# Patient Record
Sex: Female | Born: 1945
Health system: Southern US, Community
[De-identification: ages and names within clinical notes are randomized; demographics above are authoritative.]

## PROBLEM LIST (undated history)

## (undated) DIAGNOSIS — F32A Depression, unspecified: Secondary | ICD-10-CM

## (undated) DIAGNOSIS — J40 Bronchitis, not specified as acute or chronic: Secondary | ICD-10-CM

## (undated) DIAGNOSIS — J449 Chronic obstructive pulmonary disease, unspecified: Secondary | ICD-10-CM

## (undated) DIAGNOSIS — F419 Anxiety disorder, unspecified: Secondary | ICD-10-CM

## (undated) DIAGNOSIS — I10 Essential (primary) hypertension: Secondary | ICD-10-CM

## (undated) DIAGNOSIS — T675XXA Heat exhaustion, unspecified, initial encounter: Secondary | ICD-10-CM

## (undated) DIAGNOSIS — E785 Hyperlipidemia, unspecified: Secondary | ICD-10-CM

## (undated) DIAGNOSIS — D649 Anemia, unspecified: Secondary | ICD-10-CM

## (undated) DIAGNOSIS — F329 Major depressive disorder, single episode, unspecified: Secondary | ICD-10-CM

## (undated) HISTORY — DX: Major depressive disorder, single episode, unspecified: F32.9

## (undated) HISTORY — DX: Depression, unspecified: F32.A

## (undated) HISTORY — DX: Anxiety disorder, unspecified: F41.9

## (undated) HISTORY — PX: TONSILLECTOMY: SUR1361

## (undated) HISTORY — PX: CHOLECYSTECTOMY: SHX55

## (undated) HISTORY — PX: MULTIPLE TOOTH EXTRACTIONS: SHX2053

## (undated) HISTORY — DX: Hyperlipidemia, unspecified: E78.5

---

## 1998-09-03 ENCOUNTER — Encounter: Admission: RE | Admit: 1998-09-03 | Discharge: 1998-09-03 | Payer: Self-pay | Admitting: Internal Medicine

## 1998-10-04 ENCOUNTER — Other Ambulatory Visit: Admission: RE | Admit: 1998-10-04 | Discharge: 1998-10-04 | Payer: Self-pay | Admitting: Obstetrics

## 1998-10-04 ENCOUNTER — Encounter: Admission: RE | Admit: 1998-10-04 | Discharge: 1998-10-04 | Payer: Self-pay | Admitting: Obstetrics & Gynecology

## 1998-10-10 ENCOUNTER — Encounter: Payer: Self-pay | Admitting: Obstetrics & Gynecology

## 1998-10-10 ENCOUNTER — Ambulatory Visit (HOSPITAL_COMMUNITY): Admission: RE | Admit: 1998-10-10 | Discharge: 1998-10-10 | Payer: Self-pay | Admitting: Obstetrics & Gynecology

## 1998-10-14 ENCOUNTER — Ambulatory Visit (HOSPITAL_COMMUNITY): Admission: RE | Admit: 1998-10-14 | Discharge: 1998-10-14 | Payer: Self-pay

## 1998-10-25 ENCOUNTER — Other Ambulatory Visit: Admission: RE | Admit: 1998-10-25 | Discharge: 1998-10-25 | Payer: Self-pay | Admitting: Obstetrics & Gynecology

## 1998-10-25 ENCOUNTER — Encounter: Admission: RE | Admit: 1998-10-25 | Discharge: 1998-10-25 | Payer: Self-pay | Admitting: Obstetrics & Gynecology

## 1999-01-22 ENCOUNTER — Encounter: Admission: RE | Admit: 1999-01-22 | Discharge: 1999-01-22 | Payer: Self-pay | Admitting: Internal Medicine

## 1999-03-14 ENCOUNTER — Encounter: Admission: RE | Admit: 1999-03-14 | Discharge: 1999-03-14 | Payer: Self-pay | Admitting: Internal Medicine

## 1999-04-09 ENCOUNTER — Encounter: Admission: RE | Admit: 1999-04-09 | Discharge: 1999-04-09 | Payer: Self-pay | Admitting: Internal Medicine

## 1999-07-26 ENCOUNTER — Emergency Department (HOSPITAL_COMMUNITY): Admission: EM | Admit: 1999-07-26 | Discharge: 1999-07-26 | Payer: Self-pay | Admitting: Emergency Medicine

## 1999-09-28 ENCOUNTER — Emergency Department (HOSPITAL_COMMUNITY): Admission: EM | Admit: 1999-09-28 | Discharge: 1999-09-28 | Payer: Self-pay | Admitting: Emergency Medicine

## 1999-10-10 ENCOUNTER — Ambulatory Visit (HOSPITAL_COMMUNITY): Admission: RE | Admit: 1999-10-10 | Discharge: 1999-10-10 | Payer: Self-pay | Admitting: Internal Medicine

## 1999-10-10 ENCOUNTER — Encounter: Admission: RE | Admit: 1999-10-10 | Discharge: 1999-10-10 | Payer: Self-pay | Admitting: Internal Medicine

## 1999-10-10 ENCOUNTER — Encounter: Payer: Self-pay | Admitting: Internal Medicine

## 1999-10-29 ENCOUNTER — Ambulatory Visit (HOSPITAL_COMMUNITY): Admission: RE | Admit: 1999-10-29 | Discharge: 1999-10-29 | Payer: Self-pay | Admitting: *Deleted

## 2000-03-11 ENCOUNTER — Other Ambulatory Visit: Admission: RE | Admit: 2000-03-11 | Discharge: 2000-03-11 | Payer: Self-pay | Admitting: Obstetrics & Gynecology

## 2000-03-11 ENCOUNTER — Encounter: Admission: RE | Admit: 2000-03-11 | Discharge: 2000-03-11 | Payer: Self-pay | Admitting: Internal Medicine

## 2000-03-15 ENCOUNTER — Ambulatory Visit (HOSPITAL_COMMUNITY): Admission: RE | Admit: 2000-03-15 | Discharge: 2000-03-15 | Payer: Self-pay | Admitting: Internal Medicine

## 2000-03-25 ENCOUNTER — Encounter: Payer: Self-pay | Admitting: Internal Medicine

## 2000-03-25 ENCOUNTER — Ambulatory Visit (HOSPITAL_COMMUNITY): Admission: RE | Admit: 2000-03-25 | Discharge: 2000-03-25 | Payer: Self-pay | Admitting: Internal Medicine

## 2000-04-14 ENCOUNTER — Encounter: Admission: RE | Admit: 2000-04-14 | Discharge: 2000-04-14 | Payer: Self-pay | Admitting: Hematology and Oncology

## 2000-04-21 ENCOUNTER — Ambulatory Visit (HOSPITAL_COMMUNITY): Admission: RE | Admit: 2000-04-21 | Discharge: 2000-04-21 | Payer: Self-pay | Admitting: *Deleted

## 2000-04-30 ENCOUNTER — Encounter: Admission: RE | Admit: 2000-04-30 | Discharge: 2000-04-30 | Payer: Self-pay | Admitting: Internal Medicine

## 2000-06-04 ENCOUNTER — Encounter: Admission: RE | Admit: 2000-06-04 | Discharge: 2000-06-04 | Payer: Self-pay | Admitting: Internal Medicine

## 2000-08-17 ENCOUNTER — Encounter: Admission: RE | Admit: 2000-08-17 | Discharge: 2000-08-17 | Payer: Self-pay | Admitting: Internal Medicine

## 2000-09-22 ENCOUNTER — Encounter: Admission: RE | Admit: 2000-09-22 | Discharge: 2000-09-22 | Payer: Self-pay | Admitting: Internal Medicine

## 2000-11-01 ENCOUNTER — Encounter: Admission: RE | Admit: 2000-11-01 | Discharge: 2000-11-01 | Payer: Self-pay | Admitting: Infectious Diseases

## 2000-11-17 ENCOUNTER — Ambulatory Visit (HOSPITAL_COMMUNITY): Admission: RE | Admit: 2000-11-17 | Discharge: 2000-11-17 | Payer: Self-pay | Admitting: *Deleted

## 2001-02-16 ENCOUNTER — Encounter: Admission: RE | Admit: 2001-02-16 | Discharge: 2001-02-16 | Payer: Self-pay | Admitting: *Deleted

## 2001-03-04 ENCOUNTER — Encounter: Admission: RE | Admit: 2001-03-04 | Discharge: 2001-03-04 | Payer: Self-pay | Admitting: Internal Medicine

## 2001-05-11 ENCOUNTER — Emergency Department (HOSPITAL_COMMUNITY): Admission: EM | Admit: 2001-05-11 | Discharge: 2001-05-11 | Payer: Self-pay | Admitting: Emergency Medicine

## 2001-06-10 ENCOUNTER — Encounter (INDEPENDENT_AMBULATORY_CARE_PROVIDER_SITE_OTHER): Payer: Self-pay | Admitting: *Deleted

## 2001-06-10 ENCOUNTER — Other Ambulatory Visit: Admission: RE | Admit: 2001-06-10 | Discharge: 2001-06-10 | Payer: Self-pay | Admitting: Internal Medicine

## 2001-06-10 ENCOUNTER — Encounter: Admission: RE | Admit: 2001-06-10 | Discharge: 2001-06-10 | Payer: Self-pay | Admitting: Internal Medicine

## 2001-07-22 ENCOUNTER — Emergency Department (HOSPITAL_COMMUNITY): Admission: EM | Admit: 2001-07-22 | Discharge: 2001-07-22 | Payer: Self-pay | Admitting: Emergency Medicine

## 2001-07-22 ENCOUNTER — Encounter: Payer: Self-pay | Admitting: Emergency Medicine

## 2001-08-03 ENCOUNTER — Encounter: Payer: Self-pay | Admitting: Emergency Medicine

## 2001-08-03 ENCOUNTER — Emergency Department (HOSPITAL_COMMUNITY): Admission: EM | Admit: 2001-08-03 | Discharge: 2001-08-03 | Payer: Self-pay | Admitting: Emergency Medicine

## 2001-08-15 ENCOUNTER — Ambulatory Visit (HOSPITAL_COMMUNITY): Admission: RE | Admit: 2001-08-15 | Discharge: 2001-08-15 | Payer: Self-pay | Admitting: Internal Medicine

## 2001-08-15 ENCOUNTER — Encounter: Payer: Self-pay | Admitting: Internal Medicine

## 2001-08-15 ENCOUNTER — Encounter: Admission: RE | Admit: 2001-08-15 | Discharge: 2001-08-15 | Payer: Self-pay | Admitting: Internal Medicine

## 2001-08-17 ENCOUNTER — Ambulatory Visit (HOSPITAL_COMMUNITY): Admission: RE | Admit: 2001-08-17 | Discharge: 2001-08-17 | Payer: Self-pay | Admitting: *Deleted

## 2001-09-14 ENCOUNTER — Encounter: Admission: RE | Admit: 2001-09-14 | Discharge: 2001-09-14 | Payer: Self-pay | Admitting: Internal Medicine

## 2001-10-10 ENCOUNTER — Encounter: Admission: RE | Admit: 2001-10-10 | Discharge: 2001-10-10 | Payer: Self-pay | Admitting: Internal Medicine

## 2001-11-21 ENCOUNTER — Ambulatory Visit (HOSPITAL_COMMUNITY): Admission: RE | Admit: 2001-11-21 | Discharge: 2001-11-21 | Payer: Self-pay | Admitting: *Deleted

## 2001-12-07 ENCOUNTER — Encounter: Admission: RE | Admit: 2001-12-07 | Discharge: 2001-12-07 | Payer: Self-pay | Admitting: Internal Medicine

## 2002-01-23 ENCOUNTER — Encounter: Admission: RE | Admit: 2002-01-23 | Discharge: 2002-01-23 | Payer: Self-pay | Admitting: Internal Medicine

## 2002-01-27 ENCOUNTER — Encounter (INDEPENDENT_AMBULATORY_CARE_PROVIDER_SITE_OTHER): Payer: Self-pay | Admitting: *Deleted

## 2002-02-06 ENCOUNTER — Encounter: Admission: RE | Admit: 2002-02-06 | Discharge: 2002-02-06 | Payer: Self-pay | Admitting: Internal Medicine

## 2002-05-04 ENCOUNTER — Encounter: Admission: RE | Admit: 2002-05-04 | Discharge: 2002-05-04 | Payer: Self-pay | Admitting: Internal Medicine

## 2002-05-18 ENCOUNTER — Encounter: Payer: Self-pay | Admitting: Emergency Medicine

## 2002-05-18 ENCOUNTER — Emergency Department (HOSPITAL_COMMUNITY): Admission: EM | Admit: 2002-05-18 | Discharge: 2002-05-18 | Payer: Self-pay | Admitting: Emergency Medicine

## 2002-06-05 ENCOUNTER — Encounter: Admission: RE | Admit: 2002-06-05 | Discharge: 2002-06-05 | Payer: Self-pay | Admitting: Internal Medicine

## 2002-10-12 ENCOUNTER — Encounter: Admission: RE | Admit: 2002-10-12 | Discharge: 2002-10-12 | Payer: Self-pay | Admitting: Internal Medicine

## 2003-04-11 ENCOUNTER — Encounter: Admission: RE | Admit: 2003-04-11 | Discharge: 2003-04-11 | Payer: Self-pay | Admitting: Internal Medicine

## 2003-08-15 ENCOUNTER — Encounter: Admission: RE | Admit: 2003-08-15 | Discharge: 2003-08-15 | Payer: Self-pay | Admitting: Internal Medicine

## 2003-09-19 ENCOUNTER — Encounter: Admission: RE | Admit: 2003-09-19 | Discharge: 2003-09-19 | Payer: Self-pay | Admitting: Internal Medicine

## 2003-10-03 ENCOUNTER — Encounter: Admission: RE | Admit: 2003-10-03 | Discharge: 2003-10-03 | Payer: Self-pay | Admitting: Internal Medicine

## 2003-11-08 DIAGNOSIS — J019 Acute sinusitis, unspecified: Secondary | ICD-10-CM

## 2003-11-08 DIAGNOSIS — K219 Gastro-esophageal reflux disease without esophagitis: Secondary | ICD-10-CM

## 2003-11-08 DIAGNOSIS — F329 Major depressive disorder, single episode, unspecified: Secondary | ICD-10-CM | POA: Insufficient documentation

## 2003-11-08 DIAGNOSIS — Z8742 Personal history of other diseases of the female genital tract: Secondary | ICD-10-CM

## 2003-11-21 ENCOUNTER — Ambulatory Visit: Payer: Self-pay | Admitting: Internal Medicine

## 2003-11-21 DIAGNOSIS — IMO0002 Reserved for concepts with insufficient information to code with codable children: Secondary | ICD-10-CM

## 2003-12-05 ENCOUNTER — Ambulatory Visit: Payer: Self-pay | Admitting: Internal Medicine

## 2003-12-12 ENCOUNTER — Ambulatory Visit (HOSPITAL_COMMUNITY): Admission: RE | Admit: 2003-12-12 | Discharge: 2003-12-12 | Payer: Self-pay | Admitting: Internal Medicine

## 2004-04-29 ENCOUNTER — Ambulatory Visit: Payer: Self-pay | Admitting: Internal Medicine

## 2004-04-29 DIAGNOSIS — E785 Hyperlipidemia, unspecified: Secondary | ICD-10-CM

## 2004-11-19 ENCOUNTER — Ambulatory Visit: Payer: Self-pay | Admitting: Internal Medicine

## 2005-01-05 ENCOUNTER — Emergency Department (HOSPITAL_COMMUNITY): Admission: EM | Admit: 2005-01-05 | Discharge: 2005-01-05 | Payer: Self-pay | Admitting: Family Medicine

## 2005-01-06 ENCOUNTER — Ambulatory Visit (HOSPITAL_COMMUNITY): Admission: RE | Admit: 2005-01-06 | Discharge: 2005-01-06 | Payer: Self-pay | Admitting: Family Medicine

## 2005-01-06 ENCOUNTER — Ambulatory Visit (HOSPITAL_COMMUNITY): Admission: RE | Admit: 2005-01-06 | Discharge: 2005-01-06 | Payer: Self-pay | Admitting: Internal Medicine

## 2005-01-14 ENCOUNTER — Ambulatory Visit: Payer: Self-pay | Admitting: Internal Medicine

## 2005-03-26 ENCOUNTER — Ambulatory Visit: Payer: Self-pay | Admitting: Internal Medicine

## 2005-04-10 ENCOUNTER — Ambulatory Visit: Payer: Self-pay | Admitting: Internal Medicine

## 2005-05-04 ENCOUNTER — Ambulatory Visit: Payer: Self-pay | Admitting: Internal Medicine

## 2005-05-06 ENCOUNTER — Ambulatory Visit (HOSPITAL_COMMUNITY): Admission: RE | Admit: 2005-05-06 | Discharge: 2005-05-06 | Payer: Self-pay | Admitting: Internal Medicine

## 2005-05-06 ENCOUNTER — Encounter (INDEPENDENT_AMBULATORY_CARE_PROVIDER_SITE_OTHER): Payer: Self-pay | Admitting: *Deleted

## 2005-11-10 ENCOUNTER — Ambulatory Visit: Payer: Self-pay | Admitting: Hospitalist

## 2005-11-23 ENCOUNTER — Ambulatory Visit: Payer: Self-pay | Admitting: Internal Medicine

## 2005-11-23 ENCOUNTER — Encounter (INDEPENDENT_AMBULATORY_CARE_PROVIDER_SITE_OTHER): Payer: Self-pay | Admitting: *Deleted

## 2005-11-23 LAB — CONVERTED CEMR LAB
Cholesterol: 364 mg/dL
LDL Cholesterol: 261 mg/dL

## 2005-12-08 ENCOUNTER — Ambulatory Visit: Payer: Self-pay | Admitting: Internal Medicine

## 2006-01-05 ENCOUNTER — Encounter (INDEPENDENT_AMBULATORY_CARE_PROVIDER_SITE_OTHER): Payer: Self-pay | Admitting: *Deleted

## 2006-01-08 ENCOUNTER — Ambulatory Visit: Payer: Self-pay | Admitting: Internal Medicine

## 2006-02-18 ENCOUNTER — Ambulatory Visit: Payer: Self-pay | Admitting: Internal Medicine

## 2006-02-18 ENCOUNTER — Ambulatory Visit (HOSPITAL_COMMUNITY): Admission: RE | Admit: 2006-02-18 | Discharge: 2006-02-18 | Payer: Self-pay | Admitting: Internal Medicine

## 2006-02-26 ENCOUNTER — Encounter: Payer: Self-pay | Admitting: *Deleted

## 2006-02-26 DIAGNOSIS — F411 Generalized anxiety disorder: Secondary | ICD-10-CM | POA: Insufficient documentation

## 2006-02-26 DIAGNOSIS — D509 Iron deficiency anemia, unspecified: Secondary | ICD-10-CM

## 2006-03-16 HISTORY — PX: POLYPECTOMY: SHX149

## 2006-03-24 ENCOUNTER — Ambulatory Visit: Payer: Self-pay | Admitting: Hospitalist

## 2006-03-24 ENCOUNTER — Encounter (INDEPENDENT_AMBULATORY_CARE_PROVIDER_SITE_OTHER): Payer: Self-pay | Admitting: *Deleted

## 2006-03-24 ENCOUNTER — Ambulatory Visit: Payer: Self-pay | Admitting: Sports Medicine

## 2006-03-24 DIAGNOSIS — N852 Hypertrophy of uterus: Secondary | ICD-10-CM

## 2006-03-31 ENCOUNTER — Ambulatory Visit (HOSPITAL_COMMUNITY): Admission: RE | Admit: 2006-03-31 | Discharge: 2006-03-31 | Payer: Self-pay | Admitting: *Deleted

## 2006-04-08 ENCOUNTER — Telehealth (INDEPENDENT_AMBULATORY_CARE_PROVIDER_SITE_OTHER): Payer: Self-pay | Admitting: *Deleted

## 2006-04-27 ENCOUNTER — Encounter: Payer: Self-pay | Admitting: Licensed Clinical Social Worker

## 2006-05-03 ENCOUNTER — Ambulatory Visit: Payer: Self-pay | Admitting: Internal Medicine

## 2006-05-03 DIAGNOSIS — E46 Unspecified protein-calorie malnutrition: Secondary | ICD-10-CM

## 2006-06-10 ENCOUNTER — Encounter (INDEPENDENT_AMBULATORY_CARE_PROVIDER_SITE_OTHER): Payer: Self-pay | Admitting: *Deleted

## 2006-06-10 ENCOUNTER — Telehealth: Payer: Self-pay | Admitting: *Deleted

## 2006-06-10 ENCOUNTER — Ambulatory Visit: Payer: Self-pay | Admitting: Family Medicine

## 2006-06-14 ENCOUNTER — Telehealth: Payer: Self-pay | Admitting: *Deleted

## 2006-06-14 ENCOUNTER — Encounter: Payer: Self-pay | Admitting: Licensed Clinical Social Worker

## 2006-06-15 ENCOUNTER — Telehealth: Payer: Self-pay | Admitting: Licensed Clinical Social Worker

## 2006-08-04 ENCOUNTER — Ambulatory Visit (HOSPITAL_COMMUNITY): Admission: RE | Admit: 2006-08-04 | Discharge: 2006-08-04 | Payer: Self-pay | Admitting: Obstetrics and Gynecology

## 2006-08-12 ENCOUNTER — Ambulatory Visit: Payer: Self-pay | Admitting: Obstetrics & Gynecology

## 2006-08-26 ENCOUNTER — Encounter: Payer: Self-pay | Admitting: Obstetrics & Gynecology

## 2006-08-26 ENCOUNTER — Ambulatory Visit: Payer: Self-pay | Admitting: Obstetrics & Gynecology

## 2006-08-26 ENCOUNTER — Ambulatory Visit (HOSPITAL_COMMUNITY): Admission: RE | Admit: 2006-08-26 | Discharge: 2006-08-26 | Payer: Self-pay | Admitting: Obstetrics & Gynecology

## 2006-12-22 ENCOUNTER — Telehealth: Payer: Self-pay | Admitting: *Deleted

## 2007-02-21 ENCOUNTER — Telehealth: Payer: Self-pay | Admitting: Licensed Clinical Social Worker

## 2007-02-23 ENCOUNTER — Encounter: Payer: Self-pay | Admitting: Licensed Clinical Social Worker

## 2007-02-23 ENCOUNTER — Telehealth (INDEPENDENT_AMBULATORY_CARE_PROVIDER_SITE_OTHER): Payer: Self-pay | Admitting: *Deleted

## 2007-02-23 ENCOUNTER — Ambulatory Visit: Payer: Self-pay | Admitting: Internal Medicine

## 2007-02-28 ENCOUNTER — Telehealth: Payer: Self-pay | Admitting: Licensed Clinical Social Worker

## 2007-03-30 ENCOUNTER — Telehealth (INDEPENDENT_AMBULATORY_CARE_PROVIDER_SITE_OTHER): Payer: Self-pay | Admitting: Pharmacy Technician

## 2007-05-09 ENCOUNTER — Telehealth (INDEPENDENT_AMBULATORY_CARE_PROVIDER_SITE_OTHER): Payer: Self-pay | Admitting: *Deleted

## 2007-08-01 ENCOUNTER — Telehealth (INDEPENDENT_AMBULATORY_CARE_PROVIDER_SITE_OTHER): Payer: Self-pay | Admitting: *Deleted

## 2007-08-10 ENCOUNTER — Telehealth (INDEPENDENT_AMBULATORY_CARE_PROVIDER_SITE_OTHER): Payer: Self-pay | Admitting: *Deleted

## 2007-08-15 ENCOUNTER — Ambulatory Visit: Payer: Self-pay | Admitting: *Deleted

## 2007-11-07 ENCOUNTER — Telehealth: Payer: Self-pay | Admitting: *Deleted

## 2008-01-06 ENCOUNTER — Telehealth: Payer: Self-pay | Admitting: *Deleted

## 2008-01-22 IMAGING — CR DG FOOT COMPLETE 3+V*R*
3 series · 3 of 3 positions shown · non-contrast
Comparison: none

CLINICAL DATA: Foot pain after fall.
 RIGHT FOOT ? 3 VIEW:

[t foot ap right]
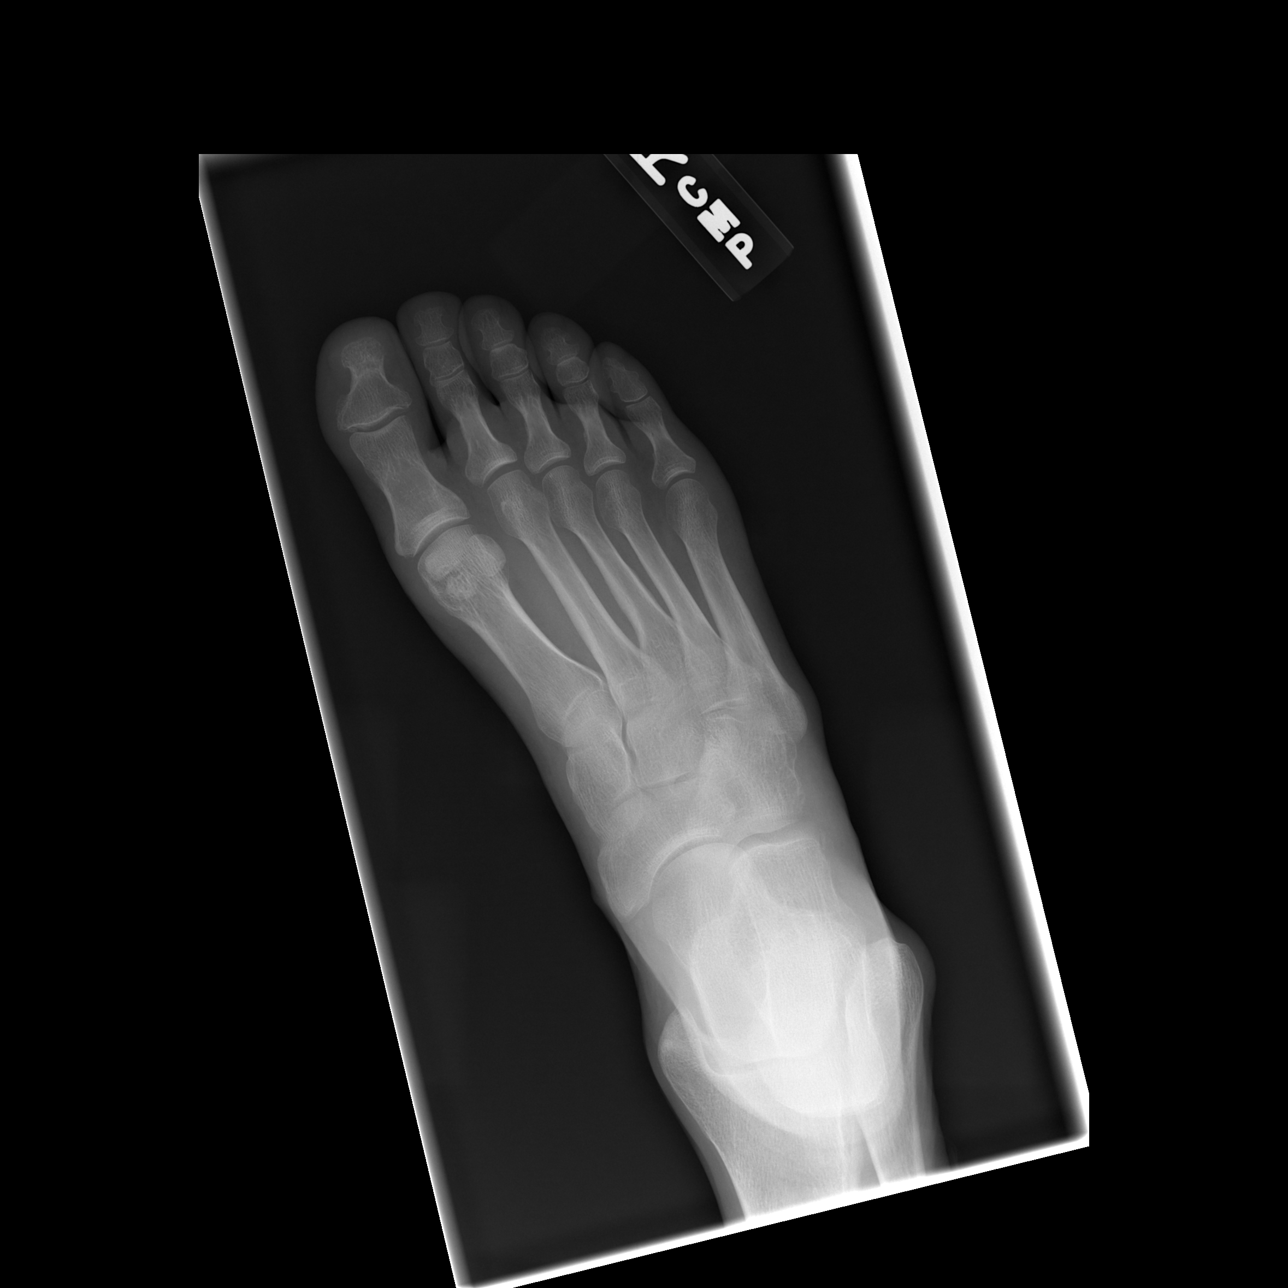

[t foot oblique right]
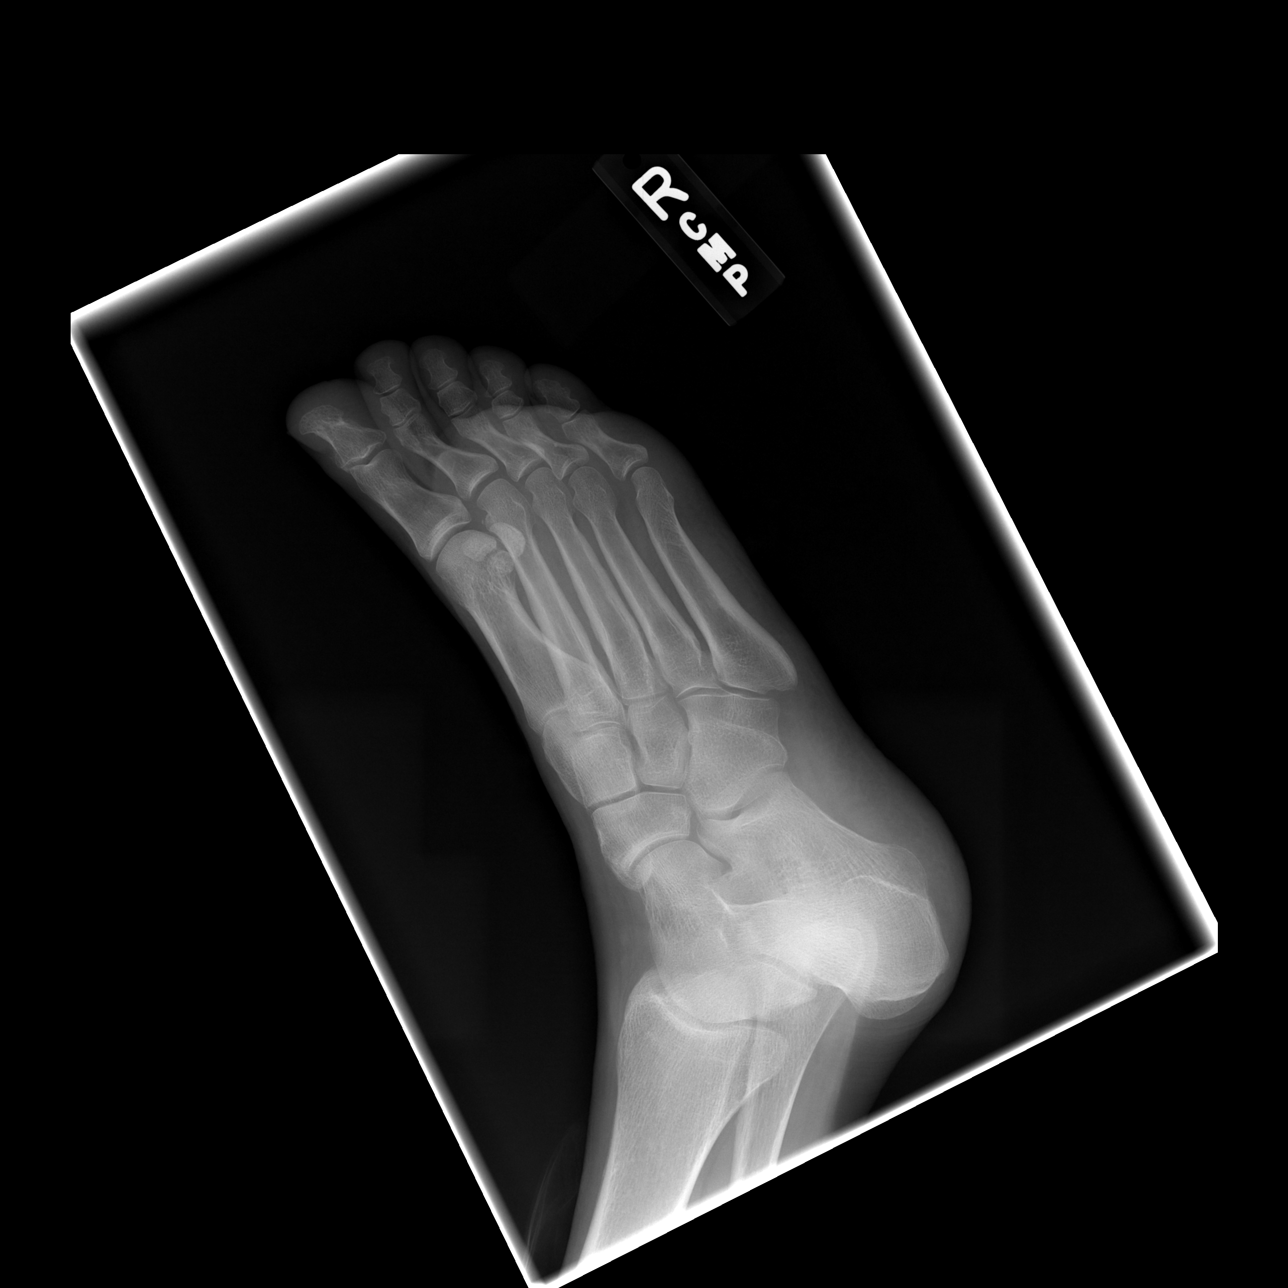

[t foot lat right]
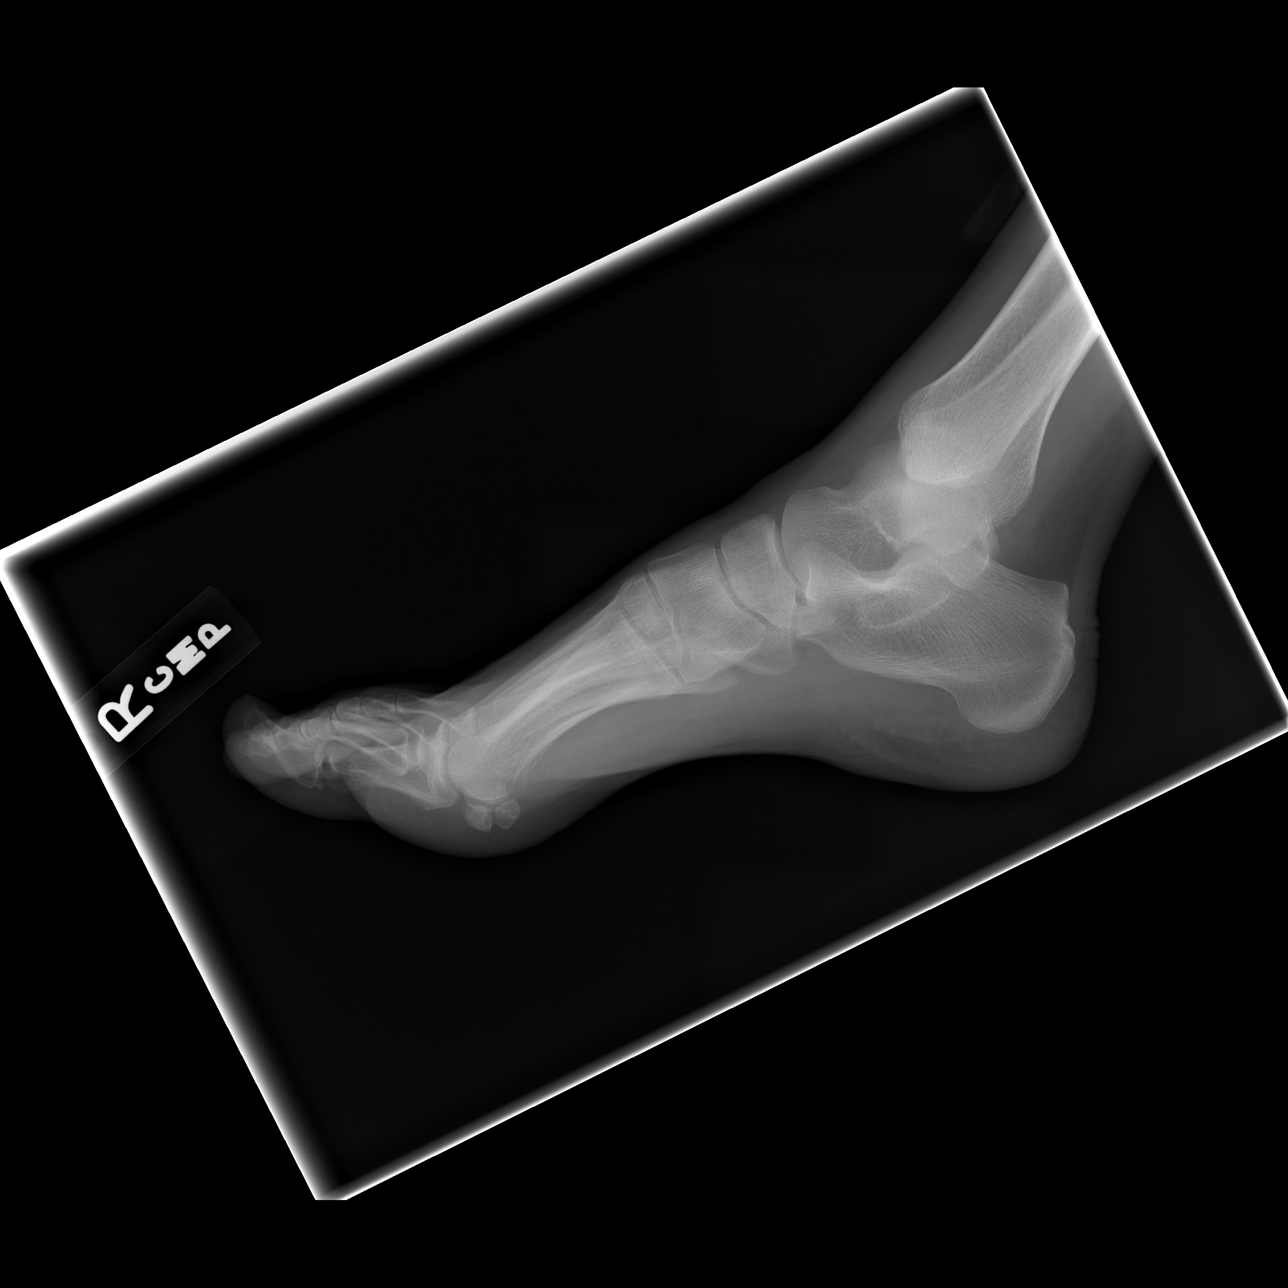

[3 of 3 positions shown; findings below may reference images not displayed]

FINDINGS: No definite fracture.  There is a small bony density along the lateral margin of the calcaneus adjacent to the calcanea cuboid joint.  This could be a small avulsion fracture or a chronic nontraumatic calcification.
IMPRESSION: See above report.

## 2008-02-16 ENCOUNTER — Encounter (INDEPENDENT_AMBULATORY_CARE_PROVIDER_SITE_OTHER): Payer: Self-pay | Admitting: Internal Medicine

## 2008-02-16 ENCOUNTER — Ambulatory Visit: Payer: Self-pay | Admitting: Internal Medicine

## 2008-02-16 ENCOUNTER — Ambulatory Visit (HOSPITAL_COMMUNITY): Admission: RE | Admit: 2008-02-16 | Discharge: 2008-02-16 | Payer: Self-pay | Admitting: Internal Medicine

## 2008-02-16 DIAGNOSIS — R000 Tachycardia, unspecified: Secondary | ICD-10-CM

## 2008-02-16 LAB — CBC AND DIFFERENTIAL: WBC: 6.9 10^3/mL

## 2008-02-16 LAB — BASIC METABOLIC PANEL
BUN: 7 mg/dL (ref 4–21)
Glucose: 66 mg/dL
Sodium: 138 mmol/L (ref 137–147)

## 2008-02-16 LAB — TSH: TSH: 2.9 u[IU]/mL (ref <=5.90)

## 2008-02-21 ENCOUNTER — Encounter: Payer: Self-pay | Admitting: Licensed Clinical Social Worker

## 2008-04-11 ENCOUNTER — Ambulatory Visit: Payer: Self-pay | Admitting: Internal Medicine

## 2008-04-11 ENCOUNTER — Telehealth (INDEPENDENT_AMBULATORY_CARE_PROVIDER_SITE_OTHER): Payer: Self-pay | Admitting: Internal Medicine

## 2008-04-11 ENCOUNTER — Encounter (INDEPENDENT_AMBULATORY_CARE_PROVIDER_SITE_OTHER): Payer: Self-pay | Admitting: Internal Medicine

## 2008-04-11 ENCOUNTER — Telehealth: Payer: Self-pay | Admitting: *Deleted

## 2008-04-11 DIAGNOSIS — H00019 Hordeolum externum unspecified eye, unspecified eyelid: Secondary | ICD-10-CM | POA: Insufficient documentation

## 2008-04-11 DIAGNOSIS — L0202 Furuncle of face: Secondary | ICD-10-CM

## 2008-04-11 DIAGNOSIS — L0203 Carbuncle of face: Secondary | ICD-10-CM

## 2008-04-12 ENCOUNTER — Telehealth (INDEPENDENT_AMBULATORY_CARE_PROVIDER_SITE_OTHER): Payer: Self-pay | Admitting: Internal Medicine

## 2008-05-22 ENCOUNTER — Ambulatory Visit: Payer: Self-pay | Admitting: Internal Medicine

## 2008-05-30 ENCOUNTER — Ambulatory Visit (HOSPITAL_COMMUNITY): Admission: RE | Admit: 2008-05-30 | Discharge: 2008-05-30 | Payer: Self-pay | Admitting: Internal Medicine

## 2008-05-30 LAB — HM MAMMOGRAPHY: HM Mammogram: NEGATIVE

## 2008-06-19 ENCOUNTER — Telehealth (INDEPENDENT_AMBULATORY_CARE_PROVIDER_SITE_OTHER): Payer: Self-pay | Admitting: Internal Medicine

## 2008-06-27 ENCOUNTER — Ambulatory Visit: Payer: Self-pay | Admitting: Internal Medicine

## 2008-07-27 ENCOUNTER — Ambulatory Visit: Payer: Self-pay | Admitting: *Deleted

## 2008-07-27 ENCOUNTER — Other Ambulatory Visit: Admission: RE | Admit: 2008-07-27 | Discharge: 2008-07-27 | Payer: Self-pay | Admitting: Internal Medicine

## 2008-07-27 ENCOUNTER — Encounter (INDEPENDENT_AMBULATORY_CARE_PROVIDER_SITE_OTHER): Payer: Self-pay | Admitting: Internal Medicine

## 2008-08-27 ENCOUNTER — Telehealth (INDEPENDENT_AMBULATORY_CARE_PROVIDER_SITE_OTHER): Payer: Self-pay | Admitting: Internal Medicine

## 2009-01-21 ENCOUNTER — Telehealth (INDEPENDENT_AMBULATORY_CARE_PROVIDER_SITE_OTHER): Payer: Self-pay | Admitting: Internal Medicine

## 2009-01-21 ENCOUNTER — Telehealth: Payer: Self-pay | Admitting: Licensed Clinical Social Worker

## 2009-07-08 ENCOUNTER — Encounter: Payer: Self-pay | Admitting: Licensed Clinical Social Worker

## 2009-07-08 ENCOUNTER — Ambulatory Visit: Payer: Self-pay | Admitting: Internal Medicine

## 2009-08-08 ENCOUNTER — Emergency Department (HOSPITAL_COMMUNITY): Admission: EM | Admit: 2009-08-08 | Discharge: 2009-08-08 | Payer: Self-pay | Admitting: Family Medicine

## 2009-08-22 ENCOUNTER — Telehealth (INDEPENDENT_AMBULATORY_CARE_PROVIDER_SITE_OTHER): Payer: Self-pay | Admitting: Internal Medicine

## 2009-08-23 ENCOUNTER — Telehealth: Payer: Self-pay | Admitting: Licensed Clinical Social Worker

## 2009-08-23 ENCOUNTER — Ambulatory Visit: Payer: Self-pay | Admitting: Internal Medicine

## 2009-09-02 ENCOUNTER — Telehealth (INDEPENDENT_AMBULATORY_CARE_PROVIDER_SITE_OTHER): Payer: Self-pay | Admitting: Internal Medicine

## 2009-09-02 ENCOUNTER — Encounter: Payer: Self-pay | Admitting: Internal Medicine

## 2009-09-09 ENCOUNTER — Telehealth: Payer: Self-pay | Admitting: Internal Medicine

## 2009-09-09 ENCOUNTER — Telehealth: Payer: Self-pay | Admitting: Licensed Clinical Social Worker

## 2009-09-10 ENCOUNTER — Telehealth: Payer: Self-pay | Admitting: Internal Medicine

## 2009-09-12 ENCOUNTER — Ambulatory Visit: Payer: Self-pay | Admitting: Internal Medicine

## 2009-09-12 ENCOUNTER — Telehealth: Payer: Self-pay | Admitting: Internal Medicine

## 2009-09-12 DIAGNOSIS — L02429 Furuncle of limb, unspecified: Secondary | ICD-10-CM

## 2009-09-12 DIAGNOSIS — L02439 Carbuncle of limb, unspecified: Secondary | ICD-10-CM

## 2009-10-01 ENCOUNTER — Encounter: Payer: Self-pay | Admitting: Licensed Clinical Social Worker

## 2009-10-08 ENCOUNTER — Ambulatory Visit: Payer: Self-pay | Admitting: Internal Medicine

## 2009-10-09 LAB — CONVERTED CEMR LAB
Cholesterol: 255 mg/dL — ABNORMAL HIGH (ref 0–200)
HDL: 32 mg/dL — ABNORMAL LOW (ref >39)
LDL Cholesterol: 169 mg/dL — ABNORMAL HIGH (ref 0–99)
Total CHOL/HDL Ratio: 8
Triglycerides: 271 mg/dL — ABNORMAL HIGH (ref <150)
VLDL: 54 mg/dL — ABNORMAL HIGH (ref 0–40)

## 2009-10-21 ENCOUNTER — Encounter: Payer: Self-pay | Admitting: Internal Medicine

## 2009-11-05 ENCOUNTER — Encounter: Payer: Self-pay | Admitting: Internal Medicine

## 2009-11-05 ENCOUNTER — Telehealth: Payer: Self-pay | Admitting: Licensed Clinical Social Worker

## 2009-11-06 ENCOUNTER — Telehealth: Payer: Self-pay | Admitting: Licensed Clinical Social Worker

## 2009-11-11 ENCOUNTER — Telehealth: Payer: Self-pay | Admitting: Internal Medicine

## 2009-11-11 ENCOUNTER — Telehealth: Payer: Self-pay | Admitting: Licensed Clinical Social Worker

## 2009-11-12 ENCOUNTER — Encounter: Payer: Self-pay | Admitting: Licensed Clinical Social Worker

## 2009-12-26 ENCOUNTER — Ambulatory Visit (HOSPITAL_COMMUNITY): Admission: RE | Admit: 2009-12-26 | Discharge: 2009-12-26 | Payer: Self-pay | Admitting: Internal Medicine

## 2009-12-26 ENCOUNTER — Telehealth: Payer: Self-pay | Admitting: Internal Medicine

## 2009-12-26 ENCOUNTER — Ambulatory Visit: Payer: Self-pay | Admitting: Internal Medicine

## 2009-12-26 DIAGNOSIS — L989 Disorder of the skin and subcutaneous tissue, unspecified: Secondary | ICD-10-CM | POA: Insufficient documentation

## 2009-12-26 DIAGNOSIS — R079 Chest pain, unspecified: Secondary | ICD-10-CM

## 2010-01-14 ENCOUNTER — Telehealth: Payer: Self-pay | Admitting: Licensed Clinical Social Worker

## 2010-02-14 ENCOUNTER — Telehealth: Payer: Self-pay | Admitting: Licensed Clinical Social Worker

## 2010-04-13 LAB — CONVERTED CEMR LAB
ALT: 12 units/L (ref 0–35)
AST: 18 units/L (ref 0–37)
Albumin: 4.5 g/dL (ref 3.5–5.2)
CO2: 27 meq/L (ref 19–32)
Calcium: 9.7 mg/dL (ref 8.4–10.5)
Chloride: 100 meq/L (ref 96–112)
Creatinine, Ser: 0.88 mg/dL (ref 0.40–1.20)
HCT: 41.8 % (ref 36.0–46.0)
MCV: 90.5 fL (ref 78.0–100.0)
Platelets: 275 10*3/uL (ref 150–400)
RBC: 4.62 M/uL (ref 3.87–5.11)
TSH: 2.905 microintl units/mL (ref 0.350–4.50)
Total Protein: 7.3 g/dL (ref 6.0–8.3)
WBC: 6.9 10*3/uL (ref 4.0–10.5)

## 2010-04-17 NOTE — Miscellaneous (Signed)
Summary: DDS  DDS   Imported By: Louretta Parma 10/24/2009 10:47:53  _____________________________________________________________________  External Attachment:    Type:   Image     Comment:   External Document
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
## 2010-04-17 NOTE — Progress Notes (Signed)
Summary: Social Work  Nurse, children's placed by: Social Work Summary of Call: Health and safety inspector at [employer]. Center to f/u on disability referral.   Said he did not get referral as far as he could tell.  I refaxed and told him that Shalini is homeless right now and in need of assistance in filing asap

## 2010-04-17 NOTE — Progress Notes (Signed)
Summary: Soc. Work  Engineer, building services of Call: 15 min.  Patient is homeless and needing support and housing resources.    She is temporariliy staying at a friend's home who is in the hospital and she is watching his dog.  Patient's SSI is pending.
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
## 2010-04-17 NOTE — Progress Notes (Signed)
Summary: med change/gp  Phone Note Refill Request Message from:  Pharmacy on December 26, 2009 3:26 PM  Refills Requested: Medication #1:  PROTONIX 40 MG TBEC Take 1 tablet by mouth once a day.  Protonix is not covered by pt.'s Medicaid; can u change to another medication.   Method Requested: Electronic Initial call taken by: Chinita Pester RN,  December 26, 2009 3:27 PM  Follow-up for Phone Call        Refill approved-nurse to complete Follow-up by: Clerance Lav MD,  December 26, 2009 3:40 PM    New/Updated Medications: OMEPRAZOLE 40 MG CPDR (OMEPRAZOLE) Take 1 tablet by mouth once a day Prescriptions: OMEPRAZOLE 40 MG CPDR (OMEPRAZOLE) Take 1 tablet by mouth once a day  #30 x 3   Entered and Authorized by:   Clerance Lav MD   Signed by:   Clerance Lav MD on 12/26/2009   Method used:   Electronically to        [facility] [address] [phone]* (retail)       [address]       [place], [place]  [address]       Ph: [phone] or [phone]       Fax: [phone]   RxID:   [id number]
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
## 2010-04-17 NOTE — Consult Note (Signed)
Summary: DISABILITY INTAKE REFERRAL  DISABILITY INTAKE REFERRAL   Imported By: Margie Billet 09/13/2009 11:10:26  _____________________________________________________________________  External Attachment:    Type:   Image     Comment:   External Document

## 2010-04-17 NOTE — Progress Notes (Signed)
Summary: Refill/gh  Phone Note Refill Request Message from:  Fax from Pharmacy on September 10, 2009 10:23 AM  Refills Requested: Medication #1:  PRAVACHOL 40 MG TABS Take one tablet two times a day for cholesterol.   Last Refilled: 07/27/2009  Method Requested: Electronic Initial call taken by: Angelina Ok RN,  September 10, 2009 10:23 AM  Follow-up for Phone Call        completed refill, thank you Esmeralda Malay  Follow-up by: Mliss Sax MD,  September 10, 2009 10:38 AM    Prescriptions: PRAVACHOL 40 MG TABS (PRAVASTATIN SODIUM) Take one tablet two times a day for cholesterol.  #32 x 11   Entered and Authorized by:   Mliss Sax MD   Signed by:   Mliss Sax MD on 09/10/2009   Method used:   Electronically to        [facility] [address] (retail)       [phone]       [place], [place]  [address]       Ph: [phone]       Fax: [phone]   RxID:   [id number]

## 2010-04-17 NOTE — Letter (Signed)
Summary: MENTAL HEALTH   MENTAL HEALTH   Imported By: Margie Billet 09/19/2009 13:54:57  _____________________________________________________________________  External Attachment:    Type:   Image     Comment:   External Document
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
## 2010-04-17 NOTE — Progress Notes (Signed)
Summary: REfill/gh  Phone Note Refill Request Message from:  Fax from Pharmacy on November 11, 2009 3:23 PM  Refills Requested: Medication #1:  ALPRAZOLAM 1 MG TABS Take 1 tablet by mouth three times a day as needed for anxiety   Last Refilled: 11/03/2009  Method Requested: Electronic Initial call taken by: Angelina Ok RN,  November 11, 2009 3:23 PM  Follow-up for Phone Call        completed refill, thank you Iskra  Follow-up by: Mliss Sax MD,  November 12, 2009 3:37 PM    Prescriptions: ALPRAZOLAM 1 MG TABS (ALPRAZOLAM) Take 1 tablet by mouth three times a day as needed for anxiety  #90 x 3   Entered and Authorized by:   Mliss Sax MD   Signed by:   Mliss Sax MD on 11/12/2009   Method used:   Telephoned to ...       [facility] [address] (retail)       [phone]       [place], [place]  [address]       Ph: [phone]       Fax: [phone]   RxID:   [id number]

## 2010-04-17 NOTE — Progress Notes (Signed)
    Appended Document:  5 min. Called Therese Sarah at the [facility] to let her know that Shawna Hawkins was approved for SSI and to thank them for their help. Gunnar Fusi said that Gaile's early retirement would be combined in the payment.  SSI is $764 per month.

## 2010-04-17 NOTE — Progress Notes (Signed)
Summary: Soc. 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  She reports she has seen therapist at [facility]. Services 5 x and been diagnosed with depression, anxiety, panic attacks, PTSD, grief and loss.  She is linked with [facility] but does not feel safe there at the homeless day center.  She has her disability phone report on Wednesday.    I told her to keep going as she is doing all the right things for her disability.  We will write letter of support on her behalf for disability.    Envelope left with nurse for [facility].  $30 in [employer] cards and copay money.   SW F/U.

## 2010-04-17 NOTE — Miscellaneous (Signed)
Summary: Orders Update  Clinical Lists Changes  Orders: Added new Referral order of Social Work Referral (Social ) - Signed 

## 2010-04-17 NOTE — Miscellaneous (Signed)
Summary: Disability  30 minutes.  Disability.  Completion of function report.  Mailed to Disability Determination/[place].

## 2010-04-17 NOTE — Assessment & Plan Note (Signed)
Summary: cyst/anxiety/gg   Vital Signs:  Patient profile:   65 year old female Height:      63.5 inches (161.29 cm) Weight:      99.7 pounds (45.32 kg) BMI:     17.45 Temp:     97.7 degrees F (36.50 degrees C) oral Pulse rate:   94 / minute BP sitting:   122 / 69  (right arm)  Vitals Entered By: Stanton Kidney Ditzler RN (August 23, 2009 4:20 PM) Is Patient Diabetic? No Pain Assessment Patient in pain? no      Nutritional Status BMI of < 19 = underweight Nutritional Status Detail appetite down  Have you ever been in a relationship where you felt threatened, hurt or afraid?denies   Does patient need assistance? Functional Status Self care Ambulation Normal Comments Wants antidepressants. Going to [facility] services. Son died 7 years ago. Ck under left arm.   Primary Care Provider:  Elby Showers MD   History of Present Illness: Shawna Hawkins comes today for followings:  1. Depression: see below.   2. Axilla wound: She has a bursted open wound on left axilla started about 10 days ago. Now she has some swelling. No fever/chills. She has no pain. She is concerned that she may have staph infection.   3. Anxiety: She is anxious about not getting her meds from mental health. Her counseller asked her to start on antidepressants. Her mood is not good. She enjoys reading sometimes. She is not socializing as before. No SI. Her son died 7 yrs ago and she doesnot get along with her older son. She is furstrated about this and she is still not able to get over this. Her best friend sister is also detached with her emotionally. Her dog was lost recently. She separated from her husband few years.   Depression History:      The patient denies a depressed mood most of the day and a diminished interest in her usual daily activities.         Preventive Screening-Counseling & Management  Alcohol-Tobacco     Alcohol drinks/day: 0     Smoking Status: current     Smoking Cessation Counseling: yes  Packs/Day: <0.25     Year Started: 20 years ago  Caffeine-Diet-Exercise     Does Patient Exercise: no     Type of exercise: walking     Times/week: 1 day  Current Medications (verified): 1)  Alprazolam 1 Mg Tabs (Alprazolam) .... Take 1 Tablet By Mouth Three Times A Day As Needed For Anxiety 2)  Fish Oil Concentrate 300 Mg Caps (Omega-3 Fatty Acids) .... Take One Capsule Two Times A Day To Reduce Cholesterol. 3)  Pravachol 40 Mg Tabs (Pravastatin Sodium) .... Take One Tablet Two Times A Day For Cholesterol. 4)  Prozac 10 Mg Caps (Fluoxetine Hcl) .... Take 1 Pill By Mouth Daily.  Allergies: 1)  ! Codeine Sulfate (Codeine Sulfate) 2)  ! Penicillin 3)  ! Percodan (Oxycodone-Aspirin) 4)  ! Percocet (Oxycodone-Acetaminophen) 5)  ! Hydroxyzine Hcl (Hydroxyzine Hcl)  Review of Systems      See HPI  Physical Exam  Mouth:  pharynx pink and moist.   Lungs:  normal breath sounds, no crackles, and no wheezes.   Heart:  normal rate, regular rhythm, no murmur, and no gallop.   Extremities:  trace left pedal edema and trace right pedal edema.   Neurologic:  alert & oriented X3.   Skin:  Left axilla: 2-3 evidence of folliculits seen.  Impression & Recommendations:  Problem # 1:  DEPRESSION (ICD-311) See HPI. Will start her on prozac, which is in health dept. Will f/u in a month to see the response to treatment and can increase the dose if needed.   Her updated medication list for this problem includes:    Alprazolam 1 Mg Tabs (Alprazolam) .Marland Kitchen... Take 1 tablet by mouth three times a day as needed for anxiety    Prozac 10 Mg Caps (Fluoxetine hcl) .Marland Kitchen... Take 1 pill by mouth daily.  Problem # 2:  CARBUNCLE AND FURUNCLE OF FACE (ICD-680.0) This time of left axilla. I think this is just folliculitis. I will not treat this with antibiotics and if it worsens, we can consider this.   Complete Medication List: 1)  Alprazolam 1 Mg Tabs (Alprazolam) .... Take 1 tablet by mouth three times a day  as needed for anxiety 2)  Fish Oil Concentrate 300 Mg Caps (Omega-3 fatty acids) .... Take one capsule two times a day to reduce cholesterol. 3)  Pravachol 40 Mg Tabs (Pravastatin sodium) .... Take one tablet two times a day for cholesterol. 4)  Prozac 10 Mg Caps (Fluoxetine hcl) .... Take 1 pill by mouth daily.  Patient Instructions: 1)  Please schedule a follow-up appointment in 1 month. 2)  Limit your Sodium (Salt) to less than 2 grams a day(slightly less than 1/2 a teaspoon) to prevent fluid retention, swelling, or worsening of symptoms. 3)  It is important that you exercise regularly at least 20 minutes 5 times a week. If you develop chest pain, have severe difficulty breathing, or feel very tired , stop exercising immediately and seek medical attention. 4)  It is not healthy  for men to drink more than 2-3 drinks per day or for women to drink more than 1-2 drinks per day. Prescriptions: PROZAC 10 MG CAPS (FLUOXETINE HCL) take 1 pill by mouth daily.  #30 x 1   Entered and Authorized by:   Jason Coop MD   Signed by:   Jason Coop MD on 08/23/2009   Method used:   Print then Give to Patient   RxID:   [id number]    Prevention & Chronic Care Immunizations   Influenza vaccine: received  (01/08/2006)   Influenza vaccine deferral: Deferred  (07/08/2009)    Tetanus booster: Not documented   Td booster deferral: Deferred  (07/08/2009)    Pneumococcal vaccine: Not documented    H. zoster vaccine: Not documented   H. zoster vaccine deferral: Deferred  (07/08/2009)  Colorectal Screening   Hemoccult: Not documented    Colonoscopy: Not documented  Other Screening   Pap smear: NEGATIVE FOR INTRAEPITHELIAL LESIONS OR MALIGNANCY.  (07/27/2008)    Mammogram: ASSESSMENT: Negative - BI-RADS 1^MM DIGITAL SCREENING  (05/30/2008)   Mammogram action/deferral: Ordered  (07/08/2009)    DXA bone density scan: Not documented   Smoking status: current  (08/23/2009)    Smoking cessation counseling: yes  (08/23/2009)  Lipids   Total Cholesterol: 408  (02/16/2008)   LDL: 314  (02/16/2008)   LDL Direct: Not documented   HDL: 37  (02/16/2008)   Triglycerides: 284  (02/16/2008)    SGOT (AST): 18  (02/16/2008)   SGPT (ALT): 12  (02/16/2008)   Alkaline phosphatase: 86  (02/16/2008)   Total bilirubin: 0.5  (02/16/2008)  Self-Management Support :   Personal Goals (by the next clinic visit) :      Personal LDL goal: 100  (08/23/2009)    Patient will work on the  following items until the next clinic visit to reach self-care goals:     Medications and monitoring: take my medicines every day, bring all of my medications to every visit  (08/23/2009)     Eating: eat more vegetables, use fresh or frozen vegetables, eat fruit for snacks and desserts  (08/23/2009)     Activity: take a 30 minute walk every day  (08/23/2009)    Lipid self-management support: Written self-care plan, Education handout, Resources for patients handout  (08/23/2009)   Lipid self-care plan printed.   Lipid education handout printed      Resource handout printed.

## 2010-04-17 NOTE — Progress Notes (Signed)
Summary: phone/gg  Phone Note Call from Patient   Caller: Patient Complaint: Nausea/Vomiting/Diarrhea Summary of Call: Pt called with concerns about boils? under arm.  She was treated with  antibiotic  on 6/20 with some improvement. Will see this week to evaluate and see if more meds needed. please note last lipids drawn 2009 with elevations.   Initial call taken by: Merrie Roof RN,  September 09, 2009 11:58 AM  Follow-up for Phone Call        Noted. Follow-up by: Zoila Shutter MD,  September 09, 2009 3:23 PM  Additional Follow-up for Phone Call Additional follow up Details #1::        Noted Additional Follow-up by: Mariea Stable MD,  September 12, 2009 9:22 AM

## 2010-04-17 NOTE — Progress Notes (Signed)
Summary: phone/gg  Phone Note Call from Patient   Caller: Patient Summary of Call: Pt called and wants to be seen for anxiety.  She is homeless, no place to go during these hot days.  She is seen by  family services on mondays. Pt has no thoughts of suicide. yesterday she had several hours of panic attack , She took alprazolam 1/2 tab x2 yesterday. she is afraid to take more because she will just sleep and this is not safe on street. Please advise. Initial call taken by: Merrie Roof RN,  August 22, 2009 12:38 PM  Follow-up for Phone Call        please have her call the [facility].  [address]. phone is [phone].  They may let people in during the day or have some ideas for her.  Additional Follow-up for Phone Call Additional follow up Details #1::        Pt informed and has used this service in past.   appointment made for Monday as she has ? abscess  under arm.  I suggested [facility] if she wants to be seen today.  Also Dorothe Pea to call pt Additional Follow-up by: Merrie Roof RN,  August 23, 2009 9:43 AM

## 2010-04-17 NOTE — Progress Notes (Signed)
  Phone Note Outgoing Call   Summary of Call: F/u phone w/ Drucie Ip.   She is still homeless and staying with friends who will take her in.  Her Section 8 housing is still in process and has not heard any word on that.   Her check is just under $800 from [employer].   October is anniversary of her son's death and she is trying to do things that her son would have liked like getting out in nature.  She also has a laptop and can get on the web anywhere that has wifi.   Has furniture from a friend to furnish her apt once she finds a place with her Sec. 8 housing.   Soc. Work will call and find out about her Sec. 8 housing.   Still involved with counseling at [facility]. Services/Stephen Terranova.        Appended Document:  I called over to [employer] and they were not much help in terms of Sec[employer] 8 other than to say there is a wait list and they will call clients once they reach the top of the wait list.  Right now there is no Sec. 8 housing available.

## 2010-04-17 NOTE — Progress Notes (Signed)
Summary: correct pravachol/ hla  Phone Note From Pharmacy   Summary of Call: [facility] called concerning the pravachol script, did not find it on medication list, read through notes, found script of 6/28, it was called in as written and left on voicemail at pharmacy, 1 twice daily, #32. paged dr Aldine Contes and ask about script, she states it should have been 1 once daily #32, she is ask to add to med list and correct, she states she will do, pharmacist is made aware. Initial call taken by: Marin Roberts RN,  September 18, 2009 9:56 AM    New/Updated Medications: PRAVACHOL 40 MG TABS (PRAVASTATIN SODIUM) Take 1 tab by mouth at bedtime Prescriptions: PRAVACHOL 40 MG TABS (PRAVASTATIN SODIUM) Take 1 tab by mouth at bedtime  #30 x 3   Entered and Authorized by:   Mliss Sax MD   Signed by:   Mliss Sax MD on 09/20/2009   Method used:   Electronically to        [facility] [address] (retail)       [phone]       [place], [place]  [address]       Ph: [phone]       Fax: [phone]   RxID:   [id number]

## 2010-04-17 NOTE — Assessment & Plan Note (Signed)
Summary: ACUTE-HERNIA/HURTING ON AND OFF([facility])/CFB   Vital Signs:  Patient profile:   65 year old female Height:      63.5 inches (161.29 cm) Weight:      101.1 pounds (43.86 kg) BMI:     17.69 Temp:     96.9 degrees F (36.06 degrees C) oral Pulse rate:   92 / minute BP sitting:   102 / 69  (left arm) Cuff size:   regular  Vitals Entered By: Theotis Barrio NT II (December 26, 2009 1:41 PM) CC: PATIENT STATES SHE FELL ( DON'T QUITE REMEMBERING FALLING) TWISTING HER BODY, Depression Is Patient Diabetic? No Pain Assessment Patient in pain? yes     Location: ALL OVER Intensity:      5 Type: DULL ALL THE TIMES Onset of pain  FOR ABOUT 2 WEEKS AGO Nutritional Status BMI of < 19 = underweight  Have you ever been in a relationship where you felt threatened, hurt or afraid?No   Does patient need assistance? Functional Status Self care Ambulation Normal Comments PATIENT STATES SHE IS LIVING FROM HOUSE TO  CAR TO HOUSE ( FRIENDS)   Primary Care Provider:  Mliss Sax MD  CC:  PATIENT STATES SHE FELL ( DON'T QUITE REMEMBERING FALLING) TWISTING HER BODY and Depression.  History of Present Illness: 65 yo female with PMH outlined below presents to [facility] for pain in epigastric area, chest and back. She is homeless and goes from house to house.  No recent sicknesses or hospitalizaitons. No  SOB, palpitations, no fever or chills. No specific abdominal or urinary concerns. No recent changes in appetite, weight, sleep patterns, mood. Needs refill on her meds.   Depression History:      The patient denies a depressed mood most of the day and a diminished interest in her usual daily activities.        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   Preventive Screening-Counseling & Management  Alcohol-Tobacco     Alcohol drinks/day: 0     Smoking Status: current     Smoking Cessation Counseling: yes     Packs/Day: 1/2 ppd     Year Started: 20 years  ago  Caffeine-Diet-Exercise     Does Patient Exercise: yes     Type of exercise: walking     Times/week: 1 day  Allergies: 1)  ! Codeine Sulfate (Codeine Sulfate) 2)  ! Penicillin 3)  ! Percodan (Oxycodone-Aspirin) 4)  ! Percocet (Oxycodone-Acetaminophen) 5)  ! Hydroxyzine Hcl (Hydroxyzine Hcl)  Social History: Does Patient Exercise:  yes   Impression & Recommendations:  Problem # 1:  HYPERLIPIDEMIA, HX OF (ICD-272.4) stable, no myalgias reported. continue to take medicines as prescirbed.  Her updated medication list for this problem includes:    Pravachol 40 Mg Tabs (Pravastatin sodium) .Marland Kitchen... Take 1 tab by mouth at bedtime  Labs Reviewed: SGOT: 18 (02/16/2008)   SGPT: 12 (02/16/2008)   HDL:32 (10/08/2009), 37 (02/16/2008)  LDL:169 (10/08/2009), 314 (02/16/2008)  Chol:255 (10/08/2009), 408 (02/16/2008)  Trig:271 (10/08/2009), 284 (02/16/2008)  Problem # 2:  CARBUNCLE AND FURUNCLE OF UPPER ARM AND FOREARM (ICD-680.3) hx of fururncle but at present all resolved.   Problem # 3:  ANXIETY (ICD-300.00) stable, usually worse in 01-18-2023 when her son died. no changes made Her updated medication list for this problem includes:    Alprazolam 1 Mg Tabs (Alprazolam) .Marland Kitchen... Take 1 tablet by mouth three times a day as needed for anxiety    Prozac 20 Mg  Caps (Fluoxetine hcl) .Marland Kitchen... Take 1 tablet by mouth once a day  Problem # 4:  TOBACCO ABUSE (ICD-305.1) advised on quiting, she has not been able to. she is not ready yet.   Problem # 5:  GASTROESOPHAGEAL REFLUX DISEASE, MILD (ICD-530.81) severe GERD. her protonix was denied. New prescription for [place] written.  Her updated medication list for this problem includes:    Protonix 40 Mg Tbec (Pantoprazole sodium) .Marland Kitchen... Take 1 tablet by mouth once a day  Problem # 6:  CHEST PAIN UNSPECIFIED (ICD-786.50) CXR ordered to ensure on rib injury. She has poor nutrition and at risk of osteoprosis and fracture.  Orders: Radiology other  (Radiology Other)  Problem # 7:  SKIN LESION (ICD-709.9) Difficult to assess her skin lesions as none are active. She might need a biopsy. I advised to take the picture with cell phone when they are active. She will be made a referral for further review. Check for HIV serology on next exam.  Orders: Dermatology Referral (Derma)  Complete Medication List: 1)  Alprazolam 1 Mg Tabs (Alprazolam) .... Take 1 tablet by mouth three times a day as needed for anxiety 2)  Fish Oil Concentrate 300 Mg Caps (Omega-3 fatty acids) .... Take one capsule two times a day to reduce cholesterol. 3)  Prozac 20 Mg Caps (Fluoxetine hcl) .... Take 1 tablet by mouth once a day 4)  Pravachol 40 Mg Tabs (Pravastatin sodium) .... Take 1 tab by mouth at bedtime 5)  Benadryl 25 Mg Tabs (Diphenhydramine hcl) .... Take 1 tablet by mouth two times a day as needed 6)  Protonix 40 Mg Tbec (Pantoprazole sodium) .... Take 1 tablet by mouth once a day  Other Orders: Influenza Vaccine NON MCR (16109)  Patient Instructions: 1)  Please schedule a follow-up appointment in 1 month. Prescriptions: PROTONIX 40 MG TBEC (PANTOPRAZOLE SODIUM) Take 1 tablet by mouth once a day  #30 x 3   Entered and Authorized by:   Clerance Lav MD   Signed by:   Clerance Lav MD on 12/26/2009   Method used:   Print then Give to Patient   RxID:   [id number]    Prevention & Chronic Care Immunizations   Influenza vaccine: Fluvax Non-MCR  (12/26/2009)   Influenza vaccine deferral: Not indicated  (10/08/2009)    Tetanus booster: Not documented   Td booster deferral: Not indicated  (10/08/2009)    Pneumococcal vaccine: Not documented    H. zoster vaccine: Not documented   H. zoster vaccine deferral: Not indicated  (10/08/2009)  Colorectal Screening   Hemoccult: Not documented   Hemoccult action/deferral: Not indicated  (10/08/2009)    Colonoscopy: Not documented   Colonoscopy action/deferral: Not indicated  (10/08/2009)  Other  Screening   Pap smear: NEGATIVE FOR INTRAEPITHELIAL LESIONS OR MALIGNANCY.  (07/27/2008)   Pap smear action/deferral: Deferred-3 yr interval  (10/08/2009)    Mammogram: ASSESSMENT: Negative - BI-RADS 1^MM DIGITAL SCREENING  (05/30/2008)   Mammogram action/deferral: Deferred-2 yr interval  (10/08/2009)    DXA bone density scan: Not documented   Smoking status: current  (12/26/2009)   Smoking cessation counseling: yes  (12/26/2009)  Lipids   Total Cholesterol: 255  (10/08/2009)   LDL: 169  (10/08/2009)   LDL Direct: Not documented   HDL: 32  (10/08/2009)   Triglycerides: 271  (10/08/2009)    SGOT (AST): 18  (02/16/2008)   BMP action: Ordered   SGPT (ALT): 12  (02/16/2008)   Alkaline phosphatase: 86  (02/16/2008)  Total bilirubin: 0.5  (02/16/2008)  Self-Management Support :   Personal Goals (by the next clinic visit) :      Personal LDL goal: 100  (08/23/2009)    Patient will work on the following items until the next clinic visit to reach self-care goals:     Medications and monitoring: take my medicines every day  (12/26/2009)     Eating: drink diet soda or water instead of juice or soda, eat more vegetables, use fresh or frozen vegetables, limit or avoid alcohol  (12/26/2009)     Activity: take a 30 minute walk every day  (12/26/2009)    Lipid self-management support: Resources for patients handout  (12/26/2009)        Resource handout printed.   Influenza Vaccine    Vaccine Type: Fluvax Non-MCR    Site: left deltoid    Mfr: GlaxoSmithKline    Dose: 0.5 ml    Route: IM    Given by: Chinita Pester RN    Exp. Date: 09/13/2010    Lot #: [id number]    VIS given: 10/08/09 version given December 26, 2009.  Flu Vaccine Consent Questions    Do you have a history of severe allergic reactions to this vaccine? no    Any prior history of allergic reactions to egg and/or gelatin? no    Do you have a sensitivity to the preservative Thimersol? no    Do you have a past history  of Guillan-Barre Syndrome? no    Do you currently have an acute febrile illness? no    Have you ever had a severe reaction to latex? no    Vaccine information given and explained to patient? yes    Are you currently pregnant? no
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
## 2010-04-17 NOTE — Assessment & Plan Note (Signed)
Summary: Soc. Work  30 minutes.  Soc. Work.  Met with patient for update and housing resources.  Patient now has Disabilty and Medicaid.   Patient is still seeing Derrill Memo at [facility] and he will now be able to bill under Medicaid per the patient.  She still likes meeting with [facility] for therapy weekly.  There may be a slight delay in weekly services due to converting her case to Medicaid per patient.   Disability is just under $700.  She recd two months of backpay. She will be homeless by Thursday and gets paid Disability on the 1st of Sept.  She has also met with a Estate manager/land agent today to report her new income and continue her foodstamps.   I have strongly encouraged her to go to [facility] for further planning re: her housing and I have given her that information.  She is not interested in [address] or [address] at this time.  I also gave her information re: [address]/low income housing.   I will email her information on [email or web address] where she can look for affordable housing options here in [place].    Cont SW follow-up.
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
## 2010-04-17 NOTE — Progress Notes (Signed)
Summary: phone/gg  Phone Note Other Incoming   Caller: Patient Summary of Call: Patient called stating she has 5 more boils under her arm and doesn't know what to do.  She has a hx of staph infection.  I told her I would let Triage know.  Patient also extremely anxious about her disability claim and I told her I would get her some help with that today.  I will also write a letter in support of her disability.   Follow-up for Phone Call        Pt called in today stating she has noted several ? cyst under left arm.  They are small and sore.  will you treat? the original cyst still hurts. She has little $$ needs Rx at [facility] if they have it or [facility].  Hx of staph infection.  Pt # [phone] Follow-up by: Merrie Roof RN,  September 02, 2009 5:06 PM    New/Updated Medications: DOXYCYCLINE HYCLATE 100 MG TABS (DOXYCYCLINE HYCLATE) take 1 pill by mouth two times a day for 7 days Prescriptions: DOXYCYCLINE HYCLATE 100 MG TABS (DOXYCYCLINE HYCLATE) take 1 pill by mouth two times a day for 7 days  #14 x 0   Entered and Authorized by:   Jason Coop MD   Signed by:   Jason Coop MD on 09/03/2009   Method used:   Telephoned to ...       [facility] [address] (retail)       [phone]       [place], [place]  [address]       Ph: [phone]       Fax: [phone]   RxID:   [id number]   Appended Document: phone/gg Rx called in Pt informed She will call is not better.

## 2010-04-22 ENCOUNTER — Other Ambulatory Visit: Payer: Self-pay | Admitting: Internal Medicine

## 2010-05-11 ENCOUNTER — Encounter: Payer: Self-pay | Admitting: Internal Medicine

## 2010-05-21 ENCOUNTER — Other Ambulatory Visit: Payer: Self-pay | Admitting: *Deleted

## 2010-05-26 MED ORDER — ALPRAZOLAM 1 MG PO TABS: 1.0000 mg | ORAL_TABLET | Freq: Three times a day (TID) | ORAL | Status: DC | PRN

## 2010-05-27 ENCOUNTER — Other Ambulatory Visit: Payer: Self-pay | Admitting: Internal Medicine

## 2010-05-28 NOTE — Telephone Encounter (Signed)
Alprazolam rx called to [facility] pharmacy.

## 2010-06-22 ENCOUNTER — Other Ambulatory Visit: Payer: Self-pay | Admitting: Internal Medicine

## 2010-07-29 NOTE — H&P (Signed)
NAME:  MICHELE, JUDY NO.:  [id number]   MEDICAL RECORD NO.:  [id number]          PATIENT TYPE:  WOC   LOCATION:  WOC                          FACILITY:  [facility]   PHYSICIAN:  Allie Bossier, MD        DATE OF BIRTH:  1946-02-21   DATE OF ADMISSION:  08/12/2006  DATE OF DISCHARGE:                              HISTORY & PHYSICAL   Ms. Redcay is a 65 year old, separated, white, gravida 5, para 3,  abortus 2 who was seen her in the Gyn Clinic as a referral from [facility] for a subjectively enlarged uterus.  An  ultrasound was done which showed an 8 mm polyp.  This ultrasound finding  was noted in January 2008.  A followup ultrasound here this month (Aug 04, 2006) showed no change.  She denies any vaginal bleeding, but wishes  to have it removed for definitive diagnosis.   PAST MEDICAL HISTORY:  Significant for significant depression and panic  attacks, high cholesterol and a history of pneumonia.  She also is  underweight.   PAST SURGICAL HISTORY:  She has had elective AB, cholecystectomy and  tonsillectomy.   REVIEW OF SYSTEMS:  She is abstinent.  She currently lives with a friend  but is essentially homeless.  Latex allergies:  No.   ALLERGIES:  CODEINE, PERCODAN, ASPIRIN, PERCOCET, MORPHINE.   PHYSICAL EXAMINATION:  Weight is 96 pounds.  Height 5 foot 4.  General:  Cachectic white female, anxious, crying at times.  HEENT:  Normal.  Heart:  Regular rate and rhythm.  Lungs:  Clear to auscultation  bilaterally.  External genitalia:  Normal.  Uterus:  Normal size and  shape, anteverted, mobile, non-enlarged adnexa.   ASSESSMENT AND PLAN:  Asymptomatic uterine polyp causing the patient  significant anxiety.  We will plan for a dilatation and curettage and  hysteroscopy.  Risks of surgery explained and are accepted.      Allie Bossier, MD  Electronically Signed     MCD/MEDQ  D:  08/12/2006  T:  08/12/2006  Job:  [id number]

## 2010-07-29 NOTE — Op Note (Signed)
Shawna Hawkins, GORIS NO.:  [id number]   MEDICAL RECORD NO.:  [id number]          PATIENT TYPE:  AMB   LOCATION:  SDC                           [facility]  [facility]   PHYSICIAN:  Allie Bossier, MD        DATE OF BIRTH:  [birth year]   DATE OF PROCEDURE:  08/26/2006  DATE OF DISCHARGE:                               OPERATIVE REPORT   PREOPERATIVE DIAGNOSIS:  Asymptomatic uterine polyp.   POSTOPERATIVE DIAGNOSES:  1. Asymptomatic uterine polyp.  2. Stenotic cervix.  3. Vaginal atrophy.  4. Second degree cystocele.   SURGEON:  Nicholaus Bloom, M.D.   ASSISTANT:  Carolanne Grumbling, M.D.   ANESTHESIA:  General.   SPECIMENS:  Polyp to the lab.   EBL:  Minimal.   COMPLICATIONS:  None.   OPERATIVE NOTE:  Patient was taken to the operating room where she was  placed under general anesthesia.  She was prepped and draped in the  usual sterile position.  Bimanual exam revealed a normal size and shape,  anteverted, mobile, non-enlarged uterus.  Speculum was placed.  Vaginal  mucosa was noted to be very atrophic and she had a stenotic cervix.  Tenaculum was placed on the posterior cervix because the anterior cervix  was too stenotic to hold it.  Extra care was taken while dilating her  cervix.  She was dilated to 9.5 Pratt dilator.  Hysteroscopy was  performed.  Severe atrophy was seen throughout the uterus.  There was a  small polyp noted at the fundus.  Hysteroscope was removed and curettage was done, and a small polyp was  seen.  The specimen was sent to lab.  Tenaculum was removed, cervix was  hemostatic.  Speculum was removed.  The patient was taken to the  recovery room in stable condition.  There were no complications.     ______________________________  Marc Morgans Mayford Knife, M.D.      Allie Bossier, MD  Electronically Signed    TLW/MEDQ  D:  08/26/2006  T:  08/26/2006  Job:  [id number]

## 2010-08-01 NOTE — Group Therapy Note (Signed)
NAME:  Shawna Hawkins, Shawna Hawkins NO.:  [id number]   MEDICAL RECORD NO.:  [id number]          PATIENT TYPE:  WOC   LOCATION:  [facility]                   FACILITY:  [facility]   PHYSICIAN:  Tinnie Gens, MD        DATE OF BIRTH:  10-10-1945   DATE OF SERVICE:  06/10/2006                                  CLINIC NOTE   CHIEF COMPLAINT:  Referral for endometrial polyp.   HISTORY OF PRESENT ILLNESS:  A 65 year old white female here after  referral from the [facility] outpatient clinic for an endometrial polyp  that was found on ultrasound that was ordered because on examination the  woman was found to have a subjectively enlarged uterus the size of a  large orange.  The patient denies any postmenopausal bleeding.  She is  postmenopausal since 1984.  She had never taken any hormones.  She has  no complaints of any pelvic pain, etc.  She denies any nausea, vomiting,  diarrhea, constipation.  She denies any abdominal bloating.  She has  actually been gaining weight recently as opposed to losing weight.  She  is a very thin woman as well so this is appropriate for her.   OBJECTIVE:  VITAL SIGNS:  Stable.  GENERAL:  No acute distress.  ABDOMEN:  Soft, nontender, normal active bowel sounds.  PELVIC:  Externally, the vagina appears normal.  There is some mild  atrophy of the vaginal mucosa and loss of rugae.  She has a very small  cervix that has a very small opening.  We attempted to do an endometrial  biopsy but were unable to pass the uterine sound through the cervical os  secondary to cervical stenosis.  It was difficult to palpate her uterus  size because of the anterior abdominal wall was flexed.  There was no  cervical motion tenderness nor adnexal tenderness palpated, however.   ASSESSMENT AND PLAN:  A 65 year old white female with endometrial polyp  on ultrasound:  At this point, since this was found only from a physical  exam and her ultrasound showed the dimensions of the  uterus were  actually relatively normal, we do want to try to avoid any unnecessary  procedures, however, so at this time we will plan on doing an ultrasound  in 2-3 weeks for followup of this.  If at that time the endometrium  appears worse, we could consider doing a D&C.  However, hopefully this  will resolve on its own and will not need further invasive workup.     ______________________________  Angeline Slim, M.D.    ______________________________  Tinnie Gens, MD   AL/MEDQ  D:  06/10/2006  T:  06/10/2006  Job:  [id number]   cc:   [facility]

## 2010-10-13 ENCOUNTER — Telehealth: Payer: Self-pay | Admitting: Licensed Clinical Social Worker

## 2010-10-13 ENCOUNTER — Other Ambulatory Visit: Payer: Self-pay | Admitting: Internal Medicine

## 2010-10-13 MED ORDER — ALPRAZOLAM 1 MG PO TABS: 1.0000 mg | ORAL_TABLET | Freq: Three times a day (TID) | ORAL | Status: DC | PRN

## 2010-10-13 NOTE — Telephone Encounter (Signed)
Xanax and Prozac rxs called to [facility].

## 2010-10-13 NOTE — Telephone Encounter (Signed)
I cannot find why she is on both 1mg  and 0.5mg  xanax.  I will refill both xanax and prozac with no further fefills.  She should be called and told her Aug app't with Sherlon Handing is mandatory.  Be sure to cancel any further refills of the xanax 0.5.

## 2010-10-13 NOTE — Telephone Encounter (Signed)
Pt was only given the Xanax 1 mg one time.  Pharmacy said that they were not given the 3 refill part.  Pt said that she now goes to the [facility] and not the [facility].  York Spaniel that she has a few of the 0.5 mg Xanax left.  They do not work as well. Would like to get refills on the Xanax 1 mg tablets.  Pt has an appointment scheduled with her PCP on 10/22/2010.

## 2010-10-13 NOTE — Telephone Encounter (Signed)
20 minutes.  Extensive phone conversation with patient.  She has still not found permanent housing.  She has finally received her Soc. Security Disability of $700 and now has Medicaid.    I've encouraged her to follow-up with Housing Coalition since she has income to afford housing.  She told me she has been on the Sec 8 wait list for one year and still no word.  I've stressed that housing coalition may be able to find affordable housing that is not section 8 so she needs to call there immediately.   I've also given her info re: [address] [employer] however she is not thrilled with the area they are located even though these are new apartments located in the [place] area.   Patient has also lost her therapist Derrill Memo at [facility] and is reconnecting to new therapist at [facility].   Transit continues to be a problem and I've encouraged her to utilize Medicaid transport at the [phone] number and she had an excuse for not using that due to the advance notice needed.   The bus system apparently is also a problem for her because she has to wait and walk and she feels unsafe and reported that her legs really hurt after walking a mile or so.   Patient said she is also in process of looking for an inexpensive vehicle.  We talked about the expense of having a car including the gas and insurance however she feels that she needs a vehicle.    Continued social work support.

## 2010-10-23 ENCOUNTER — Ambulatory Visit (INDEPENDENT_AMBULATORY_CARE_PROVIDER_SITE_OTHER): Payer: Self-pay | Admitting: Internal Medicine

## 2010-10-23 ENCOUNTER — Encounter: Payer: Self-pay | Admitting: Internal Medicine

## 2010-10-23 VITALS — BP 124/81 | HR 85 | Temp 97.1°F | Ht 63.5 in | Wt 107.3 lb

## 2010-10-23 DIAGNOSIS — K219 Gastro-esophageal reflux disease without esophagitis: Secondary | ICD-10-CM

## 2010-10-23 DIAGNOSIS — E785 Hyperlipidemia, unspecified: Secondary | ICD-10-CM

## 2010-10-23 DIAGNOSIS — F329 Major depressive disorder, single episode, unspecified: Secondary | ICD-10-CM

## 2010-10-23 MED ORDER — OMEPRAZOLE 40 MG PO CPDR: 40.0000 mg | DELAYED_RELEASE_CAPSULE | Freq: Every day | ORAL | Status: DC

## 2010-10-23 MED ORDER — FLUOXETINE HCL 20 MG PO CAPS: 20.0000 mg | ORAL_CAPSULE | Freq: Every day | ORAL | Status: DC

## 2010-10-23 MED ORDER — PRAVASTATIN SODIUM 40 MG PO TABS: 40.0000 mg | ORAL_TABLET | Freq: Every day | ORAL | Status: DC

## 2010-10-23 MED ORDER — ALPRAZOLAM 1 MG PO TABS: 1.0000 mg | ORAL_TABLET | Freq: Three times a day (TID) | ORAL | Status: DC | PRN

## 2010-10-23 NOTE — Assessment & Plan Note (Signed)
Well-controlled on current medication regimen we will continue the same.

## 2010-10-23 NOTE — Assessment & Plan Note (Signed)
We'll check cholesterol panel today and readjust medication regimen is indicated. No changes will be made today.

## 2010-10-23 NOTE — Assessment & Plan Note (Signed)
Well-controlled on current medication regimen. We'll continue the same medicine as same current dose.

## 2010-10-23 NOTE — Progress Notes (Signed)
  Subjective:    Patient ID: Shawna Hawkins, female    DOB: [birth year], 65 y.o.   MRN: [id number]  HPI  Patient is 65 year old female with past medical history outlined below who presents to clinic for regular followup on her blood pressure, cholesterol, and depression. She denies recent sicknesses or hospitalizations, no episodes of chest pain or shortness of breath, no abdominal or urinary concerns. She reports compliance with medications as well as recommended diet and exercise.  Review of Systems Constitutional: Denies fever, chills, diaphoresis, appetite change and fatigue.  HEENT: Denies photophobia, eye pain, redness, hearing loss, ear pain, congestion, sore throat, rhinorrhea, sneezing, mouth sores, trouble swallowing, neck pain, neck stiffness and tinnitus.   Respiratory: Denies SOB, DOE, cough, chest tightness,  and wheezing.   Cardiovascular: Denies chest pain, palpitations and leg swelling.  Gastrointestinal: Denies nausea, vomiting, abdominal pain, diarrhea, constipation, blood in stool and abdominal distention.  Genitourinary: Denies dysuria, urgency, frequency, hematuria, flank pain and difficulty urinating.  Musculoskeletal: Denies myalgias, back pain, joint swelling, arthralgias and gait problem.  Skin: Denies pallor, rash and wound.  Neurological: Denies dizziness, seizures, syncope, weakness, light-headedness, numbness and headaches.  Hematological: Denies adenopathy. Easy bruising, personal or family bleeding history  Psychiatric/Behavioral: Denies suicidal ideation, mood changes, confusion, nervousness, sleep disturbance and agitation      Objective:   Physical Exam Constitutional: Vital signs reviewed.  Patient is a well-developed and well-nourished in no acute distress and cooperative with exam. Alert and oriented x3.  Neck: Supple, Trachea midline normal ROM, No JVD, mass, thyromegaly, or carotid bruit present.  Cardiovascular: RRR, S1 normal, S2 normal, no MRG,  pulses symmetric and intact bilaterally Pulmonary/Chest: CTAB, no wheezes, rales, or rhonchi Abdominal: Soft. Non-tender, non-distended, bowel sounds are normal, no masses, organomegaly, or guarding present. Psychiatric: Normal mood and affect. speech and behavior is normal. Judgment and thought content normal. Cognition and memory are normal.          Assessment & Plan:

## 2010-10-24 LAB — LIPID PANEL
Cholesterol: 307 mg/dL — ABNORMAL HIGH (ref 0–200)
LDL Cholesterol: 208 mg/dL — ABNORMAL HIGH (ref 0–99)
Total CHOL/HDL Ratio: 9.3 Ratio
Triglycerides: 331 mg/dL — ABNORMAL HIGH (ref <150)
VLDL: 66 mg/dL — ABNORMAL HIGH (ref 0–40)

## 2011-01-01 LAB — CBC
Hemoglobin: 14.1
Platelets: 213
RDW: 12.2

## 2011-05-11 ENCOUNTER — Other Ambulatory Visit: Payer: Self-pay | Admitting: Internal Medicine

## 2011-05-11 NOTE — Telephone Encounter (Signed)
New Patient to me.  Former Community education officer patient. I will refill her medication for 1 month and she should make a follow up appointment to see me and meet.  She has no follow up currently scheduled.

## 2011-05-11 NOTE — Telephone Encounter (Signed)
Message sent to front desk for an appt. 

## 2011-05-13 ENCOUNTER — Other Ambulatory Visit: Payer: Self-pay | Admitting: *Deleted

## 2011-05-18 ENCOUNTER — Other Ambulatory Visit: Payer: Self-pay | Admitting: Internal Medicine

## 2011-05-28 ENCOUNTER — Encounter: Payer: Medicaid Other | Admitting: Internal Medicine

## 2011-05-28 ENCOUNTER — Ambulatory Visit (INDEPENDENT_AMBULATORY_CARE_PROVIDER_SITE_OTHER): Payer: Medicaid Other | Admitting: Internal Medicine

## 2011-05-28 ENCOUNTER — Encounter: Payer: Self-pay | Admitting: Internal Medicine

## 2011-05-28 VITALS — BP 132/76 | HR 68 | Temp 96.4°F | Wt 107.3 lb

## 2011-05-28 DIAGNOSIS — K219 Gastro-esophageal reflux disease without esophagitis: Secondary | ICD-10-CM

## 2011-05-28 DIAGNOSIS — F411 Generalized anxiety disorder: Secondary | ICD-10-CM

## 2011-05-28 DIAGNOSIS — L300 Nummular dermatitis: Secondary | ICD-10-CM

## 2011-05-28 DIAGNOSIS — Z1231 Encounter for screening mammogram for malignant neoplasm of breast: Secondary | ICD-10-CM

## 2011-05-28 DIAGNOSIS — F329 Major depressive disorder, single episode, unspecified: Secondary | ICD-10-CM

## 2011-05-28 DIAGNOSIS — Z139 Encounter for screening, unspecified: Secondary | ICD-10-CM

## 2011-05-28 DIAGNOSIS — E785 Hyperlipidemia, unspecified: Secondary | ICD-10-CM

## 2011-05-28 DIAGNOSIS — L259 Unspecified contact dermatitis, unspecified cause: Secondary | ICD-10-CM

## 2011-05-28 MED ORDER — ALPRAZOLAM 1 MG PO TABS: 1.0000 mg | ORAL_TABLET | Freq: Three times a day (TID) | ORAL | Status: DC | PRN

## 2011-05-28 MED ORDER — ROSUVASTATIN CALCIUM 20 MG PO TABS: 20.0000 mg | ORAL_TABLET | Freq: Every day | ORAL | Status: DC

## 2011-05-28 MED ORDER — FLUOCINONIDE-E 0.05 % EX CREA: TOPICAL_CREAM | Freq: Two times a day (BID) | CUTANEOUS | Status: AC

## 2011-05-28 NOTE — Assessment & Plan Note (Addendum)
Still not well controlled despite being on Pravachol 40 mg for several years. Her last lipid panel in August 2012 showed an LDL of 208. I think she needs a stronger medication and will make that change today. -Stop pravastatin -Start Crestor 20 mg by mouth daily -Lifestyle modification, avoiding food such as cheese, shrimp, egg, etc. -Will repeat her cholesterol in 6 months

## 2011-05-28 NOTE — Assessment & Plan Note (Signed)
The appearance of her skin lesion is consistent with nummular dermatitis instead of fungal or ring worm.  Other differential dx include psoriasis which is less likely.   -Will try Lidex cream BID to see if she improves -Stop anti-fungal cream -Will refer to dermatology if not resolved

## 2011-05-28 NOTE — Assessment & Plan Note (Signed)
Stable. Will continue Xanax 1 mg by mouth 3 times a day when necessary.  Prescription given today.

## 2011-05-28 NOTE — Progress Notes (Signed)
HPI:  Shawna Hawkins is a 66 yo W with PMH of hyperlipidemia, anxiety/depression, GERD presents today for followup/refill.  She reports Xanax helps for anxiety/depression in the past 10 years.  Prozac helps her significantly and it is the only medication that works for her.  Her son recently passed away and she is coping with his loss ok.  No other complaints.  She denies suicidal ideation, homicidal, or increased in depression.  She does not want to get any blood work done today. She is due for mammogram and would like to get an appointment for that.  She does not want to do a colonoscopy at this time even though she is 66 yrs old.    Skin infection x 2 years on her hands and shoulder/left arm with patches but denies any pruritis.  She thinks it is a fungal infection and has been using Ketoconazole cream. She has been seen by multiple physicians and nobody seems to know what is exactly her diagnosis.  She is still waiting for dermatology appointment because it is hard to find a dermatologist who accepts medicaid.   Review of Systems: Constitutional: Denies fever, chills, diaphoresis, appetite change and fatigue.  HEENT: Denies photophobia, eye pain, redness, hearing loss, ear pain, congestion, sore throat, rhinorrhea, sneezing, mouth sores, trouble swallowing, neck pain, neck stiffness and tinnitus.  Respiratory: Denies SOB, DOE, cough, chest tightness, and wheezing.  Cardiovascular: Denies chest pain, palpitations and leg swelling.  Gastrointestinal: Denies nausea, vomiting, abdominal pain, diarrhea, constipation, blood in stool and abdominal distention.  Genitourinary: Denies dysuria, urgency, frequency, hematuria, flank pain and difficulty urinating.  Musculoskeletal: Denies myalgias, back pain, joint swelling, arthralgias and gait problem.  Skin: Denies pallor,.  ++ rash on left arm  Neurological: Denies dizziness, seizures, syncope, weakness, light-headedness, numbness and headaches.    Hematological: Denies adenopathy. Easy bruising, personal or family bleeding history  Psychiatric/Behavioral: Denies suicidal ideation, mood changes, confusion, sleep disturbance and agitation.  +anxiety    Physical examination: General: alert, well-developed, and cooperative to examination.  Lungs: normal respiratory effort, no accessory muscle use, normal breath sounds, no crackles, and no wheezes. Heart: normal rate, regular rhythm, no murmur, no gallop, and no rub.  Abdomen: soft, non-tender, normal bowel sounds, no distention, no guarding, no rebound tenderness Psych: Oriented X3, memory intact for recent and remote, normally interactive, good eye contact, + anxious appearing, and not depressed appearing. Very talkative Skin: Left upper arm: one single 2x2cm nummular/erythematous lesion with white crusting.  No drainage, induration, tenderness.  No other lesions noted on her body.

## 2011-05-28 NOTE — Assessment & Plan Note (Signed)
Well controlled. Will continue Prozac 20 mg by mouth daily

## 2011-05-28 NOTE — Patient Instructions (Addendum)
Avoid eating shrimp, cheese, egg because these food will elevate your cholesterol  Stop taking Pravastatin Start taking Crestor 20mg  one tablet daily You can use Lidex cream apply to affected area twice daily Follow up with Dr. Anselm Jungling in 6 months

## 2011-06-09 ENCOUNTER — Other Ambulatory Visit: Payer: Self-pay | Admitting: *Deleted

## 2011-06-09 DIAGNOSIS — E785 Hyperlipidemia, unspecified: Secondary | ICD-10-CM

## 2011-06-09 MED ORDER — ROSUVASTATIN CALCIUM 20 MG PO TABS: 20.0000 mg | ORAL_TABLET | Freq: Every day | ORAL | Status: DC

## 2011-06-09 NOTE — Telephone Encounter (Signed)
Rx called in to pharmacy. 

## 2011-06-15 ENCOUNTER — Telehealth: Payer: Self-pay | Admitting: *Deleted

## 2011-06-15 NOTE — Telephone Encounter (Signed)
Contacted Medicaid to initiate PA for pt's Crestor 20 mg tabs.  Patient had been on pravastatin for several years LDL 208 in August 2012. MD changed to crestor at last visit, but insurance will not pay for this medication unless she fails one more of the preferred medications which includes atorvastatin, simvastatin, and lovastatin.  Will forward to ordering for review.  Please advise.Kingsley Spittle Cassady4/1/20133:44 PM

## 2011-06-24 ENCOUNTER — Ambulatory Visit (HOSPITAL_COMMUNITY)
Admission: RE | Admit: 2011-06-24 | Discharge: 2011-06-24 | Disposition: A | Discharge status: Home / Self Care | Payer: Medicaid Other | Source: Ambulatory Visit | Attending: Internal Medicine | Admitting: Internal Medicine

## 2011-06-24 DIAGNOSIS — Z1231 Encounter for screening mammogram for malignant neoplasm of breast: Secondary | ICD-10-CM | POA: Insufficient documentation

## 2011-11-05 ENCOUNTER — Other Ambulatory Visit: Payer: Self-pay | Admitting: Internal Medicine

## 2011-11-17 ENCOUNTER — Encounter: Payer: Self-pay | Admitting: Internal Medicine

## 2011-11-17 ENCOUNTER — Ambulatory Visit (INDEPENDENT_AMBULATORY_CARE_PROVIDER_SITE_OTHER): Payer: Medicaid Other | Admitting: Internal Medicine

## 2011-11-17 VITALS — BP 133/83 | HR 76 | Temp 97.4°F | Ht 63.5 in | Wt 104.3 lb

## 2011-11-17 DIAGNOSIS — Z139 Encounter for screening, unspecified: Secondary | ICD-10-CM

## 2011-11-17 DIAGNOSIS — K219 Gastro-esophageal reflux disease without esophagitis: Secondary | ICD-10-CM

## 2011-11-17 DIAGNOSIS — F411 Generalized anxiety disorder: Secondary | ICD-10-CM

## 2011-11-17 DIAGNOSIS — E785 Hyperlipidemia, unspecified: Secondary | ICD-10-CM

## 2011-11-17 DIAGNOSIS — F329 Major depressive disorder, single episode, unspecified: Secondary | ICD-10-CM

## 2011-11-17 LAB — CBC WITH DIFFERENTIAL/PLATELET
Eosinophils Absolute: 0.1 10*3/uL (ref 0.0–0.7)
Eosinophils Relative: 2 % (ref 0–5)
Hemoglobin: 13.9 g/dL (ref 12.0–15.0)
Lymphs Abs: 3.3 10*3/uL (ref 0.7–4.0)
MCH: 31.7 pg (ref 26.0–34.0)
MCV: 90.2 fL (ref 78.0–100.0)
Monocytes Relative: 8 % (ref 3–12)
RBC: 4.38 MIL/uL (ref 3.87–5.11)

## 2011-11-17 MED ORDER — OMEPRAZOLE 40 MG PO CPDR: 40.0000 mg | DELAYED_RELEASE_CAPSULE | Freq: Every day | ORAL | Status: DC

## 2011-11-17 MED ORDER — ATORVASTATIN CALCIUM 40 MG PO TABS: 40.0000 mg | ORAL_TABLET | Freq: Every day | ORAL | Status: DC

## 2011-11-17 MED ORDER — FLUOXETINE HCL 20 MG PO CAPS: 20.0000 mg | ORAL_CAPSULE | Freq: Every day | ORAL | Status: DC

## 2011-11-17 MED ORDER — ALPRAZOLAM 1 MG PO TABS: 1.0000 mg | ORAL_TABLET | Freq: Three times a day (TID) | ORAL | Status: DC | PRN

## 2011-11-17 NOTE — Progress Notes (Signed)
HPI: Ms. Mcelmurry is a 66 yo woman with PMH of GERD, HLP, anxiety presents today for routine follow up.  She is doing well except for being irritated because of her neighbor who keep been keeping her awake at night.  She is taking Xanax and Prozac which controls her symptoms, she has been on Xanax for more than 9 years after the death of her son.  Her GERD is not well-controlled with abdominal pain and heartburn because she has been off her PPI.  No other complaints today.  She has not been taking her statin because medicaid would not pay for Crestor.  She had Pravachol but has been taken it intermittently.    ROS: as per HPI  PE: General: alert, thin appearing, and cooperative to examination.  Lungs: normal respiratory effort, no accessory muscle use, normal breath sounds, no crackles, and no wheezes. Heart: normal rate, regular rhythm, no murmur, no gallop, and no rub.  Abdomen: soft, non-tender, normal bowel sounds, no distention, no guarding, no rebound tenderness, no hepatomegaly, and no splenomegaly.  Msk: no joint swelling, no joint warmth, and no redness over joints.  Pulses: 2+ DP/PT pulses bilaterally Extremities: No cyanosis, clubbing, edema Neurologic: alert & oriented X3, cranial nerves II-XII intact, strength normal in all extremities, sensation intact to light touch, and gait normal.  Skin: turgor normal and no rashes.  Psych: Oriented X3, memory intact for recent and remote, normally interactive, good eye contact, not anxious appearing, and not depressed appearing.

## 2011-11-17 NOTE — Assessment & Plan Note (Signed)
LDL 208 in 10/2010, but has not been taking statin consistently. -Will try Lipitor 40mg  qhs -Repeat Lipid panel today

## 2011-11-17 NOTE — Patient Instructions (Addendum)
Continue your current medications Will refer to GYN for pap smear and they will call you with an appointment Follow up with Dr. Anselm Jungling in 3 months

## 2011-11-17 NOTE — Assessment & Plan Note (Signed)
Stable, will continue Prozac 20mg  and Xanax 1mg  TID PRN. She has been on Xanax for 9 years since her son passed away.  She states that these are the only 2 meds that really help her.  She does not want to get off of Xanax. -Will continue current regimen

## 2011-11-17 NOTE — Assessment & Plan Note (Signed)
Not well controlled, has not been taking her PPI. -Will resume Omeprazole 40mg  qd

## 2011-11-18 ENCOUNTER — Other Ambulatory Visit: Payer: Self-pay | Admitting: Internal Medicine

## 2011-11-18 LAB — COMPLETE METABOLIC PANEL WITH GFR
ALT: 8 U/L (ref 0–35)
AST: 20 U/L (ref 0–37)
Calcium: 9.9 mg/dL (ref 8.4–10.5)
Chloride: 101 mEq/L (ref 96–112)
Creat: 0.74 mg/dL (ref 0.50–1.10)
Sodium: 137 mEq/L (ref 135–145)
Total Bilirubin: 0.5 mg/dL (ref 0.3–1.2)
Total Protein: 7.2 g/dL (ref 6.0–8.3)

## 2011-11-18 LAB — LIPID PANEL: Triglycerides: 500 mg/dL — ABNORMAL HIGH (ref <150)

## 2011-11-18 NOTE — Addendum Note (Signed)
Addended by: Bufford Spikes on: 11/18/2011 03:09 PM   Modules accepted: Orders

## 2011-11-24 ENCOUNTER — Encounter: Payer: Medicaid Other | Admitting: Internal Medicine

## 2011-11-24 ENCOUNTER — Other Ambulatory Visit: Payer: Self-pay | Admitting: Internal Medicine

## 2011-12-08 ENCOUNTER — Other Ambulatory Visit: Payer: Self-pay | Admitting: Internal Medicine

## 2011-12-08 NOTE — Telephone Encounter (Signed)
Pt is taking Lipitor per pharmacy.  They have removed pravastatin from her med list.

## 2011-12-08 NOTE — Telephone Encounter (Signed)
I ordered Lipitor 40mg  for her because her LDL is poorly controlled with Pravastatin. Can you check to see if medicaid paid for her lipitor?  If yes, she does not need Pravastatin. Thanks

## 2011-12-09 ENCOUNTER — Encounter: Payer: Self-pay | Admitting: Obstetrics & Gynecology

## 2011-12-09 ENCOUNTER — Ambulatory Visit (INDEPENDENT_AMBULATORY_CARE_PROVIDER_SITE_OTHER): Payer: Medicaid Other | Admitting: Obstetrics & Gynecology

## 2011-12-09 ENCOUNTER — Other Ambulatory Visit (HOSPITAL_COMMUNITY)
Admission: RE | Admit: 2011-12-09 | Discharge: 2011-12-09 | Disposition: A | Discharge status: Home / Self Care | Payer: Medicaid Other | Source: Ambulatory Visit | Attending: Obstetrics & Gynecology | Admitting: Obstetrics & Gynecology

## 2011-12-09 VITALS — BP 138/82 | HR 65 | Temp 97.0°F | Resp 20 | Ht 62.0 in | Wt 105.9 lb

## 2011-12-09 DIAGNOSIS — Z01419 Encounter for gynecological examination (general) (routine) without abnormal findings: Secondary | ICD-10-CM

## 2011-12-09 DIAGNOSIS — Z1151 Encounter for screening for human papillomavirus (HPV): Secondary | ICD-10-CM | POA: Insufficient documentation

## 2011-12-09 NOTE — Patient Instructions (Addendum)
Pelvic Pain Pelvic pain is pain below the belly button and located between your hips. Acute pain may last a few hours or days. Chronic pelvic pain may last weeks and months. The cause may be different for different types of pain. The pain may be dull or sharp, mild or severe and can interfere with your daily activities. Write down and tell your caregiver:   Exactly where the pain is located.   If it comes and goes or is there all the time.   When it happens (with sex, urination, bowel movement, etc.)   If the pain is related to your menstrual period or stress.  Your caregiver will take a full history and do a complete physical exam and Pap test. CAUSES   Painful menstrual periods (dysmenorrhea).   Normal ovulation (Mittelschmertz) that occurs in the middle of the menstrual cycle every month.   The pelvic organs get engorged with blood just before the menstrual period (pelvic congestive syndrome).   Scar tissue from an infection or past surgery (pelvic adhesions).   Cancer of the female pelvic organs. When there is pain with cancer, it has been there for a long time.   The lining of the uterus (endometrium) abnormally grows in places like the pelvis and on the pelvic organs (endometriosis).   A form of endometriosis with the lining of the uterus present inside of the muscle tissue of the uterus (adenomyosis).   Fibroid tumor (noncancerous) in the uterus.   Bladder problems such as infection, bladder spasms of the muscle tissue of the bladder.   Intestinal problems (irritable bowel syndrome, colitis, an ulcer or gastrointestinal infection).   Polyps of the cervix or uterus.   Pregnancy in the tube (ectopic pregnancy).   The opening of the cervix is too small for the menstrual blood to flow through it (cervical stenosis).   Physical or sexual abuse (past or present).   Musculo-skeletal problems from poor posture, problems with the vertebrae of the lower back or the uterine  pelvic muscles falling (prolapse).   Psychological problems such as depression or stress.   IUD (intrauterine device) in the uterus.  DIAGNOSIS  Tests to make a diagnosis depends on the type, location, severity and what causes the pain to occur. Tests that may be needed include:  Blood tests.   Urine tests   Ultrasound.   X-rays.   CT Scan.   MRI.   Laparoscopy.   Major surgery.  TREATMENT  Treatment will depend on the cause of the pain, which includes:  Prescription or over-the-counter pain medication.   Antibiotics.   Birth control pills.   Hormone treatment.   Nerve blocking injections.   Physical therapy.   Antidepressants.   Counseling with a psychiatrist or psychologist.   Minor or major surgery.  HOME CARE INSTRUCTIONS   Only take over-the-counter or prescription medicines for pain, discomfort or fever as directed by your caregiver.   Follow your caregiver's advice to treat your pain.   Rest.   Avoid sexual intercourse if it causes the pain.   Apply warm or cold compresses (which ever works best) to the pain area.   Do relaxation exercises such as yoga or meditation.   Try acupuncture.   Avoid stressful situations.   Try group therapy.   If the pain is because of a stomach/intestinal upset, drink clear liquids, eat a bland light food diet until the symptoms go away.  SEEK MEDICAL CARE IF:   You need stronger prescription pain medication.     You develop pain with sexual intercourse.   You have pain with urination.   You develop a temperature of 102 F (38.9 C) with the pain.   You are still in pain after 4 hours of taking prescription medication for the pain.   You need depression medication.   Your IUD is causing pain and you want it removed.  SEEK IMMEDIATE MEDICAL CARE IF:  You develop very severe pain or tenderness.   You faint, have chills, severe weakness or dehydration.   You develop heavy vaginal bleeding or passing  solid tissue.   You develop a temperature of 102 F (38.9 C) with the pain.   You have blood in the urine.   You are being physically or sexually abused.   You have uncontrolled vomiting and diarrhea.   You are depressed and afraid of harming yourself or someone else.  Document Released: 04/09/2004 Document Revised: 02/19/2011 Document Reviewed: 01/05/2008 [facility] Patient Information 2012 [place], [place].Pap Test A Pap test is a sampling of cells from a woman's cervix. The cervix is the opening between the vagina (birth canal) and the uterus (the bottom part of the womb). The cells are scraped from the cervix during a pelvic exam. These cells are then looked at under a microscope to see if the cells are normal or to see if a cancer is developing or there are changes that suggest a cancer will develop. Cervical dysplasia is a condition in which a woman has abnormal changes in the top layer of cells of her cervix. These changes are an early sign that cervical cancer may develop. Pap tests also look for the human papilloma virus (HPV) because it has 4 types that are responsible for 70% of cervical cancer. Infections can also be found during a Pap test such as bacteria, fungus, protozoa and viruses.  Cervical cancer is harder to treat and less likely to have a good outcome if left untreated. Catching the disease at an early stage leads to a better outcome. Since the Pap test was introduced 60 years ago, deaths from cervical cancer have decreased by 70%. Every woman should keep up to date with Pap tests. RISK FACTORS FOR CERVICAL CANCER INCLUDE:   Becoming sexually active before age 58.   Being the daughter of a woman who took diethylstilbestrol (DES) during pregnancy.   Having a sexual partner who has or has had cancer of the penis.   Having a sexual partner whose past partner had cervical cancer or cervical dysplasia (early cell changes which suggest a cancer may develop).   Having a weakened  immune system. An example would be HIV or other immunodeficiency disorder.   Having had a sexually transmitted infection such as chlamydia, gonorrhea or HPV.   Having had an abnormal Pap or cancer of the vagina or vulva.   Having had more than one sexual partner.   A history of cervical cancer in a woman's sister or mother.   Not using condoms with new sexual partners.   Smoking.  WHO SHOULD HAVE PAP TESTS  A Pap test is done to screen for cervical cancer.   The first Pap test should be done at age 54.   Between ages 26 and 37, Pap tests are repeated every 2 years.   Beginning at age 51, you are advised to have a Pap test every 3 years as long as your past 3 Pap tests have been normal.   Some women have medical problems that increase the chance of  getting cervical cancer. Talk to your caregiver about these problems. It is especially important to talk to your caregiver if a new problem develops soon after your last Pap test. In these cases, your caregiver may recommend more frequent screening and Pap tests.   The above recommendations are the same for women who have or have not gotten the vaccine for HPV (Human Papillomavirus).   If you had a hysterectomy for a problem that was not a cancer or a condition that could lead to cancer, then you no longer need Pap tests. However, even if you no longer need a Pap test, a regular exam is a good idea to make sure no other problems are starting.    If you are between ages 32 and 56, and you have had normal Pap tests going back 10 years, you no longer need Pap tests. However, even if you no longer need a Pap test, a regular exam is a good idea to make sure no other problems are starting.    If you have had past treatment for cervical cancer or a condition that could lead to cancer, you need Pap tests and screening for cancer for at least 20 years after your treatment.   If Pap tests have been discontinued, risk factors (such as a new  sexual partner) need to be re-assessed to determine if screening should be resumed.   Some women may need screenings more often if they are at high risk for cervical cancer.  PREPARATION FOR A PAP TEST A Pap test should be performed during the weeks before the start of menstruation. Women should not douche or have sexual intercourse for 24 hours before the test. No vaginal creams, diaphragms, or tampons should be used for 24 hours before the test. To minimize discomfort, a woman should empty her bladder just before the exam. TAKING THE PAP TEST The caregiver will perform a pelvic exam. A metal or plastic instrument (speculum) is placed in the vagina. This is done before your caregiver does a bimanual exam of your internal female organs. This instrument allows your caregiver to see the inside of the vagina and look at the cervix. A small, sterile brush is used to take a sample of cells from the internal opening of the cervix. A small wooden spatula is used to scrape the outside of the cervix. Neither of these two methods to collect cells will cause you pain. These two scrapings are placed on a glass slide or in a small bottle filled with a special liquid. The cells are looked at later under a microscope in a lab. A specialist will look at these cells and determine if the cells are normal. RESULTS OF YOUR PAP TEST  A healthy Pap test shows no abnormal cells or evidence of inflammation.   The presence of abnormally growing cells on the surface of the cervix may be reported as an abnormal Pap test. Different categories of findings are used to describe your Pap test. Your caregiver will go over the importance of these findings with you. The caregiver will then determine what follow-up is needed or when you should have your next pap test.   If you have had two or more abnormal Pap tests:   You may be asked to have a colposcopy. This is a test in which the cervix is viewed with a special lighted microscope.    A cervical tissue sample (biopsy) may also be needed. This involves taking a small tissue sample from the cervix.  The sample is looked at under a microscope to find the cause of the abnormal cells. Make sure you find out the results of the Pap test. If you have not received the results within two weeks, contact your caregiver's office for the results. Do not assume everything is normal if you have not heard from your caregiver or medical facility. It is important to follow up on all of your test results.  Document Released: 05/23/2002 Document Revised: 02/19/2011 Document Reviewed: 02/24/2011 [facility] Patient Information 2012 [place], [place].

## 2011-12-09 NOTE — Progress Notes (Signed)
Patient ID: Vrinda Nassiri, female   DOB: 13-Mar-1946, 66 y.o.   MRN: [id number] Subjective:    Riyaan Schwabe is a 66 y.o. female who presents for annual exam. The patient has no complaints today. The patient is not sexually active. GYN screening history: last pap: was normal. The patient is not taking hormone replacement therapy. Patient denies post-menopausal vaginal bleeding.. The patient wears seatbelts: yes. The patient participates in regular exercise: not asked. Has the patient ever been transfused or tattooed?: no. The patient reports that there is not domestic violence in her life.   Menstrual History: OB History    Grav Para Term Preterm Abortions TAB SAB Ect Mult Living                  Menarche age: n/a Patient's last menstrual period was 10/23/1982.    The following portions of the patient's history were reviewed and updated as appropriate: allergies, current medications, past family history, past medical history, past social history, past surgical history and problem list.  Review of Systems Pertinent items are noted in HPI.    Objective:    BP 138/82  Pulse 65  Temp 97 F (36.1 C) (Oral)  Resp 20  Ht 5\' 2"  (1.575 m)  Wt 105 lb 14.4 oz (48.036 kg)  BMI 19.37 kg/m2  LMP 10/23/1982  General Appearance:    Alert, cooperative, no distress, appears stated age  Head:    Normocephalic, without obvious abnormality, atraumatic  Eyes:    PERRL, conjunctiva/corneas clear, EOM's intact, fundi    benign, both eyes           Neck:   Supple, symmetrical, trachea midline, no adenopathy;    thyroid:  no enlargement/tenderness/nodules; no carotid   bruit or JVD  Back:     Symmetric, no curvature, ROM normal, no CVA tenderness  Lungs:     Clear to auscultation bilaterally, respirations unlabored  Chest Wall:    No tenderness or deformity   Heart:    Regular rate and rhythm, S1 and S2 normal, no murmur, rub   or gallop  Breast Exam:    No tenderness, masses, or nipple abnormality    Abdomen:     Soft, non-tender, bowel sounds active all four quadrants,    no masses, no organomegaly  Genitalia:    Normal female without lesion, discharge or tenderness GU: EGBUS: no lesions Vagina: no blood in vault Cervix: no lesion; no mucopurulent d/c Uterus: small, mobile Adnexa: no masses; non tender    Rectal:    Normal tone, normal prostate, no masses or tenderness;   guaiac negative stool  Extremities:   Extremities normal, atraumatic, no cyanosis or edema  Pulses:   2+ and symmetric all extremities  Skin:   Skin color, texture, turgor normal, no rashes or lesions  Lymph nodes:   Cervical, supraclavicular, and axillary nodes normal         Assessment:    Normal gyn exam  Pt with multiple medical problems and is frail- will get BMD study to eval risk for fractures   Plan:    Thin prep Pap smear.  Needs Bone densiometry  Kenneth Lax L. Harraway-Smith, M.D., [place]

## 2011-12-16 ENCOUNTER — Ambulatory Visit (HOSPITAL_COMMUNITY)
Admission: RE | Admit: 2011-12-16 | Discharge: 2011-12-16 | Disposition: A | Discharge status: Home / Self Care | Payer: Medicaid Other | Source: Ambulatory Visit | Attending: Obstetrics & Gynecology | Admitting: Obstetrics & Gynecology

## 2011-12-16 DIAGNOSIS — Z13820 Encounter for screening for osteoporosis: Secondary | ICD-10-CM | POA: Insufficient documentation

## 2011-12-16 DIAGNOSIS — M25559 Pain in unspecified hip: Secondary | ICD-10-CM | POA: Insufficient documentation

## 2011-12-16 DIAGNOSIS — Z01419 Encounter for gynecological examination (general) (routine) without abnormal findings: Secondary | ICD-10-CM

## 2011-12-24 ENCOUNTER — Encounter: Payer: Self-pay | Admitting: Obstetrics & Gynecology

## 2012-01-18 ENCOUNTER — Encounter: Payer: Self-pay | Admitting: Obstetrics & Gynecology

## 2012-01-18 ENCOUNTER — Ambulatory Visit (INDEPENDENT_AMBULATORY_CARE_PROVIDER_SITE_OTHER): Payer: Medicaid Other | Admitting: Obstetrics & Gynecology

## 2012-01-18 VITALS — BP 129/79 | HR 88 | Temp 96.7°F | Ht 62.0 in | Wt 109.7 lb

## 2012-01-18 DIAGNOSIS — Z716 Tobacco abuse counseling: Secondary | ICD-10-CM

## 2012-01-18 DIAGNOSIS — M8580 Other specified disorders of bone density and structure, unspecified site: Secondary | ICD-10-CM

## 2012-01-18 DIAGNOSIS — M899 Disorder of bone, unspecified: Secondary | ICD-10-CM

## 2012-01-18 DIAGNOSIS — Z7189 Other specified counseling: Secondary | ICD-10-CM

## 2012-01-18 DIAGNOSIS — F17200 Nicotine dependence, unspecified, uncomplicated: Secondary | ICD-10-CM

## 2012-01-18 MED ORDER — ALENDRONATE SODIUM 70 MG PO TABS: 70.0000 mg | ORAL_TABLET | ORAL | Status: DC

## 2012-01-18 NOTE — Progress Notes (Signed)
Subjective:     Patient ID: Shawna Hawkins, female   DOB: [birth year], 66 y.o.   MRN: [id number]  HPI Pt presents to review her results of BMD scan   Review of Systems     Objective:   Physical ExamBP 129/79  Pulse 88  Temp 96.7 F (35.9 C) (Oral)  Ht 5\' 2"  (1.575 m)  Wt 109 lb 11.2 oz (49.76 kg)  BMI 20.06 kg/m2  LMP 10/23/1982 See BMD scan results at hip -2.4       Assessment:     Osteopenia in pt HIGH risk for osteoporosis (  BMD -2.4 hip)  Plan:     rec tobacco cessation  Fosamax 70mg  po weekly Reviewed use of meds and need to sit or stand for at least prior to the procedure  F/u 2-3 months to evaluate response to meds Calcium 1200mg  daily  Vit D 800iu daily Pt requested written Rx Given handout about the risks of Fosamax and the side effects.    Margie Urbanowicz L. Harraway-Smith, M.D., [facility]

## 2012-01-18 NOTE — Patient Instructions (Addendum)
Osteoporosis  Throughout your life, your body breaks down old bone and replaces it with new bone. As you get older, your body does not replace bone as quickly as it breaks it down. By the age of 30 years, most people begin to gradually lose bone because of the imbalance between bone loss and replacement. Some people lose more bone than others. Bone loss beyond a specified normal degree is considered osteoporosis.    Osteoporosis affects the strength and durability of your bones. The inside of the ends of your bones and your flat bones, like the bones of your pelvis, look like honeycomb, filled with tiny open spaces. As bone loss occurs, your bones become less dense. This means that the open spaces inside your bones become bigger and the walls between these spaces become thinner. This makes your bones weaker. Bones of a person with osteoporosis can become so weak that they can break (fracture) during minor accidents, such as a simple fall.  CAUSES    The following factors have been associated with the development of osteoporosis:   Smoking.   Drinking more than 2 alcoholic drinks several days per week.   Long-term use of certain medicines:   Corticosteroids.   Chemotherapy medicines.   Thyroid medicines.   Antiepileptic medicines.   Gonadal hormone suppression medicine.   Immunosuppression medicine.   Being underweight.   Lack of physical activity.   Lack of exposure to the sun. This can lead to vitamin D deficiency.   Certain medical conditions:   Certain inflammatory bowel diseases, such as Crohn's disease and ulcerative colitis.   Diabetes.   Hyperthyroidism.   Hyperparathyroidism.  RISK FACTORS  Anyone can develop osteoporosis. However, the following factors can increase your risk of developing osteoporosis:   Gender Women are at higher risk than men.   Age Being older than 50 years increases your risk.   Ethnicity White and Asian people have an increased risk.    Weight Being extremely underweight can increase your risk of osteoporosis.   Family history of osteoporosis Having a family member who has developed osteoporosis can increase your risk.  SYMPTOMS    Usually, people with osteoporosis have no symptoms.    DIAGNOSIS    Signs during a physical exam that may prompt your caregiver to suspect osteoporosis include:   Decreased height. This is usually caused by the compression of the bones that form your spine (vertebrae) because they have weakened and become fractured.   A curving or rounding of the upper back (kyphosis).  To confirm signs of osteoporosis, your caregiver may request a procedure that uses 2 low-dose X-ray beams with different levels of energy to measure your bone mineral density (dual-energy X-ray absorptiometry [DXA]). Also, your caregiver may check your level of vitamin D.  TREATMENT    The goal of osteoporosis treatment is to strengthen bones in order to decrease the risk of bone fractures. There are different types of medicines available to help achieve this goal. Some of these medicines work by slowing the processes of bone loss. Some medicines work by increasing bone density. Treatment also involves making sure that your levels of calcium and vitamin D are adequate.  PREVENTION    There are things you can do to help prevent osteoporosis. Adequate intake of calcium and vitamin D can help you achieve optimal bone mineral density. Regular exercise can also help, especially resistance and high-impact activities. If you smoke, quitting smoking is an important part of osteoporosis prevention.  MAKE 

## 2012-02-23 ENCOUNTER — Other Ambulatory Visit: Payer: Self-pay | Admitting: *Deleted

## 2012-02-23 MED ORDER — FLUOXETINE HCL 20 MG PO CAPS: 20.0000 mg | ORAL_CAPSULE | Freq: Every day | ORAL | Status: DC

## 2012-02-24 NOTE — Telephone Encounter (Signed)
Rx called in to pharmacy. 

## 2012-03-17 ENCOUNTER — Encounter: Payer: Self-pay | Admitting: Internal Medicine

## 2012-03-17 ENCOUNTER — Ambulatory Visit (INDEPENDENT_AMBULATORY_CARE_PROVIDER_SITE_OTHER): Payer: Medicaid Other | Admitting: Internal Medicine

## 2012-03-17 VITALS — BP 122/66 | HR 79 | Temp 97.2°F | Ht 63.0 in | Wt 114.1 lb

## 2012-03-17 DIAGNOSIS — M25579 Pain in unspecified ankle and joints of unspecified foot: Secondary | ICD-10-CM

## 2012-03-17 DIAGNOSIS — E785 Hyperlipidemia, unspecified: Secondary | ICD-10-CM

## 2012-03-17 DIAGNOSIS — F329 Major depressive disorder, single episode, unspecified: Secondary | ICD-10-CM

## 2012-03-17 MED ORDER — FLUOXETINE HCL 20 MG PO CAPS: 20.0000 mg | ORAL_CAPSULE | Freq: Every day | ORAL | Status: DC
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
## 2012-05-31 ENCOUNTER — Other Ambulatory Visit: Payer: Self-pay | Admitting: *Deleted

## 2012-05-31 MED ORDER — FLUOXETINE HCL 20 MG PO CAPS: 20.0000 mg | ORAL_CAPSULE | Freq: Every day | ORAL | Status: DC

## 2012-06-01 NOTE — Telephone Encounter (Signed)
Rx called in to pharmacy. 

## 2012-06-09 ENCOUNTER — Other Ambulatory Visit: Payer: Self-pay | Admitting: *Deleted

## 2012-06-09 DIAGNOSIS — F329 Major depressive disorder, single episode, unspecified: Secondary | ICD-10-CM

## 2012-06-09 DIAGNOSIS — F411 Generalized anxiety disorder: Secondary | ICD-10-CM

## 2012-06-09 MED ORDER — ALPRAZOLAM 1 MG PO TABS: 1.0000 mg | ORAL_TABLET | Freq: Three times a day (TID) | ORAL | Status: DC | PRN

## 2012-06-09 NOTE — Telephone Encounter (Signed)
Please call in for patient

## 2012-06-10 NOTE — Telephone Encounter (Signed)
Alprazolam rx called to [facility]. 

## 2012-06-17 DIAGNOSIS — F332 Major depressive disorder, recurrent severe without psychotic features: Secondary | ICD-10-CM | POA: Diagnosis not present

## 2012-06-21 ENCOUNTER — Encounter: Payer: Self-pay | Admitting: Internal Medicine

## 2012-06-21 DIAGNOSIS — R269 Unspecified abnormalities of gait and mobility: Secondary | ICD-10-CM | POA: Insufficient documentation

## 2012-06-24 DIAGNOSIS — F332 Major depressive disorder, recurrent severe without psychotic features: Secondary | ICD-10-CM | POA: Diagnosis not present

## 2012-07-08 DIAGNOSIS — F332 Major depressive disorder, recurrent severe without psychotic features: Secondary | ICD-10-CM | POA: Diagnosis not present

## 2012-07-22 DIAGNOSIS — F332 Major depressive disorder, recurrent severe without psychotic features: Secondary | ICD-10-CM | POA: Diagnosis not present

## 2012-07-29 DIAGNOSIS — F332 Major depressive disorder, recurrent severe without psychotic features: Secondary | ICD-10-CM | POA: Diagnosis not present

## 2012-08-11 ENCOUNTER — Other Ambulatory Visit: Payer: Self-pay | Admitting: Internal Medicine

## 2012-08-11 DIAGNOSIS — Z1231 Encounter for screening mammogram for malignant neoplasm of breast: Secondary | ICD-10-CM

## 2012-08-25 ENCOUNTER — Ambulatory Visit (HOSPITAL_COMMUNITY)
Admission: RE | Admit: 2012-08-25 | Discharge: 2012-08-25 | Disposition: A | Discharge status: Home / Self Care | Payer: Medicare Other | Source: Ambulatory Visit | Attending: Internal Medicine | Admitting: Internal Medicine

## 2012-08-25 DIAGNOSIS — Z1231 Encounter for screening mammogram for malignant neoplasm of breast: Secondary | ICD-10-CM | POA: Diagnosis not present

## 2012-08-26 DIAGNOSIS — F332 Major depressive disorder, recurrent severe without psychotic features: Secondary | ICD-10-CM | POA: Diagnosis not present

## 2012-08-29 ENCOUNTER — Other Ambulatory Visit: Payer: Self-pay | Admitting: Internal Medicine

## 2012-08-29 DIAGNOSIS — R928 Other abnormal and inconclusive findings on diagnostic imaging of breast: Secondary | ICD-10-CM

## 2012-09-08 ENCOUNTER — Other Ambulatory Visit: Payer: Self-pay | Admitting: *Deleted

## 2012-09-08 MED ORDER — FLUOXETINE HCL 20 MG PO CAPS: 20.0000 mg | ORAL_CAPSULE | Freq: Every day | ORAL | Status: DC

## 2012-09-09 DIAGNOSIS — F332 Major depressive disorder, recurrent severe without psychotic features: Secondary | ICD-10-CM | POA: Diagnosis not present

## 2012-09-13 ENCOUNTER — Other Ambulatory Visit: Payer: Self-pay | Admitting: Internal Medicine

## 2012-09-13 ENCOUNTER — Ambulatory Visit
Admission: RE | Admit: 2012-09-13 | Discharge: 2012-09-13 | Disposition: A | Discharge status: Home / Self Care | Payer: Medicare Other | Source: Ambulatory Visit | Attending: Internal Medicine | Admitting: Internal Medicine

## 2012-09-13 DIAGNOSIS — R928 Other abnormal and inconclusive findings on diagnostic imaging of breast: Secondary | ICD-10-CM

## 2012-09-13 DIAGNOSIS — N6310 Unspecified lump in the right breast, unspecified quadrant: Secondary | ICD-10-CM

## 2012-09-13 DIAGNOSIS — N63 Unspecified lump in unspecified breast: Secondary | ICD-10-CM | POA: Diagnosis not present

## 2012-09-22 ENCOUNTER — Ambulatory Visit
Admission: RE | Admit: 2012-09-22 | Discharge: 2012-09-22 | Disposition: A | Discharge status: Home / Self Care | Payer: Medicare Other | Source: Ambulatory Visit | Attending: Internal Medicine | Admitting: Internal Medicine

## 2012-09-22 DIAGNOSIS — C50919 Malignant neoplasm of unspecified site of unspecified female breast: Secondary | ICD-10-CM | POA: Diagnosis not present

## 2012-09-22 DIAGNOSIS — N6310 Unspecified lump in the right breast, unspecified quadrant: Secondary | ICD-10-CM

## 2012-09-22 DIAGNOSIS — N63 Unspecified lump in unspecified breast: Secondary | ICD-10-CM | POA: Diagnosis not present

## 2012-09-23 ENCOUNTER — Other Ambulatory Visit: Payer: Self-pay | Admitting: Internal Medicine

## 2012-09-23 DIAGNOSIS — C50911 Malignant neoplasm of unspecified site of right female breast: Secondary | ICD-10-CM

## 2012-09-23 DIAGNOSIS — F332 Major depressive disorder, recurrent severe without psychotic features: Secondary | ICD-10-CM | POA: Diagnosis not present

## 2012-09-26 ENCOUNTER — Telehealth: Payer: Self-pay | Admitting: *Deleted

## 2012-09-26 ENCOUNTER — Other Ambulatory Visit: Payer: Self-pay | Admitting: *Deleted

## 2012-09-26 DIAGNOSIS — C50311 Malignant neoplasm of lower-inner quadrant of right female breast: Secondary | ICD-10-CM

## 2012-09-26 DIAGNOSIS — C50319 Malignant neoplasm of lower-inner quadrant of unspecified female breast: Secondary | ICD-10-CM | POA: Insufficient documentation

## 2012-09-26 NOTE — Telephone Encounter (Signed)
Confirmed [facility] for 09/28/12 at 0830.  Instructions and contact information given.

## 2012-09-28 ENCOUNTER — Encounter: Payer: Self-pay | Admitting: *Deleted

## 2012-09-28 ENCOUNTER — Ambulatory Visit
Admission: RE | Admit: 2012-09-28 | Discharge: 2012-09-28 | Disposition: A | Discharge status: Home / Self Care | Payer: Medicare Other | Source: Ambulatory Visit | Attending: Radiation Oncology | Admitting: Radiation Oncology

## 2012-09-28 ENCOUNTER — Encounter: Payer: Self-pay | Admitting: Oncology

## 2012-09-28 ENCOUNTER — Ambulatory Visit: Payer: Medicare Other | Attending: General Surgery | Admitting: Physical Therapy

## 2012-09-28 ENCOUNTER — Other Ambulatory Visit (HOSPITAL_BASED_OUTPATIENT_CLINIC_OR_DEPARTMENT_OTHER): Payer: Medicare Other | Admitting: Lab

## 2012-09-28 ENCOUNTER — Ambulatory Visit: Payer: Medicare Other

## 2012-09-28 ENCOUNTER — Ambulatory Visit (HOSPITAL_BASED_OUTPATIENT_CLINIC_OR_DEPARTMENT_OTHER): Payer: Medicare Other | Admitting: General Surgery

## 2012-09-28 ENCOUNTER — Encounter (INDEPENDENT_AMBULATORY_CARE_PROVIDER_SITE_OTHER): Payer: Self-pay | Admitting: General Surgery

## 2012-09-28 ENCOUNTER — Ambulatory Visit (HOSPITAL_BASED_OUTPATIENT_CLINIC_OR_DEPARTMENT_OTHER): Payer: Medicare Other | Admitting: Oncology

## 2012-09-28 VITALS — BP 95/63 | HR 73 | Temp 97.4°F | Resp 20 | Ht 63.0 in | Wt 104.9 lb

## 2012-09-28 DIAGNOSIS — C50311 Malignant neoplasm of lower-inner quadrant of right female breast: Secondary | ICD-10-CM

## 2012-09-28 DIAGNOSIS — IMO0001 Reserved for inherently not codable concepts without codable children: Secondary | ICD-10-CM | POA: Insufficient documentation

## 2012-09-28 DIAGNOSIS — C50319 Malignant neoplasm of lower-inner quadrant of unspecified female breast: Secondary | ICD-10-CM

## 2012-09-28 DIAGNOSIS — C50919 Malignant neoplasm of unspecified site of unspecified female breast: Secondary | ICD-10-CM | POA: Diagnosis not present

## 2012-09-28 DIAGNOSIS — R293 Abnormal posture: Secondary | ICD-10-CM | POA: Diagnosis not present

## 2012-09-28 DIAGNOSIS — Z01818 Encounter for other preprocedural examination: Secondary | ICD-10-CM | POA: Diagnosis not present

## 2012-09-28 LAB — COMPREHENSIVE METABOLIC PANEL (CC13)
AST: 17 U/L (ref 5–34)
Albumin: 3.6 g/dL (ref 3.5–5.0)
Alkaline Phosphatase: 83 U/L (ref 40–150)
BUN: 13.3 mg/dL (ref 7.0–26.0)
Creatinine: 1 mg/dL (ref 0.6–1.1)
Glucose: 81 mg/dl (ref 70–140)
Total Bilirubin: 0.42 mg/dL (ref 0.20–1.20)

## 2012-09-28 LAB — CBC WITH DIFFERENTIAL/PLATELET
Basophils Absolute: 0.1 10*3/uL (ref 0.0–0.1)
EOS%: 2.2 % (ref 0.0–7.0)
Eosinophils Absolute: 0.2 10*3/uL (ref 0.0–0.5)
HGB: 13.5 g/dL (ref 11.6–15.9)
LYMPH%: 43.4 % (ref 14.0–49.7)
MCH: 32.1 pg (ref 25.1–34.0)
MCV: 92.6 fL (ref 79.5–101.0)
MONO%: 8.7 % (ref 0.0–14.0)
NEUT#: 4.1 10*3/uL (ref 1.5–6.5)
NEUT%: 44.8 % (ref 38.4–76.8)
Platelets: 270 10*3/uL (ref 145–400)

## 2012-09-28 NOTE — Progress Notes (Signed)
ID: Shawna Hawkins OB: [birth year]  MR#: [id number]  WUJ#:[id number]  PCP: Dede Query, MD GYN:   SU:  OTHER MD:   HISTORY OF PRESENT ILLNESS: "Shawna Hawkins" had screening mammography at [facility] 08/25/2012 showing a possible mass in the right breast. Right diagnostic mammography at the breast Center 09/13/2012 confirmed a 7 mm spiculated mass in the right lower inner quadrant posteriorly. This mass was not palpable by exam. By ultrasound it measured 5 mm.  On 09/22/2012 the patient underwent biopsy of the mass in question, the pathology showing ([id number]) an invasive ductal carcinoma, grade 1, estrogen and progesterone receptor positive, HER-2 not amplified, with an MIB-1 of 10%.  The patient's subsequent history is as detailed below  INTERVAL HISTORY: Shawna Hawkins was evaluated in the multidisciplinary breast cancer clinic 09/28/2012 accompanied by a friend.  REVIEW OF SYSTEMS: Were no unusual symptoms leading to her mammography, which was routine. She tolerated the biopsy without any unusual complications. She does have multiple chronic symptoms including insomnia, chills at night, chronic fatigue, frequent muscle cramps. Ringing in your ears, sinus problems, poorly fitting dentures, hoarseness, mouth sores, poor circulation, shortness of breath, sleeping on 2 pillows, dry cough, poor appetite, nausea and vomiting, hiatal hernia, possible psoriasis, chronic back and joint pain, forgetfulness, arthritis, and depression. A detailed review of systems was otherwise noncontributory  PAST MEDICAL HISTORY: Past Medical History  Diagnosis Date  . Depression   . Anxiety   . Hyperlipidemia     PAST SURGICAL HISTORY: Past Surgical History  Procedure Laterality Date  . Polypectomy  2008  . Cholecystectomy    . Multiple tooth extractions    . Tonsillectomy      FAMILY HISTORY Family History  Problem Relation Age of Onset  . Depression Mother   . Cancer Mother     lung   the patient's father died  at the age of 73, the patient's mother at the age of 23. The patient has one brother, 6 sisters. The patient's mother had lung cancer diagnosed in her 76s. There is no history of breast or ringing cancer in the family to the patient's knowledge  GYNECOLOGIC HISTORY:  Menarche age 70, first live birth age 80, the patient is GX P3. She went through menopause in the late 1990s. She did not take hormone replacement.  SOCIAL HISTORY:  The patient has had various jobs in the past but is currently employed. Son Chanetta Marshall lives in [place] where he works as a Sports administrator. Son Thayer Ohm is currently in detail and she does not know exactly where. Son Nedra Hai is deceased    ADVANCED DIRECTIVES: Not in place   HEALTH MAINTENANCE: History  Substance Use Topics  . Smoking status: Current Every Day Smoker -- 1.00 packs/day for 30 years    Types: Cigarettes  . Smokeless tobacco: Not on file     Comment: "trying to quit"  . Alcohol Use: No     Colonoscopy:  PAP:  Bone density: Obtained at [facility] October 2013, showing a T score of -2.4 at the left femoral neck  Lipid panel:  Allergies  Allergen Reactions  . Codeine Sulfate Nausea Only  . Hydroxyzine Hcl   . Oxycodone-Acetaminophen Nausea Only  . Oxycodone-Aspirin Nausea Only  . Penicillins     REACTION: itching    Current Outpatient Prescriptions  Medication Sig Dispense Refill  . alendronate (FOSAMAX) 70 MG tablet Take 1 tablet (70 mg total) by mouth every 7 (seven) days. Take with a full glass of water on  an empty stomach.  4 tablet  12  . ALPRAZolam (XANAX) 1 MG tablet Take 1 tablet (1 mg total) by mouth 3 (three) times daily as needed for sleep or anxiety.  90 tablet  5  . FLUoxetine (PROZAC) 20 MG capsule Take 1 capsule (20 mg total) by mouth daily.  90 capsule  0  . omeprazole (PRILOSEC) 40 MG capsule Take 1 capsule (40 mg total) by mouth daily.  90 capsule  11  . atorvastatin (LIPITOR) 40 MG tablet Take 1 tablet (40 mg total) by mouth  daily.  90 tablet  11   No current facility-administered medications for this visit.    OBJECTIVE: Middle-aged white woman who appears frail Filed Vitals:   09/28/12 0915  BP: 95/63  Pulse: 73  Temp: 97.4 F (36.3 C)  Resp: 20     Body mass index is 18.59 kg/(m^2).    ECOG FS: 1  Sclerae unicteric Oropharynx clear No cervical or supraclavicular adenopathy Lungs no rales or rhonchi Heart regular rate and rhythm Abd benign MSK mild kyphosis but no focal spinal tenderness, no peripheral edema Neuro: non-focal, well-oriented, anxious affect Breasts: The right breast is status post recent biopsy. I do not palpate a well-defined mass. I do not see any suspicious skin or nipple change. The right axilla is benign. The left breast is unremarkable.   LAB RESULTS:  CMP     Component Value Date/Time   NA 140 09/28/2012 0856   NA 137 11/17/2011 1618   NA 138 02/16/2008   K 3.9 09/28/2012 0856   K 4.2 11/17/2011 1618   CL 101 11/17/2011 1618   CO2 27 09/28/2012 0856   CO2 29 11/17/2011 1618   GLUCOSE 81 09/28/2012 0856   GLUCOSE 106* 11/17/2011 1618   BUN 13.3 09/28/2012 0856   BUN 5* 11/17/2011 1618   BUN 7 02/16/2008   CREATININE 1.0 09/28/2012 0856   CREATININE 0.74 11/17/2011 1618   CREATININE 0.88 02/16/2008 0000   CREATININE 0.9 02/16/2008   CALCIUM 9.6 09/28/2012 0856   CALCIUM 9.9 11/17/2011 1618   PROT 7.6 09/28/2012 0856   PROT 7.2 11/17/2011 1618   ALBUMIN 3.6 09/28/2012 0856   ALBUMIN 4.4 11/17/2011 1618   AST 17 09/28/2012 0856   AST 20 11/17/2011 1618   ALT 7 09/28/2012 0856   ALT 8 11/17/2011 1618   ALKPHOS 83 09/28/2012 0856   ALKPHOS 80 11/17/2011 1618   BILITOT 0.42 09/28/2012 0856   BILITOT 0.5 11/17/2011 1618    I No results found for this basename: SPEP, UPEP,  kappa and lambda light chains    Lab Results  Component Value Date   WBC 9.2 09/28/2012   NEUTROABS 4.1 09/28/2012   HGB 13.5 09/28/2012   HCT 38.9 09/28/2012   MCV 92.6 09/28/2012   PLT 270 09/28/2012      Chemistry       Component Value Date/Time   NA 140 09/28/2012 0856   NA 137 11/17/2011 1618   NA 138 02/16/2008   K 3.9 09/28/2012 0856   K 4.2 11/17/2011 1618   CL 101 11/17/2011 1618   CO2 27 09/28/2012 0856   CO2 29 11/17/2011 1618   BUN 13.3 09/28/2012 0856   BUN 5* 11/17/2011 1618   BUN 7 02/16/2008   CREATININE 1.0 09/28/2012 0856   CREATININE 0.74 11/17/2011 1618   CREATININE 0.88 02/16/2008 0000   CREATININE 0.9 02/16/2008   GLU 66 02/16/2008      Component Value Date/Time  CALCIUM 9.6 09/28/2012 0856   CALCIUM 9.9 11/17/2011 1618   ALKPHOS 83 09/28/2012 0856   ALKPHOS 80 11/17/2011 1618   AST 17 09/28/2012 0856   AST 20 11/17/2011 1618   ALT 7 09/28/2012 0856   ALT 8 11/17/2011 1618   BILITOT 0.42 09/28/2012 0856   BILITOT 0.5 11/17/2011 1618       No results found for this basename: LABCA2    No components found with this basename: LABCA125    No results found for this basename: INR,  in the last 168 hours  Urinalysis No results found for this basename: colorurine, appearanceur, labspec, phurine, glucoseu, hgbur, bilirubinur, ketonesur, proteinur, urobilinogen, nitrite, leukocytesur    STUDIES: US Breast Right  09/13/2012   *RADIOLOGY REPORT*  Clinical Data:  The patient returns for evaluation of a possible mass in the right breast noted on recent screening study dated 08/25/2012.  DIGITAL DIAGNOSTIC RIGHT LIMITED MAMMOGRAM  AND RIGHT BREAST ULTRASOUND:  Comparison:  06/24/2011, 05/30/2008  Findings:  ACR Breast Density Category c:  The breasts are heterogeneously dense, which may obscure small masses.  Additional views confirm the presence of a 7 x 7 x 6 mm spiculated mass in the right lower inner quadrant posteriorly.  Mammographic images were processed with CAD.  On physical exam, no mass is palpated in the right lower inner quadrant.  Ultrasound is performed, showing an irregular mass at 3:30 o'clock 6 cm from the right nipple measuring 5 x 4 x 5 mm.  Sonography of the right axilla demonstrates no abnormal  lymph nodes.  IMPRESSION: Spiculated mass at 3:30 o'clock 6 cm from the right nipple. Appearance is suspicious for invasive mammary carcinoma.  RECOMMENDATION: Ultrasound- guided core needle biopsy is recommended.  This will be scheduled at a convenient time for the patient.  She could not stay today due to transportation issues.  I have discussed the findings and recommendations with the patient. Results were also provided in writing at the conclusion of the visit.  If applicable, a reminder letter will be sent to the patient regarding the next appointment.  BI-RADS CATEGORY 5:  Highly suggestive of malignancy - appropriate action should be taken.   Original Report Authenticated By: Cain Saupe, M.D.   [facility] R  09/13/2012   *RADIOLOGY REPORT*  Clinical Data:  The patient returns for evaluation of a possible mass in the right breast noted on recent screening study dated 08/25/2012.  DIGITAL DIAGNOSTIC RIGHT LIMITED MAMMOGRAM  AND RIGHT BREAST ULTRASOUND:  Comparison:  06/24/2011, 05/30/2008  Findings:  ACR Breast Density Category c:  The breasts are heterogeneously dense, which may obscure small masses.  Additional views confirm the presence of a 7 x 7 x 6 mm spiculated mass in the right lower inner quadrant posteriorly.  Mammographic images were processed with CAD.  On physical exam, no mass is palpated in the right lower inner quadrant.  Ultrasound is performed, showing an irregular mass at 3:30 o'clock 6 cm from the right nipple measuring 5 x 4 x 5 mm.  Sonography of the right axilla demonstrates no abnormal lymph nodes.  IMPRESSION: Spiculated mass at 3:30 o'clock 6 cm from the right nipple. Appearance is suspicious for invasive mammary carcinoma.  RECOMMENDATION: Ultrasound- guided core needle biopsy is recommended.  This will be scheduled at a convenient time for the patient.  She could not stay today due to transportation issues.  I have discussed the findings and recommendations with the  patient. Results were also  provided in writing at the conclusion of the visit.  If applicable, a reminder letter will be sent to the patient regarding the next appointment.  BI-RADS CATEGORY 5:  Highly suggestive of malignancy - appropriate action should be taken.   Original Report Authenticated By: Cain Saupe, M.D.   Mm [facility] [employer] R  09/22/2012   *RADIOLOGY REPORT*  Clinical Data: The patient is post ultrasound guided core biopsy of a 5 mm irregular mass over the 03:30 position of the right breast 6 cm from the nipple.  ULTRASOUND GUIDED POST-BIOPSY CLIP PLACEMENT RIGHT  Comparison:  Previous exams.  Findings: Examination demonstrates satisfactory placement of a cylindrical metallic clip over the biopsied mass in the inner mid to lower right breast.  Remainder of the exam is unchanged.  IMPRESSION: Satisfactory clip placement post ultrasound guided core right breast biopsy.   Original Report Authenticated By: Elberta Fortis, M.D.   [place] Rt Breast Bx W Loc Dev 1st Lesion Img Bx Spec US Guide  09/23/2012   **ADDENDUM** CREATED: 09/23/2012 11:14:01  Grade 1 invasive ductal carcinoma was reported histologically. This corresponds well with the imaging findings.  The patient was contacted by telephone and given the results of the biopsy.  She states the biopsy site is clean and dry with no signs of hematoma or infection.  The patient will be seen at the Multidisciplinary Clinic on 09/28/2012.  Breast MRI will be arranged at the patient's convenience.  **END ADDENDUM** SIGNED BY: Dina L. Judyann Munson, M.D.  09/22/2012   *RADIOLOGY REPORT*  Clinical Data:  Patient presents for ultrasound-guided core biopsy of a 5 mm irregular mass at the 03:30 position of the right breast.  ULTRASOUND GUIDED CORE BIOPSY OF THE right BREAST  Comparison: Previous exams.  I met with the patient and we discussed the procedure of ultrasound- guided biopsy, including benefits and alternatives.  We discussed the high likelihood of  a successful procedure. We discussed the risks of the procedure, including infection, bleeding, tissue injury, clip migration, and inadequate sampling.  Informed written consent was given. The usual time-out protocol was performed immediately prior to the procedure.  Using sterile technique and 2% Lidocaine as local anesthetic, under direct ultrasound visualization, a 14 gauge spring-loaded device was used to perform biopsy of 5 mm irregular mass at the 03:30 position using a inferior to superior approach.  At the conclusion of the procedure a cylindrical shaped tissue marker clip was deployed into the biopsy cavity.  Follow up 2 view mammogram was performed and dictated separately.  IMPRESSION: Ultrasound guided biopsy of a suspicious 5 mm right breast mass. No apparent complications.  Original Report Authenticated By: Elberta Fortis, M.D.    ASSESSMENT: 67 y.o. [place] woman status post lower inner quadrant right breast biopsy 09/22/2012 for a clinical T1a/b N0, stage IA invasive ductal carcinoma, grade 1, estrogen and progesterone receptor positive, HER-2 not amplified, with an MIB-1 of 10%.  PLAN: We spent the better part of this morning's visit discussing the biology of breast cancer in general and the specifics of Shawna Hawkins's tumor in particular. She understands that for cancers are 5 mm or less the standard of care is no chemotherapy and after radiation we would consider antiestrogen.  If the tumor proves to be larger than that we will send an Oncotype DX and we discussed that in detail today. She was given this information in writing as well.  My expectation is that she likely will not need chemotherapy as the benefit in terms of risk  reduction seems likely to be marginal in her case. However we left a decision open. She does understand that she will benefit greatly from antiestrogen therapy both to reduce the risk of local recurrence, systemic recurrence, and the development of a new breast cancer in  either breast in the future.  I am going to see Shawna Hawkins 2-3 weeks after her definitive surgery so we will have the Oncotype results in hand. At that time we will make a definitive plan regarding her optimal systemic treatment.       Lowella Dell, MD   09/28/2012 12:11 PM

## 2012-09-28 NOTE — Progress Notes (Signed)
Patient ID: Shawna Hawkins, female   DOB: Jul 31, 1945, 67 y.o.   MRN: [id number]  Chief Complaint  Patient presents with  . Other    HPI Shawna Hawkins is a 67 y.o. female.  Referred by Dr. Lindi Adie HPI 7 yof who has significant anxiety who underwent screening mm with finding of a lower inner right breast mass. She had no complaints referable to either breast.  The mm shows a 7x7x6 mm mass and u/s shows a 5x4x5 mm mass.  She has mr scheduled for Friday which we discussed today and decided to omit.  She underwent a biopsy that shows a grade I invasive ductal carcinoma that is er/pr positive, her2 not amplified and Ki67 is 10%  She comes in today to discuss her options. Past Medical History  Diagnosis Date  . Depression   . Anxiety   . Hyperlipidemia     Past Surgical History  Procedure Laterality Date  . Polypectomy  2008  . Cholecystectomy    . Multiple tooth extractions    . Tonsillectomy      Family History  Problem Relation Age of Onset  . Depression Mother   . Cancer Mother     lung    Social History History  Substance Use Topics  . Smoking status: Current Every Day Smoker -- 1.00 packs/day for 30 years    Types: Cigarettes  . Smokeless tobacco: Not on file     Comment: "trying to quit"  . Alcohol Use: No    Allergies  Allergen Reactions  . Codeine Sulfate Nausea Only  . Hydroxyzine Hcl   . Oxycodone-Acetaminophen Nausea Only  . Oxycodone-Aspirin Nausea Only  . Penicillins     REACTION: itching    Current Outpatient Prescriptions  Medication Sig Dispense Refill  . alendronate (FOSAMAX) 70 MG tablet Take 1 tablet (70 mg total) by mouth every 7 (seven) days. Take with a full glass of water on an empty stomach.  4 tablet  12  . ALPRAZolam (XANAX) 1 MG tablet Take 1 tablet (1 mg total) by mouth 3 (three) times daily as needed for sleep or anxiety.  90 tablet  5  . atorvastatin (LIPITOR) 40 MG tablet Take 1 tablet (40 mg total) by mouth daily.  90 tablet  11  .  FLUoxetine (PROZAC) 20 MG capsule Take 1 capsule (20 mg total) by mouth daily.  90 capsule  0  . omeprazole (PRILOSEC) 40 MG capsule Take 1 capsule (40 mg total) by mouth daily.  90 capsule  11   No current facility-administered medications for this visit.    Review of Systems Review of Systems  Constitutional: Positive for chills. Negative for fever and unexpected weight change.  HENT: Positive for tinnitus. Negative for hearing loss, congestion, sore throat, trouble swallowing and voice change.   Eyes: Negative for visual disturbance.  Respiratory: Positive for shortness of breath. Negative for cough and wheezing.   Cardiovascular: Negative for chest pain, palpitations and leg swelling.  Gastrointestinal: Negative for nausea, vomiting, abdominal pain, diarrhea, constipation, blood in stool, abdominal distention and anal bleeding.  Genitourinary: Negative for hematuria, vaginal bleeding and difficulty urinating.  Musculoskeletal: Positive for back pain. Negative for arthralgias.  Skin: Negative for rash and wound.  Neurological: Negative for seizures, syncope and headaches.  Hematological: Negative for adenopathy. Does not bruise/bleed easily.  Psychiatric/Behavioral: Negative for confusion.    Last menstrual period 10/23/1982.  Physical Exam Physical Exam  Vitals reviewed. Constitutional: She appears well-developed and well-nourished.  Neck:  Neck supple.  Cardiovascular: Normal rate, regular rhythm and normal heart sounds.   Pulmonary/Chest: Effort normal and breath sounds normal. She has no wheezes. She has no rales. Right breast exhibits no inverted nipple, no mass, no nipple discharge, no skin change and no tenderness. Left breast exhibits no inverted nipple, no mass, no nipple discharge, no skin change and no tenderness. Breasts are symmetrical.  Lymphadenopathy:    She has no cervical adenopathy.    She has no axillary adenopathy.       Right: No supraclavicular adenopathy  present.       Left: No supraclavicular adenopathy present.    Data Reviewed DIGITAL DIAGNOSTIC RIGHT LIMITED MAMMOGRAM AND RIGHT BREAST  ULTRASOUND:  Comparison: 06/24/2011, 05/30/2008  Findings:  ACR Breast Density Category c: The breasts are heterogeneously  dense, which may obscure small masses.  Additional views confirm the presence of a 7 x 7 x 6 mm spiculated  mass in the right lower inner quadrant posteriorly.  Mammographic images were processed with CAD.  On physical exam, no mass is palpated in the right lower inner  quadrant.  Ultrasound is performed, showing an irregular mass at 3:30 o'clock  6 cm from the right nipple measuring 5 x 4 x 5 mm. Sonography of  the right axilla demonstrates no abnormal lymph nodes.  IMPRESSION:  Spiculated mass at 3:30 o'clock 6 cm from the right nipple.  Appearance is suspicious for invasive mammary carcinoma.  RECOMMENDATION:  Ultrasound- guided core needle biopsy is recommended. This will be  scheduled at a convenient time for the patient. She could not stay  today due to transportation issues.  I have discussed the findings and recommendations with the patient.  Results were also provided in writing at the conclusion of the  visit. If applicable, a reminder letter will be sent to the  patient regarding the next appointment.    Assessment    Right breast cancer, lower inner, clinical stage I    Plan    Right breast wire guided lumpectomy, right axillary sentinel node biopsy We decided to omit mri as I don't think she needs to undergo this.   We discussed the staging and pathophysiology of breast cancer. We discussed all of the different options for treatment for breast cancer including surgery, chemotherapy, radiation therapy, Herceptin, and antiestrogen therapy.   We discussed a sentinel lymph node biopsy as she does not appear to having lymph node involvement right now. We discussed the performance of that with injection of  radioactive tracer and blue dye. We discussed that she would have an incision underneath her axillary hairline. We discussed that there is a bout a 10-20% chance of having a positive node with a sentinel lymph node biopsy and we will await the permanent pathology to make any other first further decisions in terms of her treatment. One of these options might be to return to the operating room to perform an axillary lymph node dissection. We discussed about a 1-2% risk lifetime of chronic shoulder pain as well as lymphedema associated with a sentinel lymph node biopsy.  We discussed the options for treatment of the breast cancer which included lumpectomy versus a mastectomy. We discussed the performance of the lumpectomy with a wire placement. We discussed a 10-20% chance of a positive margin requiring reexcision in the operating room. We also discussed that she may need radiation therapy or antiestrogen therapy or both if she undergoes lumpectomy. We discussed the mastectomy and the postoperative care for that  as well. We discussed that there is no difference in her survival whether she undergoes lumpectomy with radiation therapy or antiestrogen therapy versus a mastectomy. There is a slight difference in the local recurrence rate being 3-5% with lumpectomy and about 1% with a mastectomy. We discussed the risks of operation including bleeding, infection, possible reoperation. She understands her further therapy will be based on what her stages at the time of her operation.         Mohsin Crum 09/28/2012, 12:32 PM

## 2012-09-28 NOTE — Progress Notes (Signed)
Checked in new pt with no financial concerns. °

## 2012-09-28 NOTE — Progress Notes (Signed)
Radiation Oncology         [phone]) [phone] ________________________________  Initial outpatient Consultation - Date: 09/28/2012   Name: Shawna Hawkins MRN: [id number]   DOB: 08/03/1945  REFERRING PHYSICIAN: Emelia Loron, MD  DIAGNOSIS: T1N0 invasive right breast cancer of the lower inner quadrant  HISTORY OF PRESENT ILLNESS::Shawna Hawkins is a 67 y.o. female  nanogram. She was noted to have a mass in the lower inner quadrant. This measured 7 x 7 x 6 mm on mammogram. Ultrasound is slightly smaller and 5 x 4 x 5 mm. MRI scheduled for Friday. A biopsy was performed which showed a grade 1 invasive ductal carcinoma which was ER positive PR positive HER-2 negative. She was referred to clinic today. He has had some bruising since her biopsy but otherwise is feeling well. She had her first menses at the age of 36. She is postmenopausal. She is not on hormone replacement therapy. She has GX P3 with her first childbirth 53.  PREVIOUS RADIATION THERAPY: No  PAST MEDICAL HISTORY:  has a past medical history of Depression; Anxiety; and Hyperlipidemia.    PAST SURGICAL HISTORY: Past Surgical History  Procedure Laterality Date  . Polypectomy  2008  . Cholecystectomy    . Multiple tooth extractions    . Tonsillectomy      FAMILY HISTORY:  Family History  Problem Relation Age of Onset  . Depression Mother   . Cancer Mother     lung    SOCIAL HISTORY:  History  Substance Use Topics  . Smoking status: Current Every Day Smoker -- 0.30 packs/day for 30 years    Types: Cigarettes  . Smokeless tobacco: Not on file     Comment: "trying to quit"  . Alcohol Use: No    ALLERGIES: Codeine sulfate; Hydroxyzine hcl; Oxycodone-acetaminophen; Oxycodone-aspirin; and Penicillins  MEDICATIONS:  Current Outpatient Prescriptions  Medication Sig Dispense Refill  . alendronate (FOSAMAX) 70 MG tablet Take 1 tablet (70 mg total) by mouth every 7 (seven) days. Take with a full glass of water on an empty  stomach.  4 tablet  12  . ALPRAZolam (XANAX) 1 MG tablet Take 1 tablet (1 mg total) by mouth 3 (three) times daily as needed for sleep or anxiety.  90 tablet  5  . atorvastatin (LIPITOR) 40 MG tablet Take 1 tablet (40 mg total) by mouth daily.  90 tablet  11  . FLUoxetine (PROZAC) 20 MG capsule Take 1 capsule (20 mg total) by mouth daily.  90 capsule  0  . omeprazole (PRILOSEC) 40 MG capsule Take 1 capsule (40 mg total) by mouth daily.  90 capsule  11   No current facility-administered medications for this encounter.    REVIEW OF SYSTEMS:  A 15 point review of systems is documented in the electronic medical record. This was obtained by the nursing staff. However, I reviewed this with the patient to discuss relevant findings and make appropriate changes.  Pertinent items are noted in HPI.   PHYSICAL EXAM: There were no vitals filed for this visit.. . She is a pleasant female who appears older than her stated age. She is thin. She has ptotic breasts bilaterally. She has bruising with an associated palpable mass in the lower inner quadrant of the right breast. No palpable abnormalities of the left breast. She has no palpable axillary or supraclavicular adenopathy bilaterally. She is alert minus x3.  LABORATORY DATA:  Lab Results  Component Value Date   WBC 9.2 09/28/2012   HGB 13.5  09/28/2012   HCT 38.9 09/28/2012   MCV 92.6 09/28/2012   PLT 270 09/28/2012   Lab Results  Component Value Date   NA 140 09/28/2012   K 3.9 09/28/2012   CL 101 11/17/2011   CO2 27 09/28/2012   Lab Results  Component Value Date   ALT 7 09/28/2012   AST 17 09/28/2012   ALKPHOS 83 09/28/2012   BILITOT 0.42 09/28/2012     RADIOGRAPHY: US Breast Right  09/13/2012   *RADIOLOGY REPORT*  Clinical Data:  The patient returns for evaluation of a possible mass in the right breast noted on recent screening study dated 08/25/2012.  DIGITAL DIAGNOSTIC RIGHT LIMITED MAMMOGRAM  AND RIGHT BREAST ULTRASOUND:  Comparison:  06/24/2011,  05/30/2008  Findings:  ACR Breast Density Category c:  The breasts are heterogeneously dense, which may obscure small masses.  Additional views confirm the presence of a 7 x 7 x 6 mm spiculated mass in the right lower inner quadrant posteriorly.  Mammographic images were processed with CAD.  On physical exam, no mass is palpated in the right lower inner quadrant.  Ultrasound is performed, showing an irregular mass at 3:30 o'clock 6 cm from the right nipple measuring 5 x 4 x 5 mm.  Sonography of the right axilla demonstrates no abnormal lymph nodes.  IMPRESSION: Spiculated mass at 3:30 o'clock 6 cm from the right nipple. Appearance is suspicious for invasive mammary carcinoma.  RECOMMENDATION: Ultrasound- guided core needle biopsy is recommended.  This will be scheduled at a convenient time for the patient.  She could not stay today due to transportation issues.  I have discussed the findings and recommendations with the patient. Results were also provided in writing at the conclusion of the visit.  If applicable, a reminder letter will be sent to the patient regarding the next appointment.  BI-RADS CATEGORY 5:  Highly suggestive of malignancy - appropriate action should be taken.   Original Report Authenticated By: Cain Saupe, M.D.   [facility] R  09/13/2012   *RADIOLOGY REPORT*  Clinical Data:  The patient returns for evaluation of a possible mass in the right breast noted on recent screening study dated 08/25/2012.  DIGITAL DIAGNOSTIC RIGHT LIMITED MAMMOGRAM  AND RIGHT BREAST ULTRASOUND:  Comparison:  06/24/2011, 05/30/2008  Findings:  ACR Breast Density Category c:  The breasts are heterogeneously dense, which may obscure small masses.  Additional views confirm the presence of a 7 x 7 x 6 mm spiculated mass in the right lower inner quadrant posteriorly.  Mammographic images were processed with CAD.  On physical exam, no mass is palpated in the right lower inner quadrant.  Ultrasound is performed,  showing an irregular mass at 3:30 o'clock 6 cm from the right nipple measuring 5 x 4 x 5 mm.  Sonography of the right axilla demonstrates no abnormal lymph nodes.  IMPRESSION: Spiculated mass at 3:30 o'clock 6 cm from the right nipple. Appearance is suspicious for invasive mammary carcinoma.  RECOMMENDATION: Ultrasound- guided core needle biopsy is recommended.  This will be scheduled at a convenient time for the patient.  She could not stay today due to transportation issues.  I have discussed the findings and recommendations with the patient. Results were also provided in writing at the conclusion of the visit.  If applicable, a reminder letter will be sent to the patient regarding the next appointment.  BI-RADS CATEGORY 5:  Highly suggestive of malignancy - appropriate action should be taken.   Original Report  Authenticated By: Cain Saupe, M.D.   [facility] R  09/22/2012   *RADIOLOGY REPORT*  Clinical Data: The patient is post ultrasound guided core biopsy of a 5 mm irregular mass over the 03:30 position of the right breast 6 cm from the nipple.  ULTRASOUND GUIDED POST-BIOPSY CLIP PLACEMENT RIGHT  Comparison:  Previous exams.  Findings: Examination demonstrates satisfactory placement of a cylindrical metallic clip over the biopsied mass in the inner mid to lower right breast.  Remainder of the exam is unchanged.  IMPRESSION: Satisfactory clip placement post ultrasound guided core right breast biopsy.   Original Report Authenticated By: Elberta Fortis, M.D.   [place] Rt Breast Bx W Loc Dev 1st Lesion Img Bx Spec US Guide  09/23/2012   **ADDENDUM** CREATED: 09/23/2012 11:14:01  Grade 1 invasive ductal carcinoma was reported histologically. This corresponds well with the imaging findings.  The patient was contacted by telephone and given the results of the biopsy.  She states the biopsy site is clean and dry with no signs of hematoma or infection.  The patient will be seen at the  [facility] on 09/28/2012.  Breast MRI will be arranged at the patient's convenience.  **END ADDENDUM** SIGNED BY: Dina L. Judyann Munson, M.D.  09/22/2012   *RADIOLOGY REPORT*  Clinical Data:  Patient presents for ultrasound-guided core biopsy of a 5 mm irregular mass at the 03:30 position of the right breast.  ULTRASOUND GUIDED CORE BIOPSY OF THE right BREAST  Comparison: Previous exams.  I met with the patient and we discussed the procedure of ultrasound- guided biopsy, including benefits and alternatives.  We discussed the high likelihood of a successful procedure. We discussed the risks of the procedure, including infection, bleeding, tissue injury, clip migration, and inadequate sampling.  Informed written consent was given. The usual time-out protocol was performed immediately prior to the procedure.  Using sterile technique and 2% Lidocaine as local anesthetic, under direct ultrasound visualization, a 14 gauge spring-loaded device was used to perform biopsy of 5 mm irregular mass at the 03:30 position using a inferior to superior approach.  At the conclusion of the procedure a cylindrical shaped tissue marker clip was deployed into the biopsy cavity.  Follow up 2 view mammogram was performed and dictated separately.  IMPRESSION: Ultrasound guided biopsy of a suspicious 5 mm right breast mass. No apparent complications.  Original Report Authenticated By: Elberta Fortis, M.D.      IMPRESSION: T1 N0 invasive left breast cancer of the lower inner quadrant  PLAN: I discussed with the patient the equivalency in terms of survival between mastectomy and lumpectomy. We discussed the role of radiation and decreasing local failures in patients who undergo lumpectomy. We discussed the process of simulation the placement tattoos. We discussed possible side effects treatment including but not limited to skin redness and fatigue. We discussed the increase in lung cancer seen in patients who receive radiation and  continued to smoke. I encouraged her to pursue smoking cessation. She has quit in the past and is interested in quitting. We will give her smoking cessation resources. She also did not receive one of our a light journey back and we'll make sure she receives one of those as well. We discussed that radiation usually begins about 4-6 weeks after surgery and can be hindered if she would ultimately need chemotherapy which is of course given prior to radiation. We also discussed that her tumor is very clearly seen on her mammogram and we did not feel her  MRI provided any clinical benefit. She was quite anxious about receiving this anyway do to extreme anxiety and claustrophobia. She is in agreement to cancel this.  I spent 40 minutes  face to face with the patient and more than 50% of that time was spent in counseling and/or coordination of care.   ------------------------------------------------  Lurline Hare, MD

## 2012-09-30 ENCOUNTER — Other Ambulatory Visit: Payer: Medicare Other

## 2012-09-30 ENCOUNTER — Other Ambulatory Visit (INDEPENDENT_AMBULATORY_CARE_PROVIDER_SITE_OTHER): Payer: Self-pay | Admitting: General Surgery

## 2012-09-30 DIAGNOSIS — C50311 Malignant neoplasm of lower-inner quadrant of right female breast: Secondary | ICD-10-CM

## 2012-10-03 ENCOUNTER — Telehealth: Payer: Self-pay | Admitting: *Deleted

## 2012-10-03 ENCOUNTER — Encounter (HOSPITAL_COMMUNITY): Payer: Self-pay | Admitting: Pharmacy Technician

## 2012-10-03 ENCOUNTER — Other Ambulatory Visit: Payer: Self-pay | Admitting: *Deleted

## 2012-10-03 NOTE — Telephone Encounter (Signed)
Called and spoke with patient from [facility] 09/28/12.  Confirmed follow up with Dr. Darnelle Catalan 11/07/12 at 1100. Denies any further questions at this time.

## 2012-10-05 ENCOUNTER — Telehealth (INDEPENDENT_AMBULATORY_CARE_PROVIDER_SITE_OTHER): Payer: Self-pay

## 2012-10-05 DIAGNOSIS — C50911 Malignant neoplasm of unspecified site of right female breast: Secondary | ICD-10-CM

## 2012-10-05 NOTE — Telephone Encounter (Signed)
Requesting order to be placed in epic for radiation oncology.

## 2012-10-06 ENCOUNTER — Telehealth: Payer: Self-pay | Admitting: *Deleted

## 2012-10-06 NOTE — Telephone Encounter (Signed)
Pt called to discuss her appts.  Went over pts appts in detail.  Pt also requested information on her appts for the last years.  I retrieved all this information and mailed to her address.  Pt denies further needs at this time.  Encourage pt to call with further questions or concerns.

## 2012-10-11 ENCOUNTER — Encounter (HOSPITAL_COMMUNITY)
Admission: RE | Admit: 2012-10-11 | Discharge: 2012-10-11 | Disposition: A | Discharge status: Home / Self Care | Payer: Medicare Other | Source: Ambulatory Visit | Attending: General Surgery | Admitting: General Surgery

## 2012-10-11 ENCOUNTER — Encounter (HOSPITAL_COMMUNITY): Payer: Self-pay

## 2012-10-11 DIAGNOSIS — Z170 Estrogen receptor positive status [ER+]: Secondary | ICD-10-CM | POA: Diagnosis not present

## 2012-10-11 DIAGNOSIS — F17200 Nicotine dependence, unspecified, uncomplicated: Secondary | ICD-10-CM | POA: Diagnosis not present

## 2012-10-11 DIAGNOSIS — C50319 Malignant neoplasm of lower-inner quadrant of unspecified female breast: Secondary | ICD-10-CM | POA: Diagnosis not present

## 2012-10-11 DIAGNOSIS — Z888 Allergy status to other drugs, medicaments and biological substances status: Secondary | ICD-10-CM | POA: Diagnosis not present

## 2012-10-11 DIAGNOSIS — F329 Major depressive disorder, single episode, unspecified: Secondary | ICD-10-CM | POA: Diagnosis not present

## 2012-10-11 DIAGNOSIS — Z885 Allergy status to narcotic agent status: Secondary | ICD-10-CM | POA: Diagnosis not present

## 2012-10-11 DIAGNOSIS — E785 Hyperlipidemia, unspecified: Secondary | ICD-10-CM | POA: Diagnosis not present

## 2012-10-11 DIAGNOSIS — Z79899 Other long term (current) drug therapy: Secondary | ICD-10-CM | POA: Diagnosis not present

## 2012-10-11 DIAGNOSIS — F411 Generalized anxiety disorder: Secondary | ICD-10-CM | POA: Diagnosis not present

## 2012-10-11 DIAGNOSIS — Z880 Allergy status to penicillin: Secondary | ICD-10-CM | POA: Diagnosis not present

## 2012-10-11 HISTORY — DX: Heat exhaustion, unspecified, initial encounter: T67.5XXA

## 2012-10-11 HISTORY — DX: Anemia, unspecified: D64.9

## 2012-10-11 HISTORY — DX: Bronchitis, not specified as acute or chronic: J40

## 2012-10-11 HISTORY — DX: Chronic obstructive pulmonary disease, unspecified: J44.9

## 2012-10-11 LAB — CBC
Platelets: 201 10*3/uL (ref 150–400)
RBC: 4.19 MIL/uL (ref 3.87–5.11)
WBC: 7.7 10*3/uL (ref 4.0–10.5)

## 2012-10-11 LAB — BASIC METABOLIC PANEL
Calcium: 9.8 mg/dL (ref 8.4–10.5)
GFR calc Af Amer: 84 mL/min — ABNORMAL LOW (ref >90)
GFR calc non Af Amer: 72 mL/min — ABNORMAL LOW (ref >90)
Sodium: 138 mEq/L (ref 135–145)

## 2012-10-11 NOTE — Pre-Procedure Instructions (Signed)
Chivonne Rascon  10/11/2012   Your procedure is scheduled on:  Thurs, July 31 @ 10:00 AM  Report to [facility] at 8:00 AM.  Call this number if you have problems the morning of surgery: [phone]   Remember:   Do not eat food or drink liquids after midnight.   Take these medicines the morning of surgery with A SIP OF WATER: Alprazolam(Xanax),Prozac(Fluoxetine),and Omeprazole(Prilosec)   Do not wear jewelry, make-up or nail polish.  Do not wear lotions, powders, or perfumes. You may wear deodorant.  Do not shave 48 hours prior to surgery.   Do not bring valuables to the hospital.  [facility] is not responsible                   for any belongings or valuables.  Contacts, dentures or bridgework may not be worn into surgery.  Leave suitcase in the car. After surgery it may be brought to your room.  For patients admitted to the hospital, checkout time is 11:00 AM the day of  discharge.   Patients discharged the day of surgery will not be allowed to drive  home.    Special Instructions: Shower using CHG 2 nights before surgery and the night before surgery.  If you shower the day of surgery use CHG.  Use special wash - you have one bottle of CHG for all showers.  You should use approximately 1/3 of the bottle for each shower.   Please read over the following fact sheets that you were given: Pain Booklet, Coughing and Deep Breathing and Surgical Site Infection Prevention

## 2012-10-12 MED ORDER — VANCOMYCIN HCL IN DEXTROSE 1-5 GM/200ML-% IV SOLN
1000.0000 mg | INTRAVENOUS | Status: AC
  Administered 2012-10-13: 1000 mg via INTRAVENOUS
  Filled 2012-10-12: qty 200

## 2012-10-13 ENCOUNTER — Encounter (HOSPITAL_COMMUNITY): Payer: Self-pay | Admitting: Anesthesiology

## 2012-10-13 ENCOUNTER — Ambulatory Visit (HOSPITAL_COMMUNITY): Payer: Medicare Other | Admitting: Anesthesiology

## 2012-10-13 ENCOUNTER — Ambulatory Visit
Admission: RE | Admit: 2012-10-13 | Discharge: 2012-10-13 | Disposition: A | Discharge status: Home / Self Care | Payer: Medicare Other | Source: Ambulatory Visit | Attending: General Surgery | Admitting: General Surgery

## 2012-10-13 ENCOUNTER — Encounter (HOSPITAL_COMMUNITY)
Admission: RE | Disposition: A | Discharge status: Home / Self Care | Payer: Self-pay | Source: Ambulatory Visit | Attending: General Surgery

## 2012-10-13 ENCOUNTER — Encounter (HOSPITAL_COMMUNITY)
Admission: RE | Admit: 2012-10-13 | Discharge: 2012-10-13 | Disposition: A | Discharge status: Home / Self Care | Payer: Medicare Other | Source: Ambulatory Visit | Attending: General Surgery | Admitting: General Surgery

## 2012-10-13 ENCOUNTER — Ambulatory Visit (HOSPITAL_COMMUNITY)
Admission: RE | Admit: 2012-10-13 | Discharge: 2012-10-13 | Disposition: A | Discharge status: Home / Self Care | Payer: Medicare Other | Source: Ambulatory Visit | Attending: General Surgery | Admitting: General Surgery

## 2012-10-13 DIAGNOSIS — C50319 Malignant neoplasm of lower-inner quadrant of unspecified female breast: Secondary | ICD-10-CM | POA: Diagnosis not present

## 2012-10-13 DIAGNOSIS — J449 Chronic obstructive pulmonary disease, unspecified: Secondary | ICD-10-CM | POA: Diagnosis not present

## 2012-10-13 DIAGNOSIS — F17200 Nicotine dependence, unspecified, uncomplicated: Secondary | ICD-10-CM | POA: Insufficient documentation

## 2012-10-13 DIAGNOSIS — Z888 Allergy status to other drugs, medicaments and biological substances status: Secondary | ICD-10-CM | POA: Insufficient documentation

## 2012-10-13 DIAGNOSIS — E785 Hyperlipidemia, unspecified: Secondary | ICD-10-CM | POA: Diagnosis not present

## 2012-10-13 DIAGNOSIS — F329 Major depressive disorder, single episode, unspecified: Secondary | ICD-10-CM | POA: Diagnosis not present

## 2012-10-13 DIAGNOSIS — Z885 Allergy status to narcotic agent status: Secondary | ICD-10-CM | POA: Insufficient documentation

## 2012-10-13 DIAGNOSIS — F411 Generalized anxiety disorder: Secondary | ICD-10-CM | POA: Diagnosis not present

## 2012-10-13 DIAGNOSIS — C50311 Malignant neoplasm of lower-inner quadrant of right female breast: Secondary | ICD-10-CM

## 2012-10-13 DIAGNOSIS — F3289 Other specified depressive episodes: Secondary | ICD-10-CM | POA: Insufficient documentation

## 2012-10-13 DIAGNOSIS — Z880 Allergy status to penicillin: Secondary | ICD-10-CM | POA: Insufficient documentation

## 2012-10-13 DIAGNOSIS — Z170 Estrogen receptor positive status [ER+]: Secondary | ICD-10-CM | POA: Insufficient documentation

## 2012-10-13 DIAGNOSIS — D0590 Unspecified type of carcinoma in situ of unspecified breast: Secondary | ICD-10-CM

## 2012-10-13 DIAGNOSIS — Z79899 Other long term (current) drug therapy: Secondary | ICD-10-CM | POA: Insufficient documentation

## 2012-10-13 DIAGNOSIS — C50919 Malignant neoplasm of unspecified site of unspecified female breast: Secondary | ICD-10-CM | POA: Diagnosis not present

## 2012-10-13 HISTORY — PX: BREAST LUMPECTOMY WITH NEEDLE LOCALIZATION AND AXILLARY SENTINEL LYMPH NODE BX: SHX5760

## 2012-10-13 SURGERY — BREAST LUMPECTOMY WITH NEEDLE LOCALIZATION AND AXILLARY SENTINEL LYMPH NODE BX
Anesthesia: General | Site: Breast | Laterality: Right | Wound class: Clean

## 2012-10-13 MED ORDER — FENTANYL CITRATE 0.05 MG/ML IJ SOLN
INTRAMUSCULAR | Status: AC
  Filled 2012-10-13: qty 2

## 2012-10-13 MED ORDER — TECHNETIUM TC 99M SULFUR COLLOID FILTERED: 1.0000 | Freq: Once | INTRAVENOUS | Status: AC | PRN

## 2012-10-13 MED ORDER — ONDANSETRON HCL 4 MG/2ML IJ SOLN
INTRAMUSCULAR | Status: DC | PRN
  Administered 2012-10-13: 4 mg via INTRAVENOUS

## 2012-10-13 MED ORDER — DEXAMETHASONE SODIUM PHOSPHATE 4 MG/ML IJ SOLN
INTRAMUSCULAR | Status: DC | PRN
  Administered 2012-10-13: 4 mg via INTRAVENOUS

## 2012-10-13 MED ORDER — PROMETHAZINE HCL 12.5 MG PO TABS
12.5000 mg | ORAL_TABLET | Freq: Four times a day (QID) | ORAL | Status: DC | PRN
Start: 2012-10-13 — End: 2013-01-12

## 2012-10-13 MED ORDER — MIDAZOLAM HCL 5 MG/5ML IJ SOLN
INTRAMUSCULAR | Status: DC | PRN
  Administered 2012-10-13: 1 mg via INTRAVENOUS

## 2012-10-13 MED ORDER — SODIUM CHLORIDE 0.9 % IJ SOLN
INTRAMUSCULAR | Status: DC | PRN
  Administered 2012-10-13: 11:00:00

## 2012-10-13 MED ORDER — HYDROMORPHONE HCL 2 MG PO TABS: 2.0000 mg | ORAL_TABLET | Freq: Four times a day (QID) | ORAL | Status: DC | PRN

## 2012-10-13 MED ORDER — ONDANSETRON HCL 4 MG/2ML IJ SOLN: 4.0000 mg | Freq: Once | INTRAMUSCULAR | Status: DC | PRN

## 2012-10-13 MED ORDER — BUPIVACAINE-EPINEPHRINE 0.25% -1:200000 IJ SOLN
INTRAMUSCULAR | Status: DC | PRN
  Administered 2012-10-13: 28 mL

## 2012-10-13 MED ORDER — HYDROMORPHONE HCL PF 1 MG/ML IJ SOLN: 0.2500 mg | INTRAMUSCULAR | Status: DC | PRN

## 2012-10-13 MED ORDER — GLYCOPYRROLATE 0.2 MG/ML IJ SOLN
INTRAMUSCULAR | Status: DC | PRN
  Administered 2012-10-13 (×2): 0.1 mg via INTRAVENOUS

## 2012-10-13 MED ORDER — FENTANYL CITRATE 0.05 MG/ML IJ SOLN
50.0000 ug | INTRAMUSCULAR | Status: DC | PRN
  Administered 2012-10-13: 100 ug via INTRAVENOUS

## 2012-10-13 MED ORDER — LIDOCAINE HCL (CARDIAC) 20 MG/ML IV SOLN
INTRAVENOUS | Status: DC | PRN
  Administered 2012-10-13: 50 mg via INTRAVENOUS

## 2012-10-13 MED ORDER — ARTIFICIAL TEARS OP OINT
TOPICAL_OINTMENT | OPHTHALMIC | Status: DC | PRN
  Administered 2012-10-13: 1 via OPHTHALMIC

## 2012-10-13 MED ORDER — 0.9 % SODIUM CHLORIDE (POUR BTL) OPTIME
TOPICAL | Status: DC | PRN
  Administered 2012-10-13: 1000 mL

## 2012-10-13 MED ORDER — FENTANYL CITRATE 0.05 MG/ML IJ SOLN
INTRAMUSCULAR | Status: DC | PRN
  Administered 2012-10-13: 25 ug via INTRAVENOUS
  Administered 2012-10-13: 50 ug via INTRAVENOUS
  Administered 2012-10-13: 25 ug via INTRAVENOUS

## 2012-10-13 MED ORDER — PROPOFOL 10 MG/ML IV BOLUS
INTRAVENOUS | Status: DC | PRN
  Administered 2012-10-13: 150 mg via INTRAVENOUS

## 2012-10-13 MED ORDER — BUPIVACAINE-EPINEPHRINE PF 0.25-1:200000 % IJ SOLN
INTRAMUSCULAR | Status: AC
  Filled 2012-10-13: qty 30

## 2012-10-13 MED ORDER — MIDAZOLAM HCL 2 MG/2ML IJ SOLN: 1.0000 mg | INTRAMUSCULAR | Status: DC | PRN

## 2012-10-13 MED ORDER — METHYLENE BLUE 1 % INJ SOLN
INTRAMUSCULAR | Status: AC
  Filled 2012-10-13: qty 10

## 2012-10-13 MED ORDER — LACTATED RINGERS IV SOLN
INTRAVENOUS | Status: DC | PRN
  Administered 2012-10-13: 10:00:00 via INTRAVENOUS

## 2012-10-13 MED ORDER — LACTATED RINGERS IV SOLN
INTRAVENOUS | Status: DC
  Administered 2012-10-13: 10:00:00 via INTRAVENOUS

## 2012-10-13 SURGICAL SUPPLY — 58 items
ADH SKN CLS APL DERMABOND .7 (GAUZE/BANDAGES/DRESSINGS) ×1
APL SKNCLS STERI-STRIP NONHPOA (GAUZE/BANDAGES/DRESSINGS)
APPLIER CLIP 9.375 MED OPEN (MISCELLANEOUS) ×2
APR CLP MED 9.3 20 MLT OPN (MISCELLANEOUS) ×1
BENZOIN TINCTURE PRP APPL 2/3 (GAUZE/BANDAGES/DRESSINGS) IMPLANT
BINDER BREAST LRG (GAUZE/BANDAGES/DRESSINGS) ×1 IMPLANT
BINDER BREAST XLRG (GAUZE/BANDAGES/DRESSINGS) IMPLANT
BLADE SURG 10 STRL SS (BLADE) ×2 IMPLANT
BLADE SURG 15 STRL LF DISP TIS (BLADE) ×1 IMPLANT
BLADE SURG 15 STRL SS (BLADE) ×2
CANISTER SUCTION 2500CC (MISCELLANEOUS) ×2 IMPLANT
CHLORAPREP W/TINT 26ML (MISCELLANEOUS) ×2 IMPLANT
CLIP APPLIE 9.375 MED OPEN (MISCELLANEOUS) ×1 IMPLANT
CLOTH BEACON ORANGE TIMEOUT ST (SAFETY) ×2 IMPLANT
CONT SPEC STER OR (MISCELLANEOUS) ×4 IMPLANT
COVER PROBE W GEL 5X96 (DRAPES) ×2 IMPLANT
COVER SURGICAL LIGHT HANDLE (MISCELLANEOUS) ×2 IMPLANT
DECANTER SPIKE VIAL GLASS SM (MISCELLANEOUS) ×1 IMPLANT
DERMABOND ADVANCED (GAUZE/BANDAGES/DRESSINGS) ×1
DERMABOND ADVANCED .7 DNX12 (GAUZE/BANDAGES/DRESSINGS) IMPLANT
DEVICE DUBIN SPECIMEN MAMMOGRA (MISCELLANEOUS) ×2 IMPLANT
DRAPE CHEST BREAST 15X10 FENES (DRAPES) ×2 IMPLANT
ELECT CAUTERY BLADE 6.4 (BLADE) ×2 IMPLANT
ELECT REM PT RETURN 9FT ADLT (ELECTROSURGICAL) ×2
ELECTRODE REM PT RTRN 9FT ADLT (ELECTROSURGICAL) ×1 IMPLANT
GLOVE BIO SURGEON STRL SZ7 (GLOVE) ×3 IMPLANT
GLOVE BIO SURGEON STRL SZ7.5 (GLOVE) ×1 IMPLANT
GLOVE BIOGEL PI IND STRL 7.5 (GLOVE) ×1 IMPLANT
GLOVE BIOGEL PI INDICATOR 7.5 (GLOVE) ×3
GOWN STRL NON-REIN LRG LVL3 (GOWN DISPOSABLE) ×2 IMPLANT
KIT BASIN OR (CUSTOM PROCEDURE TRAY) ×2 IMPLANT
KIT MARKER MARGIN INK (KITS) ×1 IMPLANT
KIT ROOM TURNOVER OR (KITS) ×2 IMPLANT
NDL 18GX1X1/2 (RX/OR ONLY) (NEEDLE) ×1 IMPLANT
NDL HYPO 25GX1X1/2 BEV (NEEDLE) ×2 IMPLANT
NEEDLE 18GX1X1/2 (RX/OR ONLY) (NEEDLE) ×2 IMPLANT
NEEDLE HYPO 25GX1X1/2 BEV (NEEDLE) ×4 IMPLANT
NS IRRIG 1000ML POUR BTL (IV SOLUTION) ×2 IMPLANT
PACK SURGICAL SETUP 50X90 (CUSTOM PROCEDURE TRAY) ×2 IMPLANT
PAD ARMBOARD 7.5X6 YLW CONV (MISCELLANEOUS) ×2 IMPLANT
PENCIL BUTTON HOLSTER BLD 10FT (ELECTRODE) ×2 IMPLANT
SPONGE GAUZE 4X4 12PLY (GAUZE/BANDAGES/DRESSINGS) ×1 IMPLANT
SPONGE LAP 18X18 X RAY DECT (DISPOSABLE) ×2 IMPLANT
STAPLER VISISTAT 35W (STAPLE) ×2 IMPLANT
STOCKINETTE IMPERVIOUS 9X36 MD (GAUZE/BANDAGES/DRESSINGS) IMPLANT
STRIP CLOSURE SKIN 1/2X4 (GAUZE/BANDAGES/DRESSINGS) ×1 IMPLANT
SUT MNCRL AB 4-0 PS2 18 (SUTURE) ×4 IMPLANT
SUT SILK 2 0 SH (SUTURE) IMPLANT
SUT VIC AB 2-0 SH 27 (SUTURE) ×4
SUT VIC AB 2-0 SH 27XBRD (SUTURE) ×2 IMPLANT
SUT VIC AB 3-0 SH 27 (SUTURE) ×4
SUT VIC AB 3-0 SH 27XBRD (SUTURE) ×2 IMPLANT
SYR CONTROL 10ML LL (SYRINGE) ×4 IMPLANT
TOWEL OR 17X24 6PK STRL BLUE (TOWEL DISPOSABLE) ×2 IMPLANT
TOWEL OR 17X26 10 PK STRL BLUE (TOWEL DISPOSABLE) ×2 IMPLANT
TOWEL OR NON WOVEN STRL DISP B (DISPOSABLE) ×1 IMPLANT
TUBE CONNECTING 12X1/4 (SUCTIONS) ×2 IMPLANT
YANKAUER SUCT BULB TIP NO VENT (SUCTIONS) ×2 IMPLANT

## 2012-10-13 NOTE — Anesthesia Preprocedure Evaluation (Addendum)
Anesthesia Evaluation  Patient identified by MRN, date of birth, ID band Patient awake    Reviewed: Allergy & Precautions, H&P , NPO status , Patient's Chart, lab work & pertinent test results  Airway Mallampati: II      Dental  (+) Edentulous Lower and Edentulous Upper   Pulmonary COPDCurrent Smoker,  breath sounds clear to auscultation        Cardiovascular Rhythm:Regular Rate:Bradycardia     Neuro/Psych Anxiety Depression    GI/Hepatic GERD-  Medicated and Controlled,  Endo/Other    Renal/GU      Musculoskeletal   Abdominal   Peds  Hematology   Anesthesia Other Findings   Reproductive/Obstetrics                          Anesthesia Physical Anesthesia Plan  ASA: II  Anesthesia Plan: General   Post-op Pain Management:    Induction: Intravenous  Airway Management Planned: LMA  Additional Equipment:   Intra-op Plan:   Post-operative Plan:   Informed Consent: I have reviewed the patients History and Physical, chart, labs and discussed the procedure including the risks, benefits and alternatives for the proposed anesthesia with the patient or authorized representative who has indicated his/her understanding and acceptance.     Plan Discussed with: CRNA and Anesthesiologist  Anesthesia Plan Comments: (R. Breast Ca Smoker  GERD H/O post-op N/V  Plan GA with LMA)        Anesthesia Quick Evaluation

## 2012-10-13 NOTE — Transfer of Care (Signed)
Immediate Anesthesia Transfer of Care Note  Patient: Shawna Hawkins  Procedure(s) Performed: Procedure(s): BREAST LUMPECTOMY WITH NEEDLE LOCALIZATION AND AXILLARY SENTINEL LYMPH NODE BX (Right)  Patient Location: PACU  Anesthesia Type:General  Level of Consciousness: awake, alert  and oriented  Airway & Oxygen Therapy: Patient Spontanous Breathing and Patient connected to nasal cannula oxygen  Post-op Assessment: Report given to PACU RN and Post -op Vital signs reviewed and stable  Post vital signs: Reviewed and stable  Complications: No apparent anesthesia complications

## 2012-10-13 NOTE — Anesthesia Procedure Notes (Signed)
Procedure Name: LMA Insertion Date/Time: 10/13/2012 10:28 AM Performed by: Gayla Medicus Pre-anesthesia Checklist: Patient identified, Timeout performed, Suction available, Emergency Drugs available and Patient being monitored Patient Re-evaluated:Patient Re-evaluated prior to inductionOxygen Delivery Method: Circle system utilized Preoxygenation: Pre-oxygenation with 100% oxygen Intubation Type: IV induction LMA: LMA inserted LMA Size: 4.0 Number of attempts: 1 Placement Confirmation: positive ETCO2 and breath sounds checked- equal and bilateral Tube secured with: Tape Dental Injury: Teeth and Oropharynx as per pre-operative assessment

## 2012-10-13 NOTE — H&P (View-Only) (Signed)
Patient ID: Shawna Hawkins, female   DOB: 09/01/1945, 67 y.o.   MRN: [id number]  Chief Complaint  Patient presents with  . Other    HPI Shawna Hawkins is a 67 y.o. female.  Referred by Dr. Dan Boyle HPI 67 yof who has significant anxiety who underwent screening mm with finding of a lower inner right breast mass. She had no complaints referable to either breast.  The mm shows a 7x7x6 mm mass and u/s shows a 5x4x5 mm mass.  She has mr scheduled for Friday which we discussed today and decided to omit.  She underwent a biopsy that shows a grade I invasive ductal carcinoma that is er/pr positive, her2 not amplified and Ki67 is 10%  She comes in today to discuss her options. Past Medical History  Diagnosis Date  . Depression   . Anxiety   . Hyperlipidemia     Past Surgical History  Procedure Laterality Date  . Polypectomy  2008  . Cholecystectomy    . Multiple tooth extractions    . Tonsillectomy      Family History  Problem Relation Age of Onset  . Depression Mother   . Cancer Mother     lung    Social History History  Substance Use Topics  . Smoking status: Current Every Day Smoker -- 1.00 packs/day for 30 years    Types: Cigarettes  . Smokeless tobacco: Not on file     Comment: "trying to quit"  . Alcohol Use: No    Allergies  Allergen Reactions  . Codeine Sulfate Nausea Only  . Hydroxyzine Hcl   . Oxycodone-Acetaminophen Nausea Only  . Oxycodone-Aspirin Nausea Only  . Penicillins     REACTION: itching    Current Outpatient Prescriptions  Medication Sig Dispense Refill  . alendronate (FOSAMAX) 70 MG tablet Take 1 tablet (70 mg total) by mouth every 7 (seven) days. Take with a full glass of water on an empty stomach.  4 tablet  12  . ALPRAZolam (XANAX) 1 MG tablet Take 1 tablet (1 mg total) by mouth 3 (three) times daily as needed for sleep or anxiety.  90 tablet  5  . atorvastatin (LIPITOR) 40 MG tablet Take 1 tablet (40 mg total) by mouth daily.  90 tablet  11  .  FLUoxetine (PROZAC) 20 MG capsule Take 1 capsule (20 mg total) by mouth daily.  90 capsule  0  . omeprazole (PRILOSEC) 40 MG capsule Take 1 capsule (40 mg total) by mouth daily.  90 capsule  11   No current facility-administered medications for this visit.    Review of Systems Review of Systems  Constitutional: Positive for chills. Negative for fever and unexpected weight change.  HENT: Positive for tinnitus. Negative for hearing loss, congestion, sore throat, trouble swallowing and voice change.   Eyes: Negative for visual disturbance.  Respiratory: Positive for shortness of breath. Negative for cough and wheezing.   Cardiovascular: Negative for chest pain, palpitations and leg swelling.  Gastrointestinal: Negative for nausea, vomiting, abdominal pain, diarrhea, constipation, blood in stool, abdominal distention and anal bleeding.  Genitourinary: Negative for hematuria, vaginal bleeding and difficulty urinating.  Musculoskeletal: Positive for back pain. Negative for arthralgias.  Skin: Negative for rash and wound.  Neurological: Negative for seizures, syncope and headaches.  Hematological: Negative for adenopathy. Does not bruise/bleed easily.  Psychiatric/Behavioral: Negative for confusion.    Last menstrual period 10/23/1982.  Physical Exam Physical Exam  Vitals reviewed. Constitutional: She appears well-developed and well-nourished.  Neck:   Neck supple.  Cardiovascular: Normal rate, regular rhythm and normal heart sounds.   Pulmonary/Chest: Effort normal and breath sounds normal. She has no wheezes. She has no rales. Right breast exhibits no inverted nipple, no mass, no nipple discharge, no skin change and no tenderness. Left breast exhibits no inverted nipple, no mass, no nipple discharge, no skin change and no tenderness. Breasts are symmetrical.  Lymphadenopathy:    She has no cervical adenopathy.    She has no axillary adenopathy.       Right: No supraclavicular adenopathy  present.       Left: No supraclavicular adenopathy present.    Data Reviewed DIGITAL DIAGNOSTIC RIGHT LIMITED MAMMOGRAM AND RIGHT BREAST  ULTRASOUND:  Comparison: 06/24/2011, 05/30/2008  Findings:  ACR Breast Density Category c: The breasts are heterogeneously  dense, which may obscure small masses.  Additional views confirm the presence of a 7 x 7 x 6 mm spiculated  mass in the right lower inner quadrant posteriorly.  Mammographic images were processed with CAD.  On physical exam, no mass is palpated in the right lower inner  quadrant.  Ultrasound is performed, showing an irregular mass at 3:30 o'clock  6 cm from the right nipple measuring 5 x 4 x 5 mm. Sonography of  the right axilla demonstrates no abnormal lymph nodes.  IMPRESSION:  Spiculated mass at 3:30 o'clock 6 cm from the right nipple.  Appearance is suspicious for invasive mammary carcinoma.  RECOMMENDATION:  Ultrasound- guided core needle biopsy is recommended. This will be  scheduled at a convenient time for the patient. She could not stay  today due to transportation issues.  I have discussed the findings and recommendations with the patient.  Results were also provided in writing at the conclusion of the  visit. If applicable, a reminder letter will be sent to the  patient regarding the next appointment.    Assessment    Right breast cancer, lower inner, clinical stage I    Plan    Right breast wire guided lumpectomy, right axillary sentinel node biopsy We decided to omit mri as I don't think she needs to undergo this.   We discussed the staging and pathophysiology of breast cancer. We discussed all of the different options for treatment for breast cancer including surgery, chemotherapy, radiation therapy, Herceptin, and antiestrogen therapy.   We discussed a sentinel lymph node biopsy as she does not appear to having lymph node involvement right now. We discussed the performance of that with injection of  radioactive tracer and blue dye. We discussed that she would have an incision underneath her axillary hairline. We discussed that there is a bout a 10-20% chance of having a positive node with a sentinel lymph node biopsy and we will await the permanent pathology to make any other first further decisions in terms of her treatment. One of these options might be to return to the operating room to perform an axillary lymph node dissection. We discussed about a 1-2% risk lifetime of chronic shoulder pain as well as lymphedema associated with a sentinel lymph node biopsy.  We discussed the options for treatment of the breast cancer which included lumpectomy versus a mastectomy. We discussed the performance of the lumpectomy with a wire placement. We discussed a 10-20% chance of a positive margin requiring reexcision in the operating room. We also discussed that she may need radiation therapy or antiestrogen therapy or both if she undergoes lumpectomy. We discussed the mastectomy and the postoperative care for that   as well. We discussed that there is no difference in her survival whether she undergoes lumpectomy with radiation therapy or antiestrogen therapy versus a mastectomy. There is a slight difference in the local recurrence rate being 3-5% with lumpectomy and about 1% with a mastectomy. We discussed the risks of operation including bleeding, infection, possible reoperation. She understands her further therapy will be based on what her stages at the time of her operation.         Candace Ramus 09/28/2012, 12:32 PM    

## 2012-10-13 NOTE — Interval H&P Note (Signed)
History and Physical Interval Note:  10/13/2012 10:10 AM  Shawna Hawkins  has presented today for surgery, with the diagnosis of RIGHT BREAST CANCER  The various methods of treatment have been discussed with the patient and family. After consideration of risks, benefits and other options for treatment, the patient has consented to  Procedure(s): BREAST LUMPECTOMY WITH NEEDLE LOCALIZATION AND AXILLARY SENTINEL LYMPH NODE BX (Right) as a surgical intervention .  The patient's history has been reviewed, patient examined, no change in status, stable for surgery.  I have reviewed the patient's chart and labs.  Questions were answered to the patient's satisfaction.     Miski Feldpausch

## 2012-10-13 NOTE — Preoperative (Signed)
Beta Blockers   Reason not to administer Beta Blockers:Not Applicable 

## 2012-10-13 NOTE — Anesthesia Postprocedure Evaluation (Signed)
  Anesthesia Post-op Note  Patient: Shawna Hawkins  Procedure(s) Performed: Procedure(s): BREAST LUMPECTOMY WITH NEEDLE LOCALIZATION AND AXILLARY SENTINEL LYMPH NODE BX (Right)  Patient Location: PACU  Anesthesia Type:General  Level of Consciousness: awake, alert  and oriented  Airway and Oxygen Therapy: Patient Spontanous Breathing and Patient connected to nasal cannula oxygen  Post-op Pain: mild  Post-op Assessment: Post-op Vital signs reviewed, Patient's Cardiovascular Status Stable, Respiratory Function Stable, Patent Airway and Pain level controlled  Post-op Vital Signs: stable  Complications: No apparent anesthesia complications

## 2012-10-13 NOTE — Op Note (Signed)
Preoperative diagnosis: Clinical stage I right breast cancer Postoperative diagnosis: Same as above Procedure: #1 right breast wire-guided lumpectomy #2 injection of blue dye for sentinel node identification #3 right axillary sentinel lymph node biopsy Surgeon: Dr. Harden Mo Anesthesia: Gen. Estimated blood loss: Minimal Complications: None Drains: None Specimens: #1 Right breast tissue marked with paint #2 Right axillary sentinel lymph node x3 with counts from 51-300 Sponge count correct at completion Disposition to recovery stable  Indications: This is 67 yo female who underwent a screening mammogram with the finding of a right lower inner quadrant mass. This underwent a biopsy showing it to be an invasive cancer. She had all of her options discussed with her we have elected to perform breast conservation therapy.  Procedure: After informed consent was obtained the patient first had a wire placed at lesion at the breast center. I had these mammograms available for my review. She was then injected with technetium in a standard periareolar fashion. She was then given 2 g of intravenous cefazolin. Sequential compression devices were placed on her legs. Her right breast and axilla were then prepped and draped in the standard sterile surgical fashion. A surgical timeout was performed.I then injected a saline and methylene blue mixture in a peri-areolar fashion massages for 2 minutes.  I elected to remove the breast cancer first. I injected Marcaine throughout this area. I made an inframammary incision and carried this up to the wire and brought this in from a remote position. I then used cautery to excise the wire and the surrounding tissue all the way down to and including the pectoralis fascia. I then painted this according to the instructions. I then did a Faxitron mammogram and confirmed removal of the wire, mass, and clip. This was confirmed by radiology. I obtained hemostasis. I then  placed 2 clips on the muscle and one clip in each position. I then lifted the breast tissue off the pectoralis muscle and brought it together with a 2-0 Vicryl suture. I closed the dermis with a 3-0 Vicryl and the skin with a 4-0 Monocryl. Eventually I placed benzoin, Steri-Strips, and a sterile dressing over this incision.  I then made 2.5 cmincision below her right axillary hairline. I then carried this through the axillary fascia. I then identified 2 lymph nodes that were radioactive and blue. I then identified one other lymph node that was only radioactive. Counts are all listed as above. The background counts were negligible and there was no more blue dye present. These nodes were passed off the table as sentinel nodes #1, 2, and 3. Hemostasis was obtained. I did place 2 clips on a small vessel in the axilla. I then closed the axillary fascia with 2-0 Vicryl. The dermis was closed with 3-0 Vicryl and the skin with 4-0 Monocryl. A sterile dressing was placed over this as well. A breast binder was placed. She tolerated this well was extubated and transferred to recovery stable.

## 2012-10-14 ENCOUNTER — Encounter (HOSPITAL_COMMUNITY): Payer: Self-pay | Admitting: General Surgery

## 2012-10-17 ENCOUNTER — Telehealth (INDEPENDENT_AMBULATORY_CARE_PROVIDER_SITE_OTHER): Payer: Self-pay | Admitting: General Surgery

## 2012-10-17 NOTE — Telephone Encounter (Signed)
Message copied by Littie Deeds on Mon Oct 17, 2012  2:15 PM ------      Message from: Pollard, MATTHEW      Created: Mon Oct 17, 2012  1:41 PM       She is easily confused.  Could you call her and tell her appt time in 2-3 weeks. ------

## 2012-10-17 NOTE — Telephone Encounter (Signed)
Spoke with Shawna Hawkins and informed her of her appt information w/ Dr. Dwain Sarna on 10/31/12 at 1:30.

## 2012-10-18 ENCOUNTER — Telehealth: Payer: Self-pay | Admitting: *Deleted

## 2012-10-18 NOTE — Telephone Encounter (Signed)
Called and spoke with patient and reschedule her appt. From 11/07/12 to 11/16/12 at 2pm due to onctoype results will not be ready.

## 2012-10-27 ENCOUNTER — Ambulatory Visit: Payer: Medicare Other

## 2012-10-27 ENCOUNTER — Institutional Professional Consult (permissible substitution): Payer: Medicare Other | Admitting: Radiation Oncology

## 2012-10-28 DIAGNOSIS — F332 Major depressive disorder, recurrent severe without psychotic features: Secondary | ICD-10-CM | POA: Diagnosis not present

## 2012-10-31 ENCOUNTER — Encounter (INDEPENDENT_AMBULATORY_CARE_PROVIDER_SITE_OTHER): Payer: Self-pay | Admitting: General Surgery

## 2012-10-31 ENCOUNTER — Ambulatory Visit (INDEPENDENT_AMBULATORY_CARE_PROVIDER_SITE_OTHER): Payer: Medicare Other | Admitting: General Surgery

## 2012-10-31 ENCOUNTER — Encounter: Payer: Self-pay | Admitting: *Deleted

## 2012-10-31 VITALS — BP 122/66 | HR 72 | Temp 98.2°F | Resp 14 | Ht 63.0 in | Wt 108.6 lb

## 2012-10-31 DIAGNOSIS — Z09 Encounter for follow-up examination after completed treatment for conditions other than malignant neoplasm: Secondary | ICD-10-CM

## 2012-10-31 NOTE — Progress Notes (Unsigned)
Oncotype ordered 10/31/12.  Faxed requisition to Pathology and confirmed receipt.

## 2012-11-01 ENCOUNTER — Telehealth (INDEPENDENT_AMBULATORY_CARE_PROVIDER_SITE_OTHER): Payer: Self-pay

## 2012-11-01 ENCOUNTER — Other Ambulatory Visit: Payer: Self-pay | Admitting: Emergency Medicine

## 2012-11-01 NOTE — Telephone Encounter (Signed)
Spoke to radiation oncology about them seeing the pt in about 2 wks and that Dr Michell Heinrich sent Dr Dwain Sarna a message about the appt. Dr Michell Heinrich said she would see the pt after the pt saw Dr Darnelle Catalan on 9/3 so I just wanted the scheduling dept to know so they could make an appt and call pt. They will call pt with appt time after checking with Dr Michell Heinrich.

## 2012-11-01 NOTE — Telephone Encounter (Signed)
Pt returned call and was advised Dr Luciano Cutter office should be calling her in the next few days for appt. Pt to call back if she does not hear from their office.

## 2012-11-01 NOTE — Progress Notes (Signed)
Subjective:     Patient ID: Shawna Hawkins, female   DOB: [birth year], 67 y.o.   MRN: [id number]  HPI 28 yof who underwent right lumpectomy/snbx for T1cN0 tumor that is er/pr pos, her2 negative, Ki 10%.  Her margins are clear.  She has some forgetfulness about a variety of things and we spent about 30 minutes today discussing all her treatment and the rationale behind radiation therapy as well as possible oncotype and other adjuvant therapy.  She has done well postop and really has no real complaints.  Review of Systems     Objective:   Physical Exam Right breast inframammary incision clean without infection, right axillary incision clean without infection    Assessment:     Stage I right breast cancer s/p surgery     Plan:     She can resume normal activity as tolerated. She will see rad onc and med onc for discussion about adjuvant therapy.  I discussed these with her at length today again.  She has some social difficulties that are making it difficult but she has done well with this initial step.

## 2012-11-01 NOTE — Telephone Encounter (Signed)
LMOM notifying pt that I called the Radiation oncology dept. So they could give pt an appt with Dr Michell Heinrich. I advised that the office should be calling pt in a few days with the appt date and time.

## 2012-11-04 DIAGNOSIS — C50919 Malignant neoplasm of unspecified site of unspecified female breast: Secondary | ICD-10-CM | POA: Diagnosis not present

## 2012-11-07 ENCOUNTER — Ambulatory Visit: Payer: Medicare Other | Admitting: Oncology

## 2012-11-08 ENCOUNTER — Encounter: Payer: Self-pay | Admitting: *Deleted

## 2012-11-08 ENCOUNTER — Telehealth: Payer: Self-pay | Admitting: *Deleted

## 2012-11-08 NOTE — Telephone Encounter (Signed)
Discussed f/u appts with pt.  Confirmed f/u appts with Dr. Darnelle Catalan and Dr. Michell Heinrich.  Pt discussed concerns about having radiation.  Encourage pt to discuss those concerns with Dr. Michell Heinrich.  Received verbal understanding.  Pt denies further needs at this time.  Contact information given.

## 2012-11-08 NOTE — Progress Notes (Signed)
Received Oncotype Dx results of 16. Gave copy to MD.  Emailed results to MD.  Rochele Pages results to Med Rec to scan.

## 2012-11-11 DIAGNOSIS — F332 Major depressive disorder, recurrent severe without psychotic features: Secondary | ICD-10-CM | POA: Diagnosis not present

## 2012-11-15 NOTE — Progress Notes (Signed)
Location of Breast Cancer: Right breast lower inner quadrant  Histology per Pathology Report: Breast, 09/22/12: right, needle core biopsy, 5mm irreg mass 3:30 o'clock - INVASIVE DUCTAL CARCINOMA WITH CALCIFICATIONS.  Receptor Status: ER(+), PR (+), Her2-neu (-)  Did patient present with symptoms (if so, please note symptoms) or was this found on screening mammography?:screening mammogram   Past/Anticipated interventions by surgeon, if any: 10/13/12:1. Breast, lumpectomy, Right- INVASIVE DUCTAL CARCINOMA WITH CALCIFICATIONS, GRADE I/III, SPANNING 1.7 CM. - DUCTAL CARCINOMA IN SITU, LOW GRADE.- THE SURGICAL RESECTION MARGINS ARE NEGATIVE FOR CARCINOMA.- SEE ONCOLOGY TABLE BELOW.2. Lymph node, sentinel, biopsy, Right axillary #1- THERE IS NO EVIDENCE OF CARCINOMA IN 2 OF 2 LYMPH NODES (0/2).3. Lymph node, sentinel, biopsy, Right axillary #2- THERE IS NO EVIDENCE OF CARCINOMA IN 1 OF 1 LYMPH NODE (0/1).4. Lymph node, sentinel, biopsy, Right axillary #3 - THERE IS NO EVIDENCE OF CARCINOMA IN 1 OF 1 LYMPH NODE (0/1).  Past/Anticipated interventions by medical oncology, if any: Chemotherapy, saw patient 09/28/12 multidisciplinary clinic,  Results oncotype results   Score=16, saw Dr., Darnelle Catalan 11/16/12,, anastrozole  Rx written and started 11/2012 Lymphedema issues, if any:  no   SAFETY ISSUES:  Prior radiation? no  Pacemaker/ICD? no  Possible current pregnancy?no  Is the patient on methotrexate? no  Current Complaints / other details: Seen in multidisciplinary  [facility] 7/16 , GxP3,not on HRT, mother lung ca, smoker .30ppd x 30 years,cigarettes, trying to quit, no alcohol use, chronic depression/anxiety Menarche age 67, GxP3,1st live birth age 37, unemployed, lives alone,  Shawna Hawkins, [place] 11/15/2012,2:20 PM

## 2012-11-16 ENCOUNTER — Telehealth: Payer: Self-pay | Admitting: Oncology

## 2012-11-16 ENCOUNTER — Ambulatory Visit (HOSPITAL_BASED_OUTPATIENT_CLINIC_OR_DEPARTMENT_OTHER): Payer: Medicare Other | Admitting: Oncology

## 2012-11-16 VITALS — BP 117/77 | HR 70 | Temp 97.8°F | Resp 20 | Ht 63.0 in | Wt 108.5 lb

## 2012-11-16 DIAGNOSIS — Z170 Estrogen receptor positive status [ER+]: Secondary | ICD-10-CM | POA: Diagnosis not present

## 2012-11-16 DIAGNOSIS — C50311 Malignant neoplasm of lower-inner quadrant of right female breast: Secondary | ICD-10-CM

## 2012-11-16 DIAGNOSIS — Z853 Personal history of malignant neoplasm of breast: Secondary | ICD-10-CM | POA: Insufficient documentation

## 2012-11-16 DIAGNOSIS — C50319 Malignant neoplasm of lower-inner quadrant of unspecified female breast: Secondary | ICD-10-CM

## 2012-11-16 MED ORDER — ANASTROZOLE 1 MG PO TABS: 1.0000 mg | ORAL_TABLET | Freq: Every day | ORAL | Status: DC

## 2012-11-16 NOTE — Telephone Encounter (Signed)
gv pt appt schedule for December. °

## 2012-11-16 NOTE — Progress Notes (Signed)
Patient ID: Shawna Hawkins, female   DOB: September 09, 1945, 67 y.o.   MRN: [id number] ID: Shawna Hawkins OB: 1945-10-07  MR#: [id number]  QVZ#:[id number]  PCP: Dede Query, MD GYN:   SU:  OTHER MD:   HISTORY OF PRESENT ILLNESS: "Shawna Hawkins" had screening mammography at [facility] hospital 08/25/2012 showing a possible mass in the right breast. Right diagnostic mammography at the breast Center 09/13/2012 confirmed a 7 mm spiculated mass in the right lower inner quadrant posteriorly. This mass was not palpable by exam. By ultrasound it measured 5 mm.  On 09/22/2012 the patient underwent biopsy of the mass in question, the pathology showing ([id number]) an invasive ductal carcinoma, grade 1, estrogen and progesterone receptor positive, HER-2 not amplified, with an MIB-1 of 10%.  The patient's subsequent history is as detailed below  INTERVAL HISTORY: Shawna Hawkins returns today for followup of her breast cancer. Since her last visit here she had her definitive surgery.  REVIEW OF SYSTEMS: She did well with the surgery, without unusual pain, bleeding, or fever. She sleeps poorly, and has some discomfort not only in her surgical breast but also in her legs, lower back, and shoulders. She describes these pains are from a, but very intermittent, and not more intense or persistent than before. She feels short of breath when walking stairs, but denies cough. She tells me her appetite is poor. She has rare headaches which she describes as minor. A detailed review of systems today was noncontributory  PAST MEDICAL HISTORY: Past Medical History  Diagnosis Date  . Depression   . Anxiety   . Hyperlipidemia   . COPD (chronic obstructive pulmonary disease)   . Bronchitis   . Heat exhaustion   . Anemia     as a young woman    PAST SURGICAL HISTORY: Past Surgical History  Procedure Laterality Date  . Polypectomy  2008  . Cholecystectomy    . Multiple tooth extractions    . Tonsillectomy    . Breast lumpectomy with needle  localization and axillary sentinel lymph node bx Right 10/13/2012    Procedure: BREAST LUMPECTOMY WITH NEEDLE LOCALIZATION AND AXILLARY SENTINEL LYMPH NODE BX;  Surgeon: Emelia Loron, MD;  Location: [facility] OR;  Service: General;  Laterality: Right;    FAMILY HISTORY Family History  Problem Relation Age of Onset  . Depression Mother   . Cancer Mother     lung   the patient's father died at the age of 15, the patient's mother at the age of 21. The patient has one brother, 6 sisters. The patient's mother had lung cancer diagnosed in her 38s. There is no history of breast or ringing cancer in the family to the patient's knowledge  GYNECOLOGIC HISTORY:  Menarche age 28, first live birth age 32, the patient is GX P3. She went through menopause in the late 1990s. She did not take hormone replacement.  SOCIAL HISTORY:  The patient has had various jobs in the past but is currently unemployed. Son Chanetta Marshall lives in [place] where he works as a Sports administrator. Son Thayer Ohm is currently in jail and she "does not talk with him".. Son Nedra Hai is deceased    ADVANCED DIRECTIVES: Not in place   HEALTH MAINTENANCE: History  Substance Use Topics  . Smoking status: Current Every Day Smoker -- 1.00 packs/day for 30 years    Types: Cigarettes  . Smokeless tobacco: Never Used     Comment: "trying to quit"  . Alcohol Use: No     Colonoscopy:  PAP:  Bone density: Obtained at [facility] October 2013, showing a T score of -2.4 at the left femoral neck  Lipid panel:  Allergies  Allergen Reactions  . Codeine Sulfate Nausea Only  . Hydroxyzine Hcl   . Oxycodone-Acetaminophen Nausea Only  . Oxycodone-Aspirin Nausea Only  . Penicillins     REACTION: itching    Current Outpatient Prescriptions  Medication Sig Dispense Refill  . alendronate (FOSAMAX) 70 MG tablet Take 70 mg by mouth every 7 (seven) days. Take with a full glass of water on an empty stomach.      . ALPRAZolam (XANAX) 1 MG tablet Take 1 mg by  mouth 3 (three) times daily as needed for sleep or anxiety.      Marland Kitchen atorvastatin (LIPITOR) 40 MG tablet Take 40 mg by mouth daily.      Marland Kitchen FLUoxetine (PROZAC) 20 MG capsule Take 20 mg by mouth daily.      Marland Kitchen HYDROmorphone (DILAUDID) 2 MG tablet Take 1 tablet (2 mg total) by mouth every 6 (six) hours as needed for pain.  15 tablet  0  . ketoconazole (NIZORAL) 2 % cream Apply 1 application topically daily.      Marland Kitchen loperamide (IMODIUM) 2 MG capsule Take 2 mg by mouth 4 (four) times daily as needed for diarrhea or loose stools.      Marland Kitchen omeprazole (PRILOSEC) 40 MG capsule Take 40 mg by mouth daily as needed (for acid reflux).      . promethazine (PHENERGAN) 12.5 MG tablet Take 1 tablet (12.5 mg total) by mouth every 6 (six) hours as needed for nausea.  20 tablet  0   No current facility-administered medications for this visit.    OBJECTIVE: Middle-aged white woman who appears stated age 67 Vitals:   11/16/12 1338  BP: 117/77  Pulse: 70  Temp: 97.8 F (36.6 C)  Resp: 20     Body mass index is 19.22 kg/(m^2).    ECOG FS: 1  Sclerae unicteric, pupils equal round and reactive to light Oropharynx clear No cervical or supraclavicular adenopathy Lungs no rales or rhonchi Heart regular rate and rhythm Abd soft, nontender, positive bowel sounds MSK mild kyphosis but no focal spinal tenderness, no peripheral edema Neuro: non-focal, well-oriented, appropriate affect Breasts: The right breast is status post lumpectomy. The cosmetic result is good. There is a palpable induration in the right axilla associated with the scar. There is no erythema or dehiscence. The left breast is unremarkable.   LAB RESULTS:  CMP     Component Value Date/Time   NA 138 10/11/2012 1455   NA 140 09/28/2012 0856   NA 138 02/16/2008   K 3.6 10/11/2012 1455   K 3.9 09/28/2012 0856   CL 99 10/11/2012 1455   CO2 31 10/11/2012 1455   CO2 27 09/28/2012 0856   GLUCOSE 82 10/11/2012 1455   GLUCOSE 81 09/28/2012 0856   BUN 5*  10/11/2012 1455   BUN 13.3 09/28/2012 0856   BUN 7 02/16/2008   CREATININE 0.83 10/11/2012 1455   CREATININE 1.0 09/28/2012 0856   CREATININE 0.74 11/17/2011 1618   CREATININE 0.9 02/16/2008   CALCIUM 9.8 10/11/2012 1455   CALCIUM 9.6 09/28/2012 0856   PROT 7.6 09/28/2012 0856   PROT 7.2 11/17/2011 1618   ALBUMIN 3.6 09/28/2012 0856   ALBUMIN 4.4 11/17/2011 1618   AST 17 09/28/2012 0856   AST 20 11/17/2011 1618   ALT 7 09/28/2012 0856   ALT 8 11/17/2011 1618   ALKPHOS  83 09/28/2012 0856   ALKPHOS 80 11/17/2011 1618   BILITOT 0.42 09/28/2012 0856   BILITOT 0.5 11/17/2011 1618   GFRNONAA 72* 10/11/2012 1455   GFRAA 84* 10/11/2012 1455    I No results found for this basename: SPEP,  UPEP,   kappa and lambda light chains    Lab Results  Component Value Date   WBC 7.7 10/11/2012   NEUTROABS 4.1 09/28/2012   HGB 13.4 10/11/2012   HCT 38.6 10/11/2012   MCV 92.1 10/11/2012   PLT 201 10/11/2012      Chemistry      Component Value Date/Time   NA 138 10/11/2012 1455   NA 140 09/28/2012 0856   NA 138 02/16/2008   K 3.6 10/11/2012 1455   K 3.9 09/28/2012 0856   CL 99 10/11/2012 1455   CO2 31 10/11/2012 1455   CO2 27 09/28/2012 0856   BUN 5* 10/11/2012 1455   BUN 13.3 09/28/2012 0856   BUN 7 02/16/2008   CREATININE 0.83 10/11/2012 1455   CREATININE 1.0 09/28/2012 0856   CREATININE 0.74 11/17/2011 1618   CREATININE 0.9 02/16/2008   GLU 66 02/16/2008      Component Value Date/Time   CALCIUM 9.8 10/11/2012 1455   CALCIUM 9.6 09/28/2012 0856   ALKPHOS 83 09/28/2012 0856   ALKPHOS 80 11/17/2011 1618   AST 17 09/28/2012 0856   AST 20 11/17/2011 1618   ALT 7 09/28/2012 0856   ALT 8 11/17/2011 1618   BILITOT 0.42 09/28/2012 0856   BILITOT 0.5 11/17/2011 1618       No results found for this basename: LABCA2    No components found with this basename: LABCA125    No results found for this basename: INR,  in the last 168 hours  Urinalysis No results found for this basename: colorurine,  appearanceur,  labspec,  phurine,   glucoseu,  hgbur,  bilirubinur,  ketonesur,  proteinur,  urobilinogen,  nitrite,  leukocytesur    STUDIES: No results found.  ASSESSMENT: 67 y.o. Shawna Hawkins woman status post lower inner quadrant right breast biopsy 09/22/2012 for a clinical T1a/b N0, stage IA invasive ductal carcinoma, grade 1, estrogen and progesterone receptor positive, HER-2 not amplified, with an MIB-1 of 10%.  (1) status post right lumpectomy and sentinel lymph node sampling 10/13/2012 for a PEG 1CPN0, stage IA invasive ductal carcinoma, grade 1, repeat HER-2 again nonamplified  (2) Oncotype DX score of 16 predicts a risk of distant recurrence within 10 years of 10% if the patient's only systemic treatment is tamoxifen for 5 years. This also predicts no significant benefit from chemotherapy.  (3) the patient is strongly considering not undergoing radiation  (4) anastrozole started September 2014  PLAN: I discussed Shawna Hawkins's situation with her today for upwards of 40 minutes. She understands she has a good prognosis overall. The recurrence score provided by Oncotype only refers to distant recurrence, namely not in breast recurrence. To reduce in breast recurrence (although anti-estrogens also do that) the standard of care is radiation. She is very reluctant to undergo radiation because of experiences her mother had, concerns regarding claustrophobia, and other issues. She really has an appointment with Dr. Michell Heinrich tomorrow to discuss this further.  As far sent estrogens is concerned we went over the difference between tamoxifen and anastrozole in detail, and also reviewed the possible toxicities, side effects and complications of these medications. My feeling as she likely will do better with anastrozole (she had many concerns regarding tamoxifen and cancer of  the uterus, etc.) and I went ahead and wrote her the prescription to she gained it started with her medication. If she tolerates it well she will need a repeat bone  density October of 2015.  Shawna Hawkins will see [place] again in 2 months or so to assess her tolerance of anastrozole. She knows to call for any problems that may develop before her next visit here.     Lowella Dell, MD   11/16/2012 2:07 PM

## 2012-11-17 ENCOUNTER — Encounter: Payer: Self-pay | Admitting: Radiation Oncology

## 2012-11-17 ENCOUNTER — Ambulatory Visit
Admission: RE | Admit: 2012-11-17 | Discharge: 2012-11-17 | Disposition: A | Discharge status: Home / Self Care | Payer: Medicare Other | Source: Ambulatory Visit | Attending: Radiation Oncology | Admitting: Radiation Oncology

## 2012-11-17 VITALS — BP 129/85 | HR 60 | Temp 97.9°F | Resp 20 | Ht 63.5 in | Wt 109.3 lb

## 2012-11-17 DIAGNOSIS — C50319 Malignant neoplasm of lower-inner quadrant of unspecified female breast: Secondary | ICD-10-CM | POA: Insufficient documentation

## 2012-11-17 DIAGNOSIS — Z170 Estrogen receptor positive status [ER+]: Secondary | ICD-10-CM | POA: Diagnosis not present

## 2012-11-17 DIAGNOSIS — C50311 Malignant neoplasm of lower-inner quadrant of right female breast: Secondary | ICD-10-CM

## 2012-11-17 NOTE — Progress Notes (Signed)
Please see the Nurse Progress Note in the MD Initial Consult Encounter for this patient. 

## 2012-11-17 NOTE — Progress Notes (Signed)
Department of Radiation Oncology  Phone:  [phone] Fax:        [phone]   Name: Shawna Hawkins MRN: [id number]  DOB: February 18, 1946  Date: 11/17/2012  Follow Up Visit Note  Diagnosis: T1cN0 Grade I invasive Ductal Carcinom aof the right breast  Interval History: Shawna Hawkins presents today for routine followup.  She had her surgery on July 31. She had a T1 C. lesion removed and zero out of four sentinel lymph nodes were positive. An Oncotype was sent and she has elected not to pursue chemotherapy. She met with Dr. Darnelle Catalan yesterday who prescribed tamoxifen. She is unclear where that medication is and has not started it yet. She is reluctant to pursue radiation due to her social issues. She states that if she tries radiation she is sure she will not show up for all of her treatments.  Allergies:  Allergies  Allergen Reactions  . Codeine Sulfate Nausea Only  . Hydroxyzine Hcl   . Oxycodone-Acetaminophen Nausea Only  . Oxycodone-Aspirin Nausea Only  . Penicillins     REACTION: itching    Medications:  Current Outpatient Prescriptions  Medication Sig Dispense Refill  . alendronate (FOSAMAX) 70 MG tablet Take 70 mg by mouth every 7 (seven) days. Take with a full glass of water on an empty stomach.      . ALPRAZolam (XANAX) 1 MG tablet Take 1 mg by mouth 3 (three) times daily as needed for sleep or anxiety.      Marland Kitchen anastrozole (ARIMIDEX) 1 MG tablet Take 1 tablet (1 mg total) by mouth daily.  90 tablet  12  . atorvastatin (LIPITOR) 40 MG tablet Take 40 mg by mouth daily.      Marland Kitchen FLUoxetine (PROZAC) 20 MG capsule Take 20 mg by mouth daily.      Marland Kitchen HYDROmorphone (DILAUDID) 2 MG tablet Take 1 tablet (2 mg total) by mouth every 6 (six) hours as needed for pain.  15 tablet  0  . ketoconazole (NIZORAL) 2 % cream Apply 1 application topically daily.      Marland Kitchen loperamide (IMODIUM) 2 MG capsule Take 2 mg by mouth 4 (four) times daily as needed for diarrhea or loose stools.      Marland Kitchen omeprazole (PRILOSEC)  40 MG capsule Take 40 mg by mouth daily as needed (for acid reflux).      . promethazine (PHENERGAN) 12.5 MG tablet Take 1 tablet (12.5 mg total) by mouth every 6 (six) hours as needed for nausea.  20 tablet  0   No current facility-administered medications for this encounter.    Physical Exam:  Filed Vitals:   11/17/12 1109  BP: 129/85  Pulse: 60  Temp: 97.9 F (36.6 C)  Resp: 20   she is a pleasant female who appears older than her stated age. Her scar is well-healed.  IMPRESSION: Shawna Hawkins is a 67 y.o. female that is post lumpectomy for a T1 C. N0 grade 1 ER positive PR positive HER-2 negative breast cancer  PLAN:  We had a long and frank discussion today regarding the role of radiation in decreasing local failures in patients who undergo lumpectomy. He is very reluctant to pursue radiation due to her social issues and do to her feeling that she does not need this treatment. She seems to have a good grasp of the decision that she is making an understand that my recommendations are for radiation. We discussed meet with social workers to help with her transportation needs. We discussed the results of  the elderly trials looking at mostly women over 70 with margin negative resection and the equivalence in terms of survival between antiestrogen therapy following lumpectomy versus lumpectomy plus antiestrogen therapy. In the [place] study 30% of women were younger than 68. There risk of local recurrence approach 15-20% at 10 years. We discussed that if she elected not to pursue radiation that she would have to maintain close clinical followup with mammograms and agree to be on antiestrogen therapy or her risk of recurrence could be as high as 30-40%. We discussed the recurrence could be accompanied by systemic recurrence. I highly recommended that she undergo radiation. She has elected not to pursue this however. He has regular followup with Dr. Darnelle Catalan and Dr. Dwain Sarna. I would be happy to see her  back on an as-needed basis.    Lurline Hare, MD

## 2012-11-22 ENCOUNTER — Other Ambulatory Visit: Payer: Self-pay | Admitting: Oncology

## 2012-11-24 ENCOUNTER — Encounter: Payer: Self-pay | Admitting: *Deleted

## 2012-11-24 NOTE — Progress Notes (Addendum)
[facility] Psychosocial Distress Screening Clinical Social Work  Clinical Social Work was referred by distress screening protocol.  The patient scored a 9 on the Psychosocial Distress Thermometer which indicates extreme distress. Clinical Social Worker phoned Pt to assess for distress and other psychosocial needs. Per chart review, Pt has history of anxiety and depression and this impacting on her treatment plan. Pt not home, CSW left vm and plans to follow up either phone or at follow up appt.    Clinical Social Worker follow up needed: yes plans to follow up either phone or at follow up appt.   If yes, follow up plan: CSW plans  to follow up either phone or at follow up appt.     Doreen Salvage, LCSW Clinical Social Worker [facility] Wednesday, Thursday and Friday Phone: [phone] Fax: [phone]

## 2012-12-01 ENCOUNTER — Other Ambulatory Visit: Payer: Self-pay | Admitting: *Deleted

## 2012-12-01 MED ORDER — ATORVASTATIN CALCIUM 40 MG PO TABS: 40.0000 mg | ORAL_TABLET | Freq: Every day | ORAL | Status: DC

## 2012-12-01 NOTE — Telephone Encounter (Signed)
Message sent to front office to sched pt an appt. 

## 2012-12-02 ENCOUNTER — Other Ambulatory Visit: Payer: Self-pay | Admitting: *Deleted

## 2012-12-05 MED ORDER — ALPRAZOLAM 1 MG PO TABS: 1.0000 mg | ORAL_TABLET | Freq: Three times a day (TID) | ORAL | Status: DC | PRN

## 2012-12-05 NOTE — Addendum Note (Signed)
Encounter addended by: Tessa Lerner, RN on: 12/05/2012 11:38 AM<BR>     Documentation filed: Charges VN

## 2012-12-05 NOTE — Addendum Note (Signed)
Encounter addended by: Tessa Lerner, RN on: 12/05/2012 11:34 AM<BR>     Documentation filed: Charges VN

## 2012-12-06 NOTE — Telephone Encounter (Signed)
Rx called in 

## 2012-12-16 DIAGNOSIS — F332 Major depressive disorder, recurrent severe without psychotic features: Secondary | ICD-10-CM | POA: Diagnosis not present

## 2012-12-23 DIAGNOSIS — F332 Major depressive disorder, recurrent severe without psychotic features: Secondary | ICD-10-CM | POA: Diagnosis not present

## 2012-12-26 ENCOUNTER — Other Ambulatory Visit: Payer: Self-pay | Admitting: *Deleted

## 2012-12-26 MED ORDER — FLUOXETINE HCL 20 MG PO CAPS: 20.0000 mg | ORAL_CAPSULE | Freq: Every day | ORAL | Status: DC

## 2012-12-26 NOTE — Telephone Encounter (Signed)
Pt has scheduled appointment 10/30

## 2012-12-30 DIAGNOSIS — F332 Major depressive disorder, recurrent severe without psychotic features: Secondary | ICD-10-CM | POA: Diagnosis not present

## 2013-01-12 ENCOUNTER — Encounter: Payer: Self-pay | Admitting: Internal Medicine

## 2013-01-12 ENCOUNTER — Ambulatory Visit (INDEPENDENT_AMBULATORY_CARE_PROVIDER_SITE_OTHER): Payer: Medicare Other | Admitting: Internal Medicine

## 2013-01-12 VITALS — BP 113/78 | HR 63 | Temp 97.2°F | Ht 63.0 in | Wt 104.2 lb

## 2013-01-12 DIAGNOSIS — M8580 Other specified disorders of bone density and structure, unspecified site: Secondary | ICD-10-CM

## 2013-01-12 DIAGNOSIS — M899 Disorder of bone, unspecified: Secondary | ICD-10-CM

## 2013-01-12 DIAGNOSIS — C50319 Malignant neoplasm of lower-inner quadrant of unspecified female breast: Secondary | ICD-10-CM

## 2013-01-12 DIAGNOSIS — F411 Generalized anxiety disorder: Secondary | ICD-10-CM

## 2013-01-12 DIAGNOSIS — C50311 Malignant neoplasm of lower-inner quadrant of right female breast: Secondary | ICD-10-CM

## 2013-01-12 DIAGNOSIS — M949 Disorder of cartilage, unspecified: Secondary | ICD-10-CM

## 2013-01-12 DIAGNOSIS — E785 Hyperlipidemia, unspecified: Secondary | ICD-10-CM

## 2013-01-12 MED ORDER — FLUOXETINE HCL 20 MG PO CAPS: 20.0000 mg | ORAL_CAPSULE | Freq: Every day | ORAL | Status: DC

## 2013-01-12 NOTE — Patient Instructions (Signed)
1. Follow up in 3 months 2. Please follow up with your Oncologist as scheduled 3. Please let your Oncologist know that you decides not to take Anastrozole.
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
## 2013-01-13 NOTE — Progress Notes (Signed)
Case discussed with Dr. Li soon after the resident saw the patient. We reviewed the resident's history and exam and pertinent patient test results. I agree with the assessment, diagnosis, and plan of care documented in the resident's note. 

## 2013-01-13 NOTE — Assessment & Plan Note (Signed)
Assessment: Patient is a 67 year old woman who was recently diagnosed with breast cancer  and underwent right lumpectomy/snbx for T1cN0 tumor that is er/pr pos, her2 negative, Ki 10%. She is followed by her oncologist Dr. Darnelle Catalan. She is prescribed with Anastrozole. Patient adamantly refuses to take Anastrozole and states that she does not want to take any medications or radiation for her cancer treatment. She states that she changed her diet to low carb and fruit/vege diet, and believes that dietary change can help with her cancer treatment.  Plan - discussion the importance of medical compliance with her cancer treatment and possible consequences if she is noncompliant. - encourage patient to discuss with her Oncologist about her decision. She states that she will do it during her next OV with her oncologist.

## 2013-01-13 NOTE — Assessment & Plan Note (Signed)
She is on Fosamax but refuses to take it since her breast cancer diagnosis recently. She states that she does not want to take any additional medications since her cancer diagnosis.   - education on importance of medical compliance. - patient prefers to discuss it next OV.

## 2013-01-13 NOTE — Assessment & Plan Note (Signed)
She refuses to take Statin and states that she changed her diet to low fat/low carb.

## 2013-01-13 NOTE — Assessment & Plan Note (Signed)
Patient has been on Xanax for 10+ years and tolerates it well.   - will continue Xanax PRN.

## 2013-01-19 ENCOUNTER — Encounter: Payer: Self-pay | Admitting: Internal Medicine

## 2013-01-19 ENCOUNTER — Other Ambulatory Visit: Payer: Self-pay

## 2013-01-24 ENCOUNTER — Encounter: Payer: Self-pay | Admitting: *Deleted

## 2013-01-24 NOTE — Progress Notes (Signed)
Mailed after appt letter to pt. 

## 2013-01-25 ENCOUNTER — Other Ambulatory Visit: Payer: Self-pay | Admitting: Internal Medicine

## 2013-01-25 DIAGNOSIS — Z0000 Encounter for general adult medical examination without abnormal findings: Secondary | ICD-10-CM | POA: Insufficient documentation

## 2013-02-03 DIAGNOSIS — F332 Major depressive disorder, recurrent severe without psychotic features: Secondary | ICD-10-CM | POA: Diagnosis not present

## 2013-02-06 ENCOUNTER — Ambulatory Visit (INDEPENDENT_AMBULATORY_CARE_PROVIDER_SITE_OTHER): Payer: Medicare Other | Admitting: Dietician

## 2013-02-06 VITALS — Ht 63.0 in | Wt 101.3 lb

## 2013-02-06 DIAGNOSIS — Z0000 Encounter for general adult medical examination without abnormal findings: Secondary | ICD-10-CM

## 2013-02-06 DIAGNOSIS — Z713 Dietary counseling and surveillance: Secondary | ICD-10-CM | POA: Diagnosis not present

## 2013-02-06 NOTE — Patient Instructions (Signed)
You may want to add up all of your fluid ounces in one day to see how much fluid you are taking in. ( It sounds like you are drinking well- this is just to see.)  Be sure you are getting enough protein and whole grains.  We didn't discuss activity, but this is important and may help improve your absorption of nutrients.   DASH Diet The DASH diet stands for "Dietary Approaches to Stop Hypertension." It is a healthy eating plan that has been shown to reduce high blood pressure (hypertension) in as little as 14 days, while also possibly providing other significant health benefits. These other health benefits include reducing the risk of breast cancer after menopause and reducing the risk of type 2 diabetes, heart disease, colon cancer, and stroke. Health benefits also include weight loss and slowing kidney failure in patients with chronic kidney disease.  DIET GUIDELINES  Limit salt (sodium). Your diet should contain less than 1500 mg of sodium daily.  Limit refined or processed carbohydrates. Your diet should include mostly whole grains. Desserts and added sugars should be used sparingly.  Include small amounts of heart-healthy fats. These types of fats include nuts, oils, and tub margarine. Limit saturated and trans fats. These fats have been shown to be harmful in the body. CHOOSING FOODS  The following food groups are based on a 2000 calorie diet. See your Registered Dietitian for individual calorie needs. Grains and Grain Products (6 to 8 servings daily)  Eat More Often: Whole-wheat bread, brown rice, whole-grain or wheat pasta, quinoa, popcorn without added fat or salt (air popped).  Eat Less Often: White bread, white pasta, white rice, cornbread. Vegetables (4 to 5 servings daily)  Eat More Often: Fresh, frozen, and canned vegetables. Vegetables may be raw, steamed, roasted, or grilled with a minimal amount of fat.  Eat Less Often/Avoid: Creamed or fried vegetables. Vegetables in a  cheese sauce. Fruit (4 to 5 servings daily)  Eat More Often: All fresh, canned (in natural juice), or frozen fruits. Dried fruits without added sugar. One hundred percent fruit juice ( cup [237 mL] daily).  Eat Less Often: Dried fruits with added sugar. Canned fruit in light or heavy syrup. [employer], Fish, and Poultry (2 servings or less daily. One serving is 3 to 4 oz [85-114 g]).  Eat More Often: Ninety percent or leaner ground beef, tenderloin, sirloin. Round cuts of beef, chicken breast, [place] breast. All fish. Grill, bake, or broil your meat. Nothing should be fried.  Eat Less Often/Avoid: Fatty cuts of meat, [place], or chicken leg, thigh, or wing. Fried cuts of meat or fish. Dairy (2 to 3 servings)  Eat More Often: Low-fat or fat-free milk, low-fat plain or light yogurt, reduced-fat or part-skim cheese.  Eat Less Often/Avoid: Milk (whole, 2%).Whole milk yogurt. Full-fat cheeses. Nuts, Seeds, and Legumes (4 to 5 servings per week)  Eat More Often: All without added salt.  Eat Less Often/Avoid: Salted nuts and seeds, canned beans with added salt. Fats and Sweets (limited)  Eat More Often: Vegetable oils, tub margarines without trans fats, sugar-free gelatin. Mayonnaise and salad dressings.  Eat Less Often/Avoid: Coconut oils, palm oils, butter, stick margarine, cream, half and half, cookies, candy, pie. FOR MORE INFORMATION The Dash Diet Eating Plan: www.dashdiet.org

## 2013-02-06 NOTE — Progress Notes (Signed)
Medical Nutrition Therapy:  Appt start time: 1336 end time:  1425.  Assessment:  Primary concerns today: Meal planning. - anti anti-inflammatory/anti-cancer diet  Weight decreased 3# and is low normal limits. Stools improved since on current diet, but still has hard stools periodically  Supplements: stopped vit C and magnesium, ginger root, B complex,  And cannot recall the other 3 Usual eating pattern includes 3-6 small meals/snacks per day. Frequent foods include fresh fruits-bananas, vegetables- kale, nuts, beans.  Avoided foods include dairy,soy,red meat,eggs, canned foods, bread..   24-hr recall:  (Up at  AM)noon, goes to bed midnight or later ( 11 pm- 3-4 am, naps during day 3-4 days/week B (12-1 PM)-    1/2 lemon squeezed wit hot water and 1-2 cups green tea without sugar Snk (1-3 PM)-  Blends fruits, 5-7 types of veggies and spices and drinks this throughout the day    Snk - 1 TBSP Black strap molasses and 1 tsp baking soda in hot water once daily D ( PM)- baked chicken 2 days a week and salmon one day a week  Snk ( PM)- Shake made earlier in day  Progress Towards Goal(s):  In progress.   Nutritional Diagnosis:  NB-1.1 Food and nutrition-related knowledge deficit As related to balanced and nutritious diet in the context of her dietary desires for an anti cancer diet.  As evidenced by her report and questions about wanting input about adequacy of her current diet intake.    Intervention:  Nutrition education about balanced diet including adequate protein, fats and whole grains Support of her current quest for a balanced, adequate and healthy diet. Analyze food record, address physical activity next visit or via Mychart per patient request  Monitoring/Evaluation:  Dietary intake, exercise, and body weight prn.

## 2013-02-16 ENCOUNTER — Ambulatory Visit: Payer: Medicare Other | Admitting: Physician Assistant

## 2013-02-17 ENCOUNTER — Other Ambulatory Visit: Payer: Self-pay | Admitting: *Deleted

## 2013-02-17 ENCOUNTER — Other Ambulatory Visit: Payer: Self-pay | Admitting: Internal Medicine

## 2013-02-17 MED ORDER — FLUOXETINE HCL 20 MG PO CAPS: 20.0000 mg | ORAL_CAPSULE | Freq: Every day | ORAL | Status: DC

## 2013-02-17 MED ORDER — ALPRAZOLAM 1 MG PO TABS: 1.0000 mg | ORAL_TABLET | Freq: Three times a day (TID) | ORAL | Status: DC | PRN

## 2013-02-17 NOTE — Telephone Encounter (Signed)
Also pt would like 90 day supplies of all but alprazolam, she specifically stated the prozac

## 2013-02-21 NOTE — Telephone Encounter (Signed)
Called to pharmacy 

## 2013-03-17 DIAGNOSIS — F332 Major depressive disorder, recurrent severe without psychotic features: Secondary | ICD-10-CM | POA: Diagnosis not present

## 2013-03-17 DIAGNOSIS — F4310 Post-traumatic stress disorder, unspecified: Secondary | ICD-10-CM | POA: Diagnosis not present

## 2013-03-28 ENCOUNTER — Other Ambulatory Visit: Payer: Self-pay | Admitting: *Deleted

## 2013-03-29 MED ORDER — ALPRAZOLAM 1 MG PO TABS: 1.0000 mg | ORAL_TABLET | Freq: Three times a day (TID) | ORAL | Status: DC | PRN

## 2013-03-29 NOTE — Telephone Encounter (Signed)
Rx called into pharmacy and pt is aware. 

## 2013-03-30 DIAGNOSIS — H2510 Age-related nuclear cataract, unspecified eye: Secondary | ICD-10-CM | POA: Diagnosis not present

## 2013-03-31 DIAGNOSIS — F332 Major depressive disorder, recurrent severe without psychotic features: Secondary | ICD-10-CM | POA: Diagnosis not present

## 2013-03-31 DIAGNOSIS — F4310 Post-traumatic stress disorder, unspecified: Secondary | ICD-10-CM | POA: Diagnosis not present

## 2013-04-07 DIAGNOSIS — F332 Major depressive disorder, recurrent severe without psychotic features: Secondary | ICD-10-CM | POA: Diagnosis not present

## 2013-04-07 DIAGNOSIS — F4310 Post-traumatic stress disorder, unspecified: Secondary | ICD-10-CM | POA: Diagnosis not present

## 2013-04-14 ENCOUNTER — Other Ambulatory Visit: Payer: Self-pay | Admitting: *Deleted

## 2013-04-14 MED ORDER — ATORVASTATIN CALCIUM 40 MG PO TABS: 40.0000 mg | ORAL_TABLET | Freq: Every day | ORAL | Status: DC

## 2013-04-14 NOTE — Telephone Encounter (Signed)
Called pt aagin - she stated she will start taking the Lipitor; Dr Nicoletta Dress informed.

## 2013-04-14 NOTE — Telephone Encounter (Signed)
Pt was called ; no answer - message left.

## 2013-04-15 NOTE — Telephone Encounter (Signed)
Great! I sent her refills to her pharmacy.   Thanks  Greydis Stlouis.

## 2013-04-21 DIAGNOSIS — F332 Major depressive disorder, recurrent severe without psychotic features: Secondary | ICD-10-CM | POA: Diagnosis not present

## 2013-05-05 DIAGNOSIS — F332 Major depressive disorder, recurrent severe without psychotic features: Secondary | ICD-10-CM | POA: Diagnosis not present

## 2013-05-10 ENCOUNTER — Other Ambulatory Visit: Payer: Self-pay | Admitting: Internal Medicine

## 2013-05-10 NOTE — Telephone Encounter (Signed)
Xanax called to [facility].

## 2013-05-11 ENCOUNTER — Encounter: Payer: Self-pay | Admitting: Internal Medicine

## 2013-05-19 DIAGNOSIS — F332 Major depressive disorder, recurrent severe without psychotic features: Secondary | ICD-10-CM | POA: Diagnosis not present

## 2013-06-06 ENCOUNTER — Ambulatory Visit (INDEPENDENT_AMBULATORY_CARE_PROVIDER_SITE_OTHER): Payer: Medicare Other | Admitting: Internal Medicine

## 2013-06-06 ENCOUNTER — Encounter: Payer: Self-pay | Admitting: Internal Medicine

## 2013-06-06 VITALS — BP 102/74 | HR 70 | Temp 96.5°F | Ht 63.5 in | Wt 98.2 lb

## 2013-06-06 DIAGNOSIS — C50319 Malignant neoplasm of lower-inner quadrant of unspecified female breast: Secondary | ICD-10-CM | POA: Diagnosis not present

## 2013-06-06 DIAGNOSIS — Z0000 Encounter for general adult medical examination without abnormal findings: Secondary | ICD-10-CM | POA: Diagnosis not present

## 2013-06-06 DIAGNOSIS — F411 Generalized anxiety disorder: Secondary | ICD-10-CM | POA: Diagnosis not present

## 2013-06-06 DIAGNOSIS — E785 Hyperlipidemia, unspecified: Secondary | ICD-10-CM | POA: Diagnosis not present

## 2013-06-06 DIAGNOSIS — M949 Disorder of cartilage, unspecified: Secondary | ICD-10-CM | POA: Diagnosis not present

## 2013-06-06 DIAGNOSIS — C50311 Malignant neoplasm of lower-inner quadrant of right female breast: Secondary | ICD-10-CM

## 2013-06-06 DIAGNOSIS — M899 Disorder of bone, unspecified: Secondary | ICD-10-CM | POA: Diagnosis not present

## 2013-06-06 MED ORDER — ALPRAZOLAM 1 MG PO TABS: ORAL_TABLET | ORAL | Status: DC

## 2013-06-06 NOTE — Progress Notes (Signed)
Generic Xanax called in to pharmacy per Dr Doug Sou. Hilda Blades Reyann Troop RN 06/06/13 3:20PM

## 2013-06-06 NOTE — Patient Instructions (Addendum)
We have refilled your xanax, we would like you to keep taking the prozac. If possible work on taking the xanax only 2 times per day. Try to breathe through small panic attacks and use the xanax only for large panic attacks.  You should go see the cancer doctor soon if you do not have an appointment call them.   Please bring your medicines with you each time you come.   Medicines may be  Eye drops  Herbal   Vitamins  Pills  Seeing these help [place] take care of you.  Call us with questions or problems and return in about 6 months for a visit. Our number is [phone].

## 2013-06-06 NOTE — Assessment & Plan Note (Signed)
Patient declined all vaccinations, screening including colonoscopy.

## 2013-06-06 NOTE — Progress Notes (Signed)
Subjective:     Patient ID: Shawna Hawkins, female   DOB: 18-Nov-1945, 68 y.o.   MRN: [id number]  HPI The patient is a 68 YO female with PMH of breast cancer, asthma, anxiety who is here for acute visit. She has not been seen in awhile and at last visit was intent on using diet to heal her cancer and had stopped medications. She is currently taking her statin, arimidex, prozac, and xanax. She was supposed to come last month but was canceled due to weather. She is concerned about avoiding sugar in her daily life and has lost about 5# since last visit. She is not having any problems right now and feels that the prozac is working well for her and she is having less of her severe panic attacks and some milder panic attacks. She would not like any screening or vaccinations today.    Review of Systems  Constitutional: Negative for fever, chills, diaphoresis, activity change, appetite change, fatigue and unexpected weight change.  Respiratory: Negative for cough, chest tightness, shortness of breath and wheezing.   Cardiovascular: Negative for chest pain, palpitations and leg swelling.  Gastrointestinal: Negative for nausea, vomiting, abdominal pain, diarrhea and constipation.  Neurological: Negative for dizziness, tremors, seizures, syncope, facial asymmetry, speech difficulty, weakness, light-headedness, numbness and headaches.       Objective:   Physical Exam  Constitutional: She is oriented to person, place, and time. She appears well-developed.  Very thin  HENT:  Head: Normocephalic and atraumatic.  Right Ear: External ear normal.  Left Ear: External ear normal.  Left ear with moderate wax, not able to visualize TM. Right ear with normal TM and mild wax.   Eyes: EOM are normal. Pupils are equal, round, and reactive to light.  Neck: Normal range of motion. Neck supple.  Cardiovascular: Normal rate and regular rhythm.   Pulmonary/Chest: Effort normal and breath sounds normal. No respiratory  distress. She has no wheezes. She has no rales.  Abdominal: Soft. Bowel sounds are normal. She exhibits no distension. There is no tenderness. There is no rebound.  Musculoskeletal: Normal range of motion.  Neurological: She is alert and oriented to person, place, and time. No cranial nerve deficit.  Skin: Skin is warm and dry.  Psychiatric: Her behavior is normal.       Assessment/Plan:   1. Please see problem oriented charting.   2. Disposition - Patient will return in 6 months with PCP. Advised to follow up with Dr. Jana Hakim. Advised not to avoid sugar and that she should try to gain back some weight as her body cannot fight off cancer and infection well when losing weight. She declines all screening measures at today's visit.

## 2013-06-06 NOTE — Assessment & Plan Note (Signed)
Patient did not undergo radiation and is currently taking arimidex which I encouraged. I also encouraged her to go see her oncologist. She will think about it but is not enthusiastic.

## 2013-06-06 NOTE — Assessment & Plan Note (Signed)
Advised patient to utilize deep breathing techniques to help with minor panic attacks and to try to reduce xanax to bid if able while taking the prozac. Refill for xanax given for #90 refills 2.

## 2013-06-08 NOTE — Progress Notes (Signed)
Case discussed with Dr. Doug Sou soon after the resident saw the patient.  We reviewed the resident's history and exam and pertinent patient test results.  I agree with the assessment, diagnosis and plan of care documented in the resident's note.

## 2013-07-14 DIAGNOSIS — F332 Major depressive disorder, recurrent severe without psychotic features: Secondary | ICD-10-CM | POA: Diagnosis not present

## 2013-07-21 DIAGNOSIS — F332 Major depressive disorder, recurrent severe without psychotic features: Secondary | ICD-10-CM | POA: Diagnosis not present

## 2013-08-04 DIAGNOSIS — F332 Major depressive disorder, recurrent severe without psychotic features: Secondary | ICD-10-CM | POA: Diagnosis not present

## 2013-08-18 DIAGNOSIS — F332 Major depressive disorder, recurrent severe without psychotic features: Secondary | ICD-10-CM | POA: Diagnosis not present

## 2013-09-07 ENCOUNTER — Other Ambulatory Visit: Payer: Self-pay | Admitting: Internal Medicine

## 2013-09-07 NOTE — Telephone Encounter (Signed)
Refill approved - nurse to call in. 

## 2013-09-08 DIAGNOSIS — F332 Major depressive disorder, recurrent severe without psychotic features: Secondary | ICD-10-CM | POA: Diagnosis not present

## 2013-09-08 NOTE — Telephone Encounter (Signed)
Rx called in to pharmacy. 

## 2013-09-12 ENCOUNTER — Telehealth: Payer: Self-pay | Admitting: *Deleted

## 2013-09-12 MED ORDER — OMEPRAZOLE 40 MG PO CPDR: 40.0000 mg | DELAYED_RELEASE_CAPSULE | Freq: Every day | ORAL | Status: DC

## 2013-09-12 NOTE — Telephone Encounter (Signed)
[facility] is requesting refill on Omeprazole 40mg  #90/  Last refill 09/11/12. On past med hx.Hilda Blades Ditzler RN 09/12/13 10AM

## 2013-09-20 DIAGNOSIS — F4310 Post-traumatic stress disorder, unspecified: Secondary | ICD-10-CM | POA: Diagnosis not present

## 2013-09-20 DIAGNOSIS — F332 Major depressive disorder, recurrent severe without psychotic features: Secondary | ICD-10-CM | POA: Diagnosis not present

## 2013-10-04 DIAGNOSIS — F4310 Post-traumatic stress disorder, unspecified: Secondary | ICD-10-CM | POA: Diagnosis not present

## 2013-10-04 DIAGNOSIS — F332 Major depressive disorder, recurrent severe without psychotic features: Secondary | ICD-10-CM | POA: Diagnosis not present

## 2013-10-07 ENCOUNTER — Other Ambulatory Visit: Payer: Self-pay | Admitting: Internal Medicine

## 2013-10-09 NOTE — Telephone Encounter (Signed)
Rx called in to pharmacy. 

## 2013-10-11 DIAGNOSIS — F4310 Post-traumatic stress disorder, unspecified: Secondary | ICD-10-CM | POA: Diagnosis not present

## 2013-10-11 DIAGNOSIS — F332 Major depressive disorder, recurrent severe without psychotic features: Secondary | ICD-10-CM | POA: Diagnosis not present

## 2013-10-25 DIAGNOSIS — F332 Major depressive disorder, recurrent severe without psychotic features: Secondary | ICD-10-CM | POA: Diagnosis not present

## 2013-10-25 DIAGNOSIS — F411 Generalized anxiety disorder: Secondary | ICD-10-CM | POA: Diagnosis not present

## 2013-11-14 ENCOUNTER — Other Ambulatory Visit: Payer: Self-pay | Admitting: *Deleted

## 2013-11-14 NOTE — Telephone Encounter (Signed)
Patient is due to see Dr. Hayes Ludwig on 9/3, will defer refill until that time as she should be titrated down off of her Xanax and I have never seen the patient.  Will send Dr. Hayes Ludwig a note.  If patient no shows to appointment, she will be given a decreased dose prescription later this wee.

## 2013-11-16 ENCOUNTER — Encounter: Payer: Self-pay | Admitting: Internal Medicine

## 2013-11-16 ENCOUNTER — Ambulatory Visit (INDEPENDENT_AMBULATORY_CARE_PROVIDER_SITE_OTHER): Payer: Medicare Other | Admitting: Internal Medicine

## 2013-11-16 ENCOUNTER — Other Ambulatory Visit: Payer: Self-pay | Admitting: *Deleted

## 2013-11-16 VITALS — BP 120/70 | HR 76 | Temp 97.8°F | Ht 63.5 in | Wt 105.7 lb

## 2013-11-16 DIAGNOSIS — G4700 Insomnia, unspecified: Secondary | ICD-10-CM | POA: Diagnosis not present

## 2013-11-16 DIAGNOSIS — Z0000 Encounter for general adult medical examination without abnormal findings: Secondary | ICD-10-CM

## 2013-11-16 DIAGNOSIS — M79641 Pain in right hand: Secondary | ICD-10-CM

## 2013-11-16 DIAGNOSIS — F3289 Other specified depressive episodes: Secondary | ICD-10-CM

## 2013-11-16 DIAGNOSIS — C50319 Malignant neoplasm of lower-inner quadrant of unspecified female breast: Secondary | ICD-10-CM | POA: Diagnosis not present

## 2013-11-16 DIAGNOSIS — F329 Major depressive disorder, single episode, unspecified: Secondary | ICD-10-CM | POA: Diagnosis not present

## 2013-11-16 DIAGNOSIS — M899 Disorder of bone, unspecified: Secondary | ICD-10-CM | POA: Diagnosis not present

## 2013-11-16 DIAGNOSIS — F411 Generalized anxiety disorder: Secondary | ICD-10-CM

## 2013-11-16 DIAGNOSIS — M79609 Pain in unspecified limb: Secondary | ICD-10-CM

## 2013-11-16 DIAGNOSIS — E785 Hyperlipidemia, unspecified: Secondary | ICD-10-CM | POA: Diagnosis not present

## 2013-11-16 NOTE — Patient Instructions (Signed)
-  Start taking 0.5mg  of Xanax (half a tablet) twice per day as needed for anxiety and continue taking 1mg  (one tablet) of Xanax at night.  -Good sleep hygiene will improve your sleep. Please refer to the list below for ideas on how to improve your sleep.  -Please let us know if you want to see a psychiatrist to help you with your medications.  -Make an appointment to see Dr. Daryll Drown within one month, before you run out of your Xanax.  - Things you can do to promote good sleep (From the American Sleep Association)  Maintain a regular sleep routine  Go to bed at the same time. Wake up at the same time. Ideally, your schedule will remain the same (+/- 20 minutes) every night of the week. Avoid naps if possible  Naps decrease the 'Sleep Debt' that is so necessary for easy sleep onset. Each of [place] needs a certain amount of sleep per 24-hour period. We need that amount, and we don't need more than that. When we take naps, it decreases the amount of sleep that we need the next night - which may cause sleep fragmentation and diffulty initiating sleep, and may lead to insomnia. Don't stay in bed awake for more than 5-10 minutes.  If you find your mind racing, or worrying about not being able to sleep during the middle of the night, get out of bed, and sit in a chair in the dark. Do your mind racing in the chair until you are sleepy, then return to bed. No TV or internet during these periods! That will just stimulate you more than desired. If this happens several times during the night, that is OK. Just maintain your regular wake time, and try to avoid naps. Don't watch TV or read in bed.  When you watch TV or read in bed, you associate the bed with wakefulness. The bed is reserved for two things - sleep and hanky panky. Do not drink caffeine inappropriately  The effects of caffeine may last for several hours after ingestion. Caffeine can fragment sleep, and cause difficulty initiating sleep. If you  drink caffeine, use it only before noon. Remember that soda and tea contain caffeine as well. Avoid inappropriate substances that interfere with sleep  Cigarettes, alcohol, and over-the-counter medications may cause fragmented sleep. Exercise regularly  Exercise before 2 pm every day. Exercise promotes continuous sleep. Avoid rigorous exercise before bedtime. Rigorous exercise circulates endorphins into the body which may cause difficulty initiating sleep. Have a quiet, comfortable bedroom  Set your bedroom thermostat at a comfortable temperature. Generally, a little cooler is better than a little warmer. Turn off the TV and other extraneous noise that may disrupt sleep. Background 'white noise' like a fan is OK. If your pets awaken you, keep them outside the bedroom. Your bedroom should be dark. Turn off bright lights. Have a comfortable mattress. If you are a 'clock watcher' at night, hide the clock.  Have a comfortable pre-bedtime routine  A warm bath, shower Meditation, or quiet time    Reviewed September, 2007

## 2013-11-17 MED ORDER — ALPRAZOLAM 1 MG PO TABS: 1.0000 mg | ORAL_TABLET | Freq: Three times a day (TID) | ORAL | Status: DC | PRN

## 2013-11-17 NOTE — Telephone Encounter (Signed)
Rx called in to pharmacy - pt aware. 

## 2013-11-17 NOTE — Telephone Encounter (Signed)
Needs phone in
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
## 2013-11-18 DIAGNOSIS — G4700 Insomnia, unspecified: Secondary | ICD-10-CM | POA: Insufficient documentation

## 2013-11-18 NOTE — Assessment & Plan Note (Signed)
Declines vaccination and colonoscopy at this time.
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
## 2013-11-18 NOTE — Assessment & Plan Note (Signed)
She takes Xanax 1mg  at night to help with sleep but reports only 3-4 hours per night of sleep and many days of not feeling tired during the day with no need to nap.  She would benefit from formal psychiatric evaluation but declines referral at this time.
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
## 2013-11-21 ENCOUNTER — Ambulatory Visit (INDEPENDENT_AMBULATORY_CARE_PROVIDER_SITE_OTHER): Payer: Medicare Other | Admitting: General Surgery

## 2013-11-22 DIAGNOSIS — F332 Major depressive disorder, recurrent severe without psychotic features: Secondary | ICD-10-CM | POA: Diagnosis not present

## 2013-11-22 NOTE — Progress Notes (Signed)
Internal Medicine Clinic Attending Date of visit: 11/16/2013   Case discussed with Dr. Hayes Ludwig soon after the resident saw the patient.  We reviewed the resident's history and exam and pertinent patient test results.  I agree with the assessment, diagnosis, and plan of care documented in the resident's note.

## 2013-12-05 ENCOUNTER — Encounter: Payer: Self-pay | Admitting: Internal Medicine

## 2013-12-05 ENCOUNTER — Ambulatory Visit (INDEPENDENT_AMBULATORY_CARE_PROVIDER_SITE_OTHER): Payer: Medicare Other | Admitting: Internal Medicine

## 2013-12-05 VITALS — BP 141/77 | HR 71 | Temp 97.9°F | Ht 64.2 in | Wt 104.4 lb

## 2013-12-05 DIAGNOSIS — E785 Hyperlipidemia, unspecified: Secondary | ICD-10-CM | POA: Diagnosis not present

## 2013-12-05 DIAGNOSIS — F329 Major depressive disorder, single episode, unspecified: Secondary | ICD-10-CM

## 2013-12-05 DIAGNOSIS — F411 Generalized anxiety disorder: Secondary | ICD-10-CM | POA: Diagnosis not present

## 2013-12-05 DIAGNOSIS — F3289 Other specified depressive episodes: Secondary | ICD-10-CM

## 2013-12-05 DIAGNOSIS — C50319 Malignant neoplasm of lower-inner quadrant of unspecified female breast: Secondary | ICD-10-CM | POA: Diagnosis not present

## 2013-12-05 DIAGNOSIS — Z0000 Encounter for general adult medical examination without abnormal findings: Secondary | ICD-10-CM | POA: Diagnosis not present

## 2013-12-05 DIAGNOSIS — M899 Disorder of bone, unspecified: Secondary | ICD-10-CM | POA: Diagnosis not present

## 2013-12-05 NOTE — Patient Instructions (Signed)
-  You may have bipolar disorder but you need to see a psychiatrist for further evaluation.  -You have been referred to our social worker, Golden Hurter. She will call you with options for you to see a psychiattrist.  -Follow up with [place] in 1 month if needed for anxiety but you will likely be seen by a psychiatrist before then who may help you with your anxiety medications.  -You will need a routing physical exam within 3 months. You may make that appointment now.  -Have a wonderful week!   Please bring your medicines with you each time you come.   Medicines may be  Eye drops  Herbal   Vitamins  Pills  Seeing these help [place] take care of you.

## 2013-12-06 NOTE — Assessment & Plan Note (Signed)
She reports improvement of her panic attacks with tx with Prozac. Will continue this tx for now but she does need  evaluation for bipolar disorder.  Continue Xanax 1mg  TID prn for now.  -Referral to psychiatry--referred to Golden Hurter for further assistance on [facility] referral.

## 2013-12-06 NOTE — Assessment & Plan Note (Addendum)
Denies SI/HI. She is concerned she may have bipolar disorder but has never seen a psychiatrist. She denies manic episodes but she is a poor historian with circumferential speech and it is difficult to pinpoint specific manic episodes. She will continue counseling. Continue Prozac for now.
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
## 2013-12-08 ENCOUNTER — Telehealth: Payer: Self-pay | Admitting: Licensed Clinical Social Worker

## 2013-12-08 ENCOUNTER — Other Ambulatory Visit: Payer: Self-pay | Admitting: *Deleted

## 2013-12-08 MED ORDER — ATORVASTATIN CALCIUM 40 MG PO TABS
40.0000 mg | ORAL_TABLET | Freq: Every day | ORAL | Status: DC
Start: 2013-12-08 — End: 2014-03-12

## 2013-12-08 NOTE — Telephone Encounter (Signed)
CSW placed call to Ms. Lafuente to discuss referral to psychiatry.  Pt is current at [facility] and was unaware [facility] offers psychiatry.  CSW inquired if Ms. Pantaleo would like CSW to schedule an appointment with psychiatry at her current agency.  Ms. Duchesneau states she would like to talk with her therapist first, she has an upcoming appointment on Wednesday.  CSW encouraged Ms. Ramsburg to discuss physician concerns with therapist and inquired if pt would be willing to sign a ROI.  Pt not wanting to sign an open ROI, CSW informed pt of the specific items that can be marked for release and CSW will send in mail to patient.  Pt can decide if she wants to sign or if she does not.   Ms. Mcevers "This is a little scary."  "It's all over this one medication." "The alprazolam or Xanax. They don't understand where I live."  Ms. Decelles states she did not have problems sleeping until she moved to her current area.  Pt states there is constant noise all day and night.  And, as she has gotten older it is more difficult to ignore.  CSW discussed that psychiatry is a specialty and our physicians would like to have a specialist assess to make sure she is receiving the most appropriate medications based on her diagnoses and the specialty of a psychiatrist.  Ms. Revak will discuss psychiatric services with her therapist at [facility].  Should pt need referral information from [facility], pt has CSW contact information to provide to therapist.  CSW will continue to follow.  ROI  Mailed.

## 2013-12-08 NOTE — Progress Notes (Signed)
INTERNAL MEDICINE TEACHING ATTENDING ADDENDUM - Bryley Chrisman, MD: I reviewed and discussed at the time of visit with the resident Dr. Kennerly, the patient's medical history, physical examination, diagnosis and results of pertinent tests and treatment and I agree with the patient's care as documented.  

## 2013-12-18 ENCOUNTER — Other Ambulatory Visit: Payer: Self-pay | Admitting: Internal Medicine

## 2013-12-18 MED ORDER — ALPRAZOLAM 1 MG PO TABS: 1.0000 mg | ORAL_TABLET | Freq: Three times a day (TID) | ORAL | Status: DC | PRN

## 2013-12-18 NOTE — Telephone Encounter (Signed)
Rx called in to pharmacy and pt aware.

## 2013-12-19 NOTE — Telephone Encounter (Signed)
CSW placed called to pt to confirm if pt was able to speak with or see a psychiatrist at [facility].  CSW left message requesting return call. CSW provided contact hours and phone number.

## 2013-12-21 NOTE — Telephone Encounter (Signed)
CSW placed call to Ms. Sandquist, pt states she can not talk long because she has to care for her grandchildren.  Pt states she has not been able to meet with her therapist yet to discuss psychiatry services, but has an appointment next week.  Ms. Ordaz states she received CSW letter and inquired what to do with it, encouraged pt to discuss with her therapist regarding [facility].  Pt states "this is a waste of time, I asked them and they said no".  CSW requested pt to elaborate.  Pt states "I asked them if I was [facility] and they said "no" ."  Pt states she will see psychiatrist and will get back with CSW.  CSW will sign off at this time.  Pt has information available and is connected with [employer].

## 2013-12-28 ENCOUNTER — Encounter: Payer: Self-pay | Admitting: *Deleted

## 2014-01-03 DIAGNOSIS — F331 Major depressive disorder, recurrent, moderate: Secondary | ICD-10-CM | POA: Diagnosis not present

## 2014-01-08 ENCOUNTER — Other Ambulatory Visit: Payer: Self-pay | Admitting: Oncology

## 2014-01-12 DIAGNOSIS — F331 Major depressive disorder, recurrent, moderate: Secondary | ICD-10-CM | POA: Diagnosis not present

## 2014-01-15 ENCOUNTER — Other Ambulatory Visit: Payer: Self-pay | Admitting: Oncology

## 2014-01-15 ENCOUNTER — Other Ambulatory Visit: Payer: Self-pay | Admitting: *Deleted

## 2014-01-16 ENCOUNTER — Other Ambulatory Visit: Payer: Self-pay | Admitting: Internal Medicine

## 2014-01-16 ENCOUNTER — Telehealth: Payer: Self-pay | Admitting: *Deleted

## 2014-01-16 ENCOUNTER — Encounter (INDEPENDENT_AMBULATORY_CARE_PROVIDER_SITE_OTHER): Payer: Self-pay | Admitting: General Surgery

## 2014-01-16 ENCOUNTER — Telehealth: Payer: Self-pay | Admitting: Oncology

## 2014-01-16 NOTE — Telephone Encounter (Signed)
Spoke with pt and she did not want to sch a appt for the next available.

## 2014-01-16 NOTE — Telephone Encounter (Signed)
Called patient.  Message left requesting return call.  Awaiting return call from patient.  Yesterday a P.O.F. Sent for f/u appt.  Per scheduler she refused to schedule.  Wouldlike to [employer] if she is unable to schedule a f/u or if she is transferring care to another provider.

## 2014-01-17 ENCOUNTER — Encounter: Payer: Self-pay | Admitting: *Deleted

## 2014-01-17 ENCOUNTER — Other Ambulatory Visit: Payer: Self-pay | Admitting: *Deleted

## 2014-01-17 MED ORDER — ALPRAZOLAM 1 MG PO TABS: 1.0000 mg | ORAL_TABLET | Freq: Three times a day (TID) | ORAL | Status: DC | PRN

## 2014-01-17 NOTE — Telephone Encounter (Signed)
Spoke w/ charsetta, 1st appt first week of jan, 2nd appt will be first week of feb

## 2014-01-17 NOTE — Telephone Encounter (Signed)
Called to pharm 

## 2014-01-17 NOTE — Telephone Encounter (Signed)
Shawna Hawkins - would you pls contact mental health ans see where she is on pysch appt?  Has been on Xanax TID for 11 yrs per records since son died. She was started on SSRI and stated better on it but she never tapered the Xanax. The needs addressed by PCP and not a different [facility] MD each visit. Dr Sandi Raveling first aptp is Jan. Therefore, will refill until Jan and sch appt early Jan and late Jan with PCP.   Bonnita Nasuti - pls let her know she needs to keep Jan appt with Mallory - at least the first appt. Dr Sherrine Maples can decide after the 1st appt whether to keep 2nd Jan appt or cancel it.

## 2014-01-19 ENCOUNTER — Encounter: Payer: Self-pay | Admitting: Internal Medicine

## 2014-01-19 ENCOUNTER — Telehealth: Payer: Self-pay | Admitting: Licensed Clinical Social Worker

## 2014-01-19 NOTE — Telephone Encounter (Signed)
Pt returned call to CSW and left message stating: pt has discussed psychiatrist need with counselor and was told he would put in the referral.  Pt states she did not hear back and call [facility] herself.  Pt states she was told that was a different assessment and would be scheduled.  Pt states she feels as though she is getting incorrect/different information.  This worker has faxed referral request to [place] and will f/u on Monday, next business day.

## 2014-01-19 NOTE — Telephone Encounter (Signed)
CSW sent email requesting fax number for [facility] records.  CSW received signed ROI from Ms. Hilliker.

## 2014-01-19 NOTE — Telephone Encounter (Signed)
CSW placed call and left message notifying [facility], [place] will fax ROI and referral for pt to be seen by psychiatry.  Documentation faxed.

## 2014-01-22 NOTE — Telephone Encounter (Signed)
CSW received message from Curley Spice, Ms. Brigham CSW at [facility].  Mr. Berneice Heinrich states he has put in referral for Ms. Jeffreys to be seen by Psychiatric NP at [facility] but currently booked until Dec - Jan 2016 and may be best if pt seen quicker at [facility] or [facility] as walk-in (he did not recommend walk-in at this time for Ms. Weatherspoon).  CSW returned call to Mr. Shore, left message at their main line indicating [facility] is booked until the beginning of the year.  Pt is familiar with [facility] and to please move forward with referral to Psychiatric NP.

## 2014-01-22 NOTE — Telephone Encounter (Signed)
[facility] - Referral to Psychiatric NP has been made, currently scheduling Dec 2015 - Jan 2016 per pt's counselor.  Counselor inquiring if referral to [facility] would be sooner, [facility] is scheduling early 2016.  Left message requesting Mr. Berneice Heinrich, LCSW for [facility] to proceed with [facility] Psychiatric NP.  Mr. Berneice Heinrich did not recommend [facility] walk-in for patient due to potential for long wait.

## 2014-02-01 ENCOUNTER — Encounter: Payer: Self-pay | Admitting: *Deleted

## 2014-02-07 DIAGNOSIS — F331 Major depressive disorder, recurrent, moderate: Secondary | ICD-10-CM | POA: Diagnosis not present

## 2014-02-19 DIAGNOSIS — F411 Generalized anxiety disorder: Secondary | ICD-10-CM | POA: Diagnosis not present

## 2014-02-23 DIAGNOSIS — F331 Major depressive disorder, recurrent, moderate: Secondary | ICD-10-CM | POA: Diagnosis not present

## 2014-03-02 DIAGNOSIS — F331 Major depressive disorder, recurrent, moderate: Secondary | ICD-10-CM | POA: Diagnosis not present

## 2014-03-12 ENCOUNTER — Other Ambulatory Visit: Payer: Self-pay | Admitting: Internal Medicine

## 2014-03-14 ENCOUNTER — Encounter: Payer: Self-pay | Admitting: *Deleted

## 2014-03-19 DIAGNOSIS — F411 Generalized anxiety disorder: Secondary | ICD-10-CM | POA: Diagnosis not present

## 2014-03-20 ENCOUNTER — Ambulatory Visit (INDEPENDENT_AMBULATORY_CARE_PROVIDER_SITE_OTHER): Payer: Medicare Other | Admitting: Internal Medicine

## 2014-03-20 ENCOUNTER — Encounter: Payer: Self-pay | Admitting: Internal Medicine

## 2014-03-20 VITALS — BP 133/70 | HR 72 | Temp 97.9°F | Ht 63.5 in | Wt 110.1 lb

## 2014-03-20 DIAGNOSIS — F411 Generalized anxiety disorder: Secondary | ICD-10-CM

## 2014-03-20 MED ORDER — ALPRAZOLAM 1 MG PO TABS: 1.0000 mg | ORAL_TABLET | Freq: Three times a day (TID) | ORAL | Status: DC | PRN
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
## 2014-03-20 NOTE — Patient Instructions (Signed)
Please continue to attend your therapy sessions and your follow-up with Thomasene Lot, NP.  Your Xanax was refilled today for 2 months. Please continue to take it as needed (up to three times a day).  Continue to take your fluoxetine as prescribed by Thomasene Lot, NP.  General Instructions:   Please bring your medicines with you each time you come to clinic.  Medicines may include prescription medications, over-the-counter medications, herbal remedies, eye drops, vitamins, or other pills.   Progress Toward Treatment Goals:  No flowsheet data found.  Self Care Goals & Plans:  Self Care Goal 06/06/2013  Manage my medications take my medicines as prescribed; bring my medications to every visit; refill my medications on time; follow the sick day instructions if I am sick  Eat healthy foods eat more vegetables; eat fruit for snacks and desserts; eat foods that are low in salt; eat baked foods instead of fried foods; eat smaller portions  Be physically active find an activity I enjoy    No flowsheet data found.   Care Management & Community Referrals:  No flowsheet data found.
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
## 2014-03-20 NOTE — Progress Notes (Signed)
Internal Medicine Clinic Attending  Case discussed with Dr. Mallory at the time of the visit.  We reviewed the resident's history and exam and pertinent patient test results.  I agree with the assessment, diagnosis, and plan of care documented in the resident's note. 

## 2014-03-21 ENCOUNTER — Other Ambulatory Visit: Payer: Self-pay | Admitting: Internal Medicine

## 2014-03-21 NOTE — Progress Notes (Signed)
Was asked to refill Shawna Hawkins's omeprazole; does not appear that she has been tried off of omeprazole. Will refill until next appointment, at which time the plan will be discussed with her and trial off of omeprazole will be initiated. Patient is anxious about medication changes, so advise against stopping omeprazole over the phone.  Venita Lick, MD

## 2014-03-22 MED ORDER — OMEPRAZOLE 40 MG PO CPDR: 40.0000 mg | DELAYED_RELEASE_CAPSULE | Freq: Every day | ORAL | Status: DC

## 2014-03-22 NOTE — Addendum Note (Signed)
Addended by: Drucilla Schmidt E on: 03/22/2014 09:05 AM   Modules accepted: Orders

## 2014-04-13 DIAGNOSIS — F331 Major depressive disorder, recurrent, moderate: Secondary | ICD-10-CM | POA: Diagnosis not present

## 2014-04-13 DIAGNOSIS — F411 Generalized anxiety disorder: Secondary | ICD-10-CM | POA: Diagnosis not present

## 2014-04-16 DIAGNOSIS — F411 Generalized anxiety disorder: Secondary | ICD-10-CM | POA: Diagnosis not present

## 2014-04-27 DIAGNOSIS — F331 Major depressive disorder, recurrent, moderate: Secondary | ICD-10-CM | POA: Diagnosis not present

## 2014-04-27 DIAGNOSIS — F411 Generalized anxiety disorder: Secondary | ICD-10-CM | POA: Diagnosis not present

## 2014-05-25 DIAGNOSIS — F411 Generalized anxiety disorder: Secondary | ICD-10-CM | POA: Diagnosis not present

## 2014-05-25 DIAGNOSIS — F331 Major depressive disorder, recurrent, moderate: Secondary | ICD-10-CM | POA: Diagnosis not present

## 2014-06-01 DIAGNOSIS — F411 Generalized anxiety disorder: Secondary | ICD-10-CM | POA: Diagnosis not present

## 2014-06-01 DIAGNOSIS — F331 Major depressive disorder, recurrent, moderate: Secondary | ICD-10-CM | POA: Diagnosis not present

## 2014-06-14 ENCOUNTER — Other Ambulatory Visit: Payer: Self-pay | Admitting: Internal Medicine

## 2014-06-14 MED ORDER — ALPRAZOLAM 1 MG PO TABS: 1.0000 mg | ORAL_TABLET | Freq: Three times a day (TID) | ORAL | Status: DC | PRN

## 2014-06-14 NOTE — Telephone Encounter (Signed)
Rx called in 

## 2014-06-14 NOTE — Addendum Note (Signed)
Addended by: Drucilla Schmidt E on: 06/14/2014 03:54 PM   Modules accepted: Orders

## 2014-06-29 DIAGNOSIS — F331 Major depressive disorder, recurrent, moderate: Secondary | ICD-10-CM | POA: Diagnosis not present

## 2014-06-29 DIAGNOSIS — F411 Generalized anxiety disorder: Secondary | ICD-10-CM | POA: Diagnosis not present

## 2014-07-06 DIAGNOSIS — F411 Generalized anxiety disorder: Secondary | ICD-10-CM | POA: Diagnosis not present

## 2014-07-06 DIAGNOSIS — F331 Major depressive disorder, recurrent, moderate: Secondary | ICD-10-CM | POA: Diagnosis not present

## 2014-07-09 DIAGNOSIS — F411 Generalized anxiety disorder: Secondary | ICD-10-CM | POA: Diagnosis not present

## 2014-07-12 ENCOUNTER — Ambulatory Visit (INDEPENDENT_AMBULATORY_CARE_PROVIDER_SITE_OTHER): Payer: Medicare Other | Admitting: Internal Medicine

## 2014-07-12 ENCOUNTER — Encounter: Payer: Self-pay | Admitting: Internal Medicine

## 2014-07-12 VITALS — BP 122/86 | HR 96 | Temp 97.8°F | Ht 63.5 in | Wt 109.9 lb

## 2014-07-12 DIAGNOSIS — M720 Palmar fascial fibromatosis [Dupuytren]: Secondary | ICD-10-CM

## 2014-07-12 DIAGNOSIS — F329 Major depressive disorder, single episode, unspecified: Secondary | ICD-10-CM | POA: Diagnosis not present

## 2014-07-12 DIAGNOSIS — F17210 Nicotine dependence, cigarettes, uncomplicated: Secondary | ICD-10-CM

## 2014-07-12 DIAGNOSIS — F411 Generalized anxiety disorder: Secondary | ICD-10-CM

## 2014-07-12 MED ORDER — ALPRAZOLAM 1 MG PO TABS: 1.0000 mg | ORAL_TABLET | Freq: Two times a day (BID) | ORAL | Status: DC | PRN

## 2014-07-12 NOTE — Patient Instructions (Signed)
General Instructions:   Thank you for bringing your medicines today. This helps [place] keep you safe from mistakes.   Progress Toward Treatment Goals:  No flowsheet data found.  Self Care Goals & Plans:  Self Care Goal 07/12/2014  Manage my medications bring my medications to every visit; refill my medications on time  Eat healthy foods -  Be physically active find an activity I enjoy  Stop smoking cut down the number of cigarettes smoked    No flowsheet data found.   Care Management & Community Referrals:  No flowsheet data found.

## 2014-07-13 DIAGNOSIS — M720 Palmar fascial fibromatosis [Dupuytren]: Secondary | ICD-10-CM | POA: Insufficient documentation

## 2014-07-13 NOTE — Assessment & Plan Note (Addendum)
Patient complains of occasional pain in palm of right hand and "swollen tendon". Appears to have Dupuytren's contracture of the 2nd digit. + table top test. Not painful on exam. Not evident in any other digit. Patient not interested in orthopaedic assessment at this time, but would like to discuss at next appointment.  - Consider orthopaedic referral for Dupuytren's treatment (radiation therapy, needle aponeurotomy, collagenase injection, hand surgery)
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
## 2014-07-13 NOTE — Assessment & Plan Note (Signed)
Patient is being followed at [facility]. Current prescription is for buspirone (patient did not tolerate fluoxetine); patient has not filled the prescription for fear she will have side effects.  - Emphasized importance of starting to take buspirone, as eventually, xanax will be tapered and she will need other control of her anxiety and depression - Faxed release of information form to [place] to ensure open communication with Thomasene Lot, NP about medication plan (and xanax taper)

## 2014-07-13 NOTE — Assessment & Plan Note (Signed)
Patient was not taking her fluoxetine and has not started taking her buspirone (prescribed by psychiatric NP). Does find that therapy is helping. Took her xanax 1 mg TID PRN about three times per day, and ran out of it several days ago. Becomes very anxious when it has run out and is not available to her. Expresses understanding that she will need to be tapered off of it (discussed risks at length). Patient currently anxious about son and a possible lung procedure he will be requiring soon. Patient very anxious on exam.  - Encouraged trial of buspirone - Gave prescription for xanax 1 mg BID; emphasized importance of this change in frequency - Faxed release of information form to [place]; will need to coordinate taper with her psychiatric NP

## 2014-07-16 NOTE — Progress Notes (Signed)
Case discussed with Dr. Mallory at time of visit. We reviewed the resident's history and exam and pertinent patient test results. I agree with the assessment, diagnosis, and plan of care documented in the resident's note. 

## 2014-07-20 DIAGNOSIS — F331 Major depressive disorder, recurrent, moderate: Secondary | ICD-10-CM | POA: Diagnosis not present

## 2014-07-23 ENCOUNTER — Encounter: Payer: Self-pay | Admitting: *Deleted

## 2014-07-29 IMAGING — MG MM DIGITAL SCREENING BILAT
3 series · 3 of 3 positions shown · non-contrast
Comparison: 06/24/2011, 05/30/2008

CLINICAL DATA: Screening.

DIGITAL SCREENING BILATERAL MAMMOGRAM WITH CAD

[R MLO]
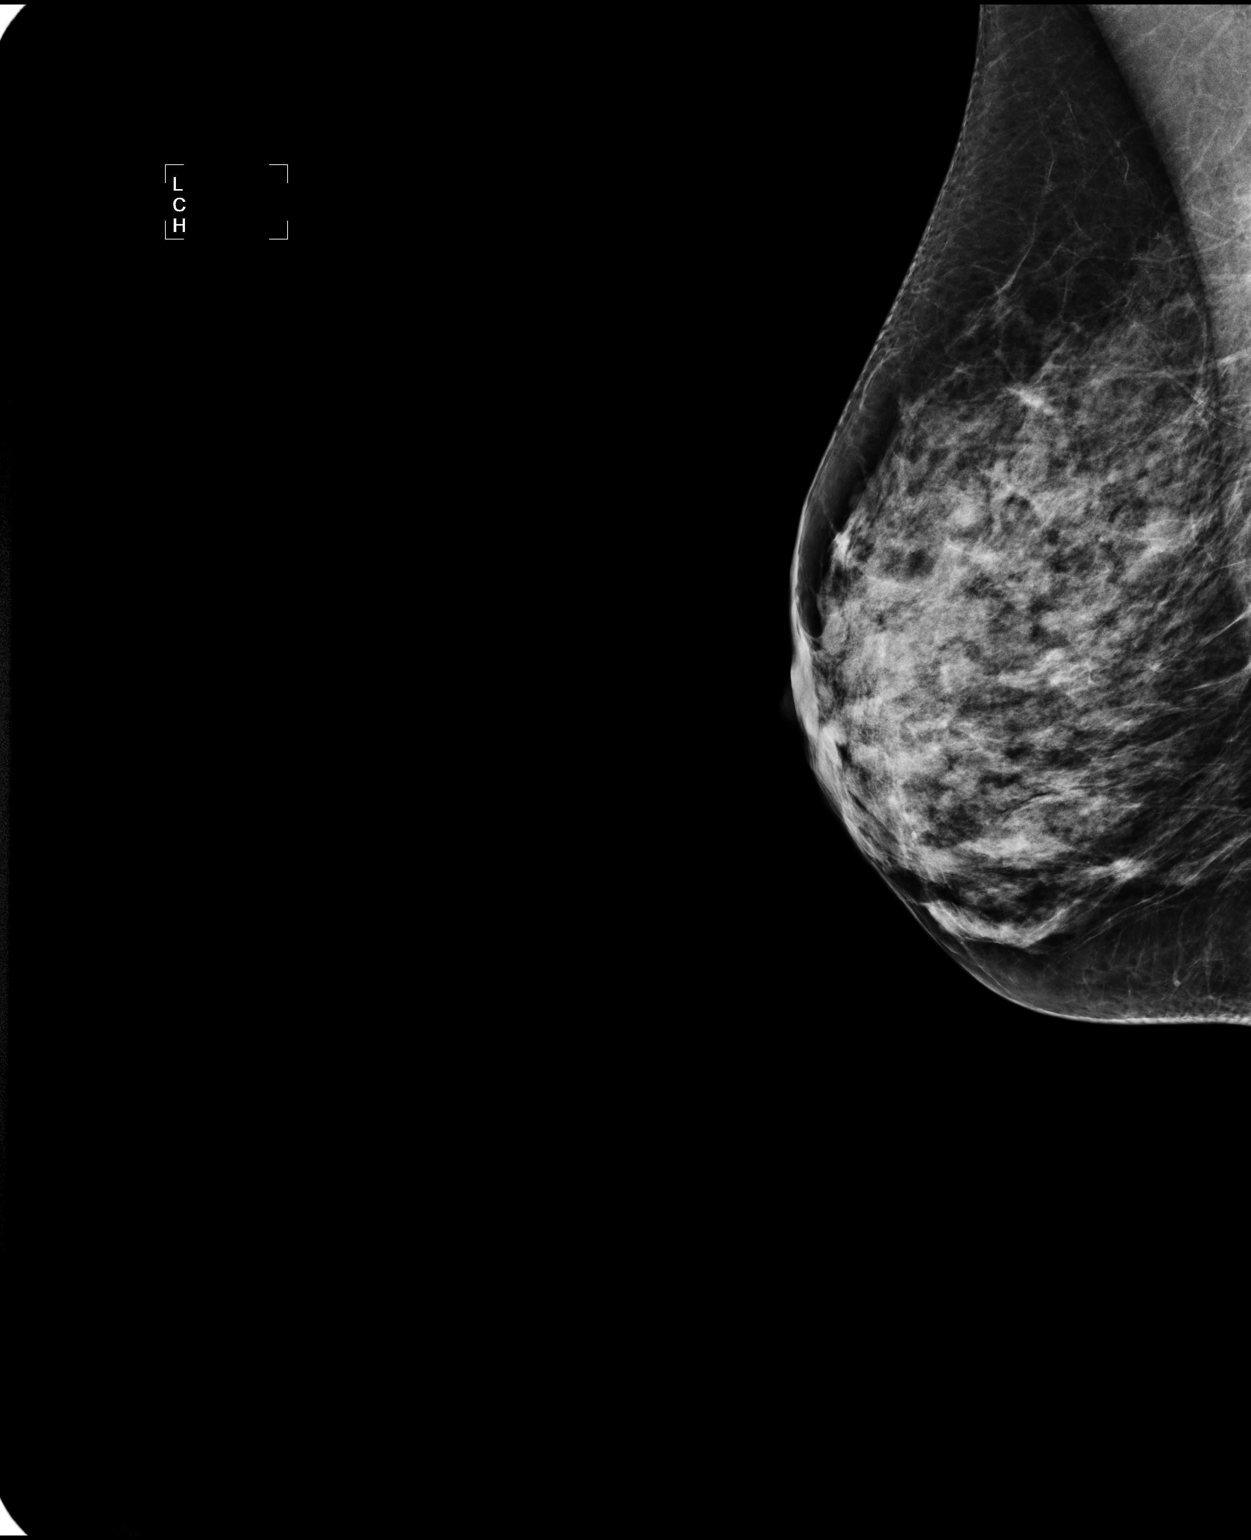

[L CC]
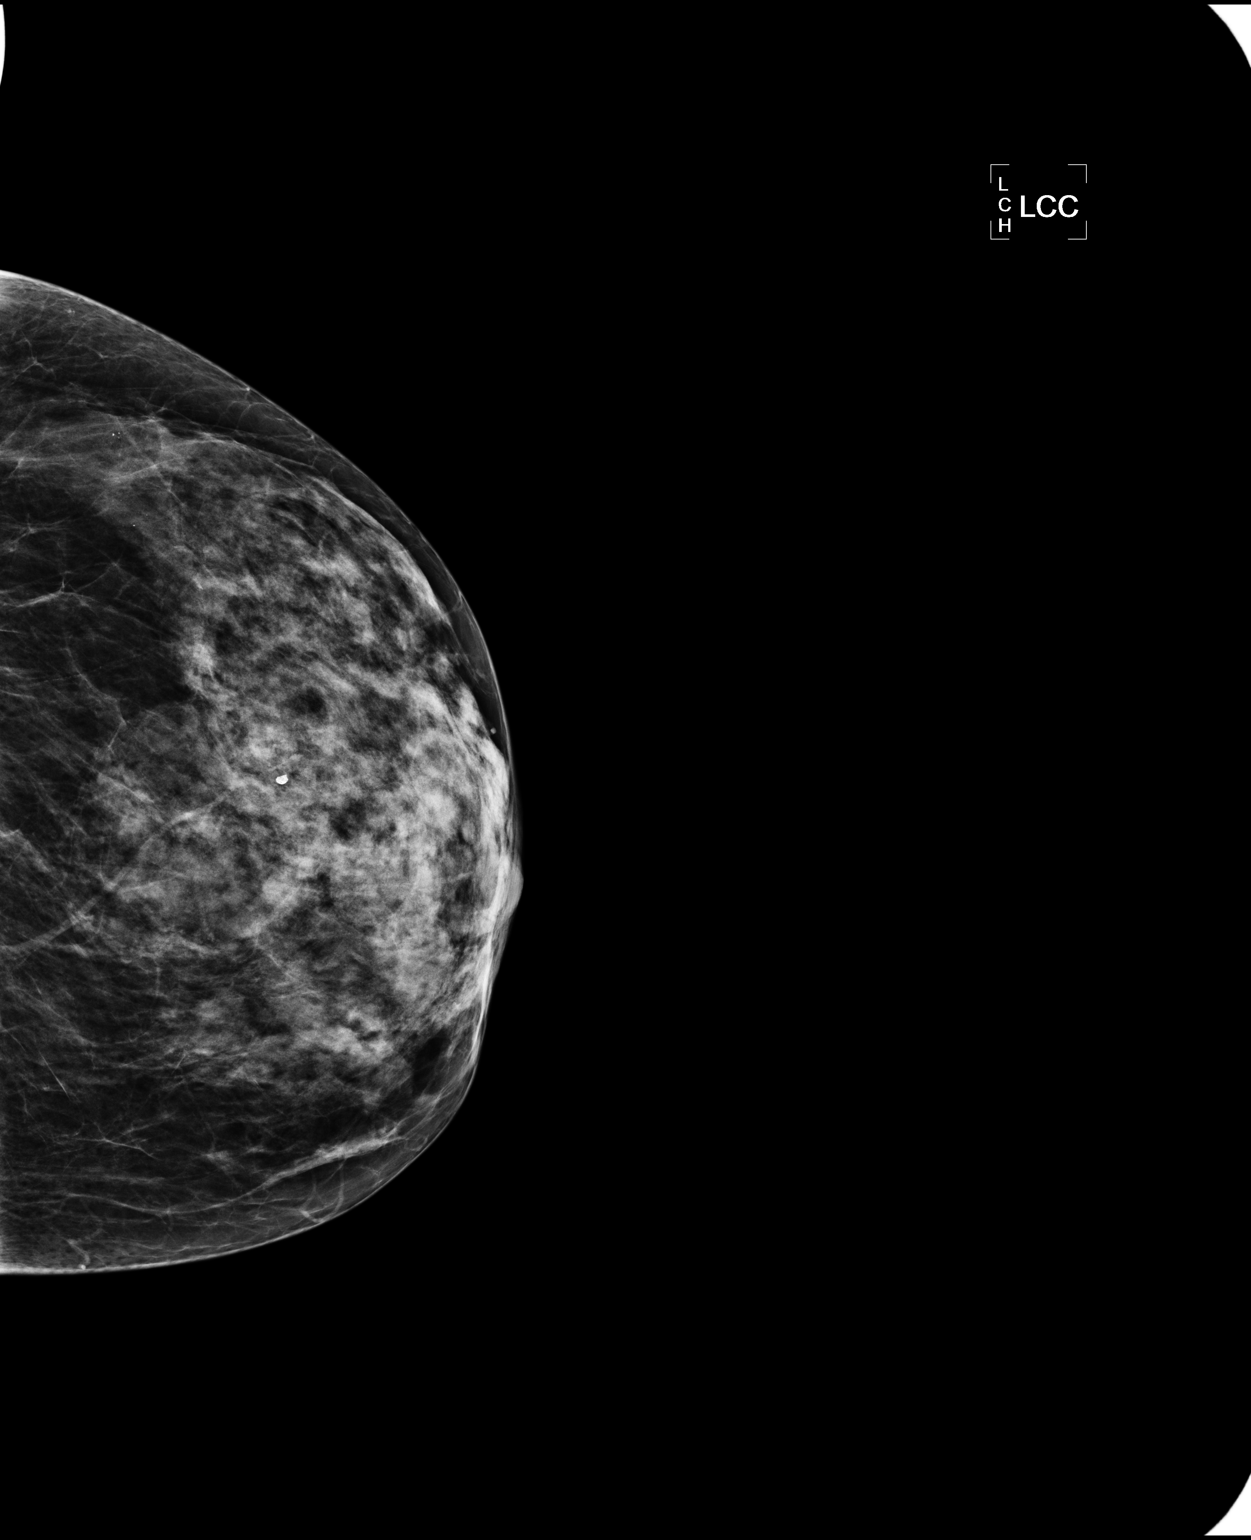

[L MLO]
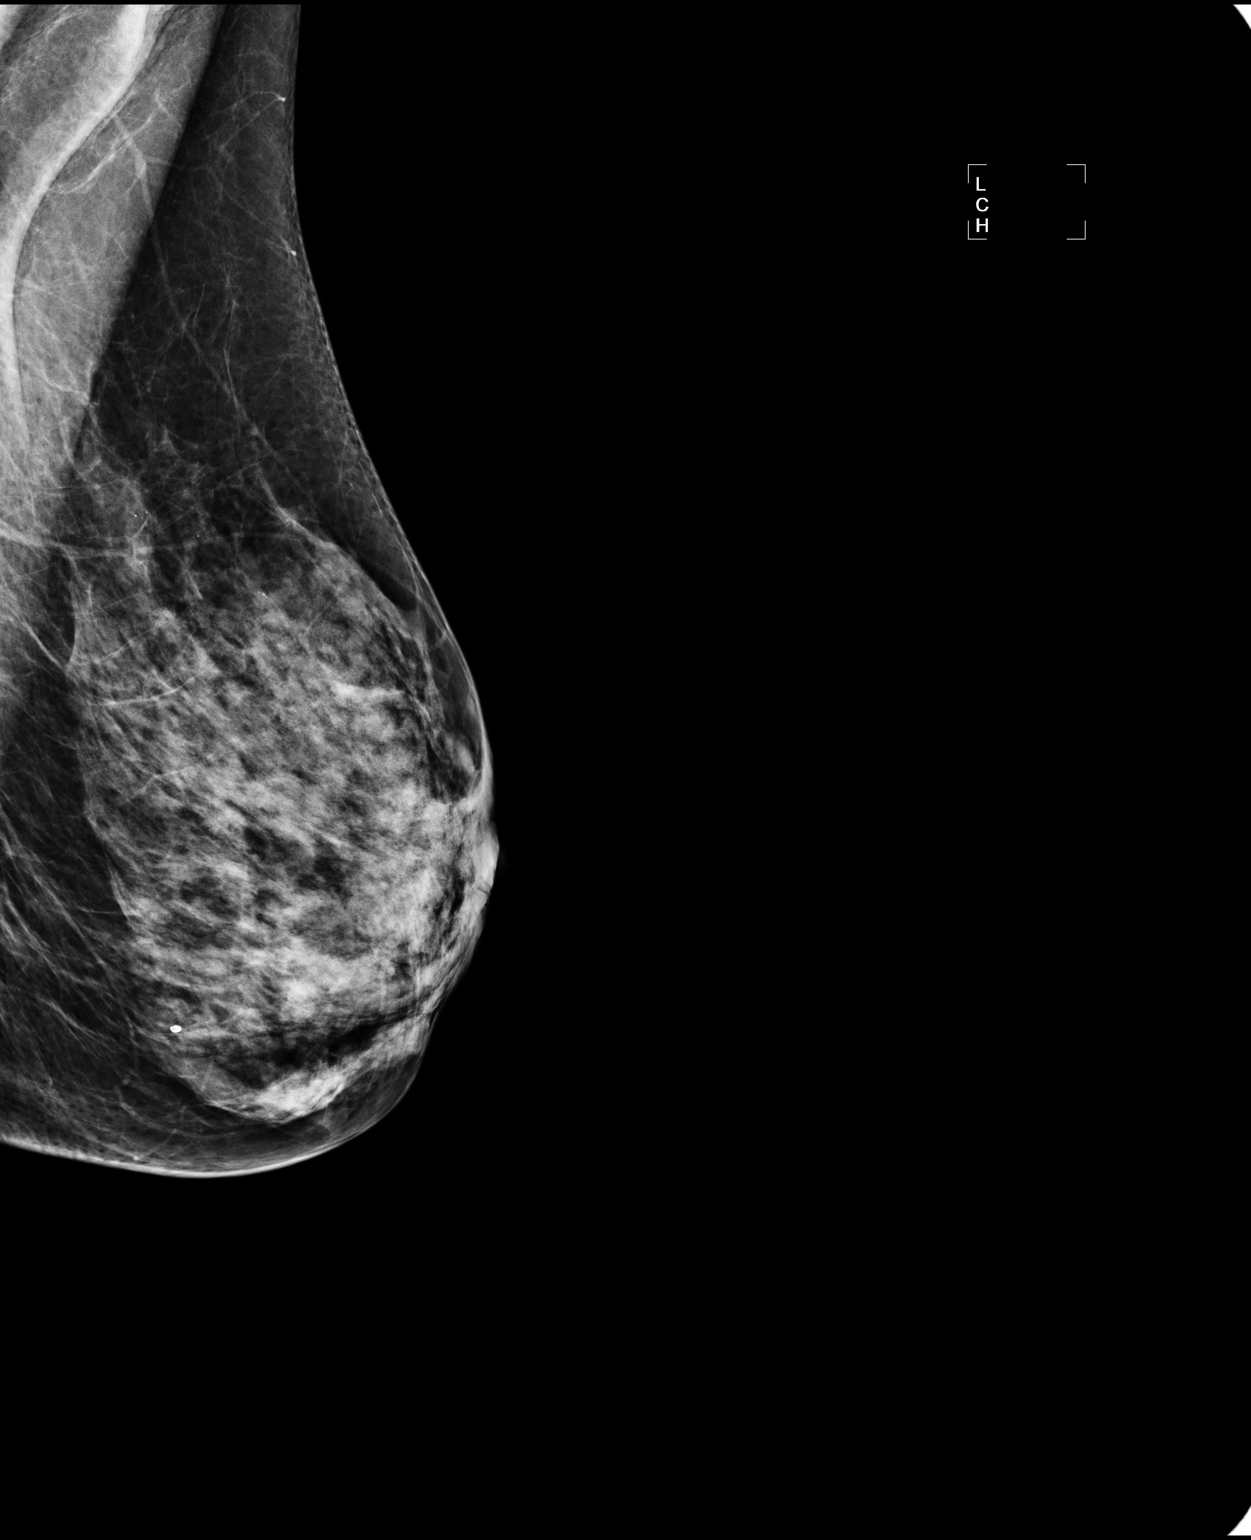

[3 of 3 positions shown; findings below may reference images not displayed]

FINDINGS: ACR Breast Density Category 3: The breast tissue is heterogeneously
dense.

There is a possible mass in the right breast.  Spot compression
views and possibly sonography are recommended for further
evaluation.  The left breast is unremarkable.

Images were processed with CAD.
IMPRESSION: Possible mass, right breast.  Spot compression views and possible
sonography are recommended for further evaluation.

RECOMMENDATION:
Diagnostic mammogram and possibly sonography of the right breast.
(Code:[id number]-00Y)

BI-RADS CATEGORY 0:  Incomplete.  Need additional imaging
evaluation and/or prior mammograms for comparison.

## 2014-08-03 DIAGNOSIS — F411 Generalized anxiety disorder: Secondary | ICD-10-CM | POA: Diagnosis not present

## 2014-08-10 DIAGNOSIS — F331 Major depressive disorder, recurrent, moderate: Secondary | ICD-10-CM | POA: Diagnosis not present

## 2014-08-14 ENCOUNTER — Encounter: Payer: Self-pay | Admitting: Internal Medicine

## 2014-08-14 ENCOUNTER — Ambulatory Visit (INDEPENDENT_AMBULATORY_CARE_PROVIDER_SITE_OTHER): Payer: Medicare Other | Admitting: Internal Medicine

## 2014-08-14 VITALS — BP 121/94 | HR 77 | Temp 98.0°F | Ht 63.5 in | Wt 111.5 lb

## 2014-08-14 DIAGNOSIS — B369 Superficial mycosis, unspecified: Secondary | ICD-10-CM | POA: Insufficient documentation

## 2014-08-14 DIAGNOSIS — F17210 Nicotine dependence, cigarettes, uncomplicated: Secondary | ICD-10-CM

## 2014-08-14 DIAGNOSIS — F411 Generalized anxiety disorder: Secondary | ICD-10-CM

## 2014-08-14 DIAGNOSIS — M720 Palmar fascial fibromatosis [Dupuytren]: Secondary | ICD-10-CM | POA: Diagnosis present

## 2014-08-14 DIAGNOSIS — F329 Major depressive disorder, single episode, unspecified: Secondary | ICD-10-CM

## 2014-08-14 DIAGNOSIS — E785 Hyperlipidemia, unspecified: Secondary | ICD-10-CM | POA: Diagnosis not present

## 2014-08-14 MED ORDER — OMEPRAZOLE 40 MG PO CPDR: 40.0000 mg | DELAYED_RELEASE_CAPSULE | Freq: Every day | ORAL | Status: DC

## 2014-08-14 MED ORDER — ALENDRONATE SODIUM 70 MG PO TABS: 70.0000 mg | ORAL_TABLET | ORAL | Status: DC

## 2014-08-14 MED ORDER — ATORVASTATIN CALCIUM 40 MG PO TABS: 40.0000 mg | ORAL_TABLET | Freq: Every day | ORAL | Status: DC

## 2014-08-14 NOTE — Assessment & Plan Note (Signed)
Patient has refused statin in the past, but has recently been on atrovastatin 40 mg by mouth daily. She refuses lipid panel today.  - Lipid panel at next visit
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
## 2014-08-14 NOTE — Patient Instructions (Signed)
Please continue to take your buspirone along with the Xanax twice per day. Your pharmacy confirmed that you have one refill left of the Xanax (it can be filled 30 days after your last refill).   You also have refills of your other medications.  You will get a lipid panel today; if you need to change your atorvastatin prescription, I will call you to let you know.  If your hand starts to bother you, a referral to orthopaedics is the next step.  General Instructions:   Thank you for bringing your medicines today. This helps [place] keep you safe from mistakes.   Progress Toward Treatment Goals:  No flowsheet data found.  Self Care Goals & Plans:  Self Care Goal 08/14/2014  Manage my medications take my medicines as prescribed; refill my medications on time; bring my medications to every visit  Eat healthy foods -  Be physically active find an activity I enjoy  Stop smoking cut down the number of cigarettes smoked    No flowsheet data found.   Care Management & Community Referrals:  No flowsheet data found.

## 2014-08-14 NOTE — Assessment & Plan Note (Addendum)
Patient receives counseling and her buspirone prescription through [facility] (fluoxetine recently failed due to side effects). She started her buspirone 5 days ago. She continues to have low motivation, difficulty with sleep and feelings of worthlessness. We discussed the importance of continuing to take the buspirone, even though she has not felt any benefits yet.   - Continue buspirone - Will route or fax this note to patient's counselor (NP at [place])

## 2014-08-14 NOTE — Assessment & Plan Note (Signed)
Patient recently failed fluoxetine, but started replacement buspirone 5 days ago (prescribed by her psychiatric NP). She is currently taking Xanax 1 mg by mouth twice per day and is very worried about this prescription running out. She realizes that she needs to continue to taper this medication, but complains of a variety of stressors at the moment (ill son who does not respect her, bus transportation, a difficult childhood). Patient continues to be very anxious on exam.  - Emphasized importance of continuing on buspirone - Patient has xanax through June; can refill at that time at BID dosing assuming that she is stable on this regimen - At next visit, can address continuing taper down to Xanax 1 mg by mouth once daily - Two-way release of information has been faxed to [place] and this note will be routed or faxed there in attempt to coordinate this taper with psychiatric NP

## 2014-08-14 NOTE — Assessment & Plan Note (Signed)
Patient has several 0.5 cm diameter fungal lesions on her lower extremities. Similar lesions have resolved with application of an over the counter fungal cream (she does not remember the name) in the past.   - Continue application of OTC fungal cream

## 2014-08-14 NOTE — Assessment & Plan Note (Signed)
Patient has not been experiencing pain in her right hand since her last visit. She appears to have a Dupuytren's contracture of the 2nd digit and has a positive table top test. There was no pain on exam.   - Patient made aware that orthopaedic referral (for radiation therapy, needle aponeurotomy, collagenase injection or surgery) would be the next step; she would like to put that off until the pain returns

## 2014-08-15 NOTE — Progress Notes (Signed)
Note was faxed to Thomasene Lot at [place] per Dr [facility] request (fax# [phone]). Called this am - stated it has been received.

## 2014-08-17 ENCOUNTER — Other Ambulatory Visit: Payer: Self-pay | Admitting: Internal Medicine

## 2014-08-17 NOTE — Addendum Note (Signed)
Addended by: Drucilla Schmidt E on: 08/17/2014 01:41 PM   Modules accepted: Orders

## 2014-08-20 NOTE — Progress Notes (Signed)
Case discussed with Dr. Mallory at time of visit. We reviewed the resident's history and exam and pertinent patient test results. I agree with the assessment, diagnosis, and plan of care documented in the resident's note. 

## 2014-08-31 DIAGNOSIS — F331 Major depressive disorder, recurrent, moderate: Secondary | ICD-10-CM | POA: Diagnosis not present

## 2014-08-31 DIAGNOSIS — F411 Generalized anxiety disorder: Secondary | ICD-10-CM | POA: Diagnosis not present

## 2014-09-03 DIAGNOSIS — F411 Generalized anxiety disorder: Secondary | ICD-10-CM | POA: Diagnosis not present

## 2014-09-07 DIAGNOSIS — F411 Generalized anxiety disorder: Secondary | ICD-10-CM | POA: Diagnosis not present

## 2014-09-07 DIAGNOSIS — F331 Major depressive disorder, recurrent, moderate: Secondary | ICD-10-CM | POA: Diagnosis not present

## 2014-09-10 ENCOUNTER — Other Ambulatory Visit: Payer: Self-pay

## 2014-09-24 ENCOUNTER — Other Ambulatory Visit: Payer: Self-pay | Admitting: Internal Medicine

## 2014-09-27 NOTE — Telephone Encounter (Signed)
Rx called in 

## 2014-09-27 NOTE — Telephone Encounter (Signed)
Okay to fill per Dr. Sandi Raveling last note.

## 2014-10-19 DIAGNOSIS — F331 Major depressive disorder, recurrent, moderate: Secondary | ICD-10-CM | POA: Diagnosis not present

## 2014-10-19 DIAGNOSIS — F411 Generalized anxiety disorder: Secondary | ICD-10-CM | POA: Diagnosis not present

## 2014-10-26 DIAGNOSIS — F331 Major depressive disorder, recurrent, moderate: Secondary | ICD-10-CM | POA: Diagnosis not present

## 2014-10-26 DIAGNOSIS — F411 Generalized anxiety disorder: Secondary | ICD-10-CM | POA: Diagnosis not present

## 2014-11-06 ENCOUNTER — Other Ambulatory Visit: Payer: Self-pay | Admitting: *Deleted

## 2014-11-07 ENCOUNTER — Other Ambulatory Visit: Payer: Self-pay | Admitting: Internal Medicine

## 2014-11-08 MED ORDER — OMEPRAZOLE 40 MG PO CPDR: 40.0000 mg | DELAYED_RELEASE_CAPSULE | Freq: Every day | ORAL | Status: DC

## 2014-11-12 ENCOUNTER — Ambulatory Visit: Payer: Self-pay | Admitting: Internal Medicine

## 2014-11-20 ENCOUNTER — Encounter: Payer: Self-pay | Admitting: Internal Medicine

## 2014-11-23 DIAGNOSIS — F411 Generalized anxiety disorder: Secondary | ICD-10-CM | POA: Diagnosis not present

## 2014-11-23 DIAGNOSIS — F331 Major depressive disorder, recurrent, moderate: Secondary | ICD-10-CM | POA: Diagnosis not present

## 2014-12-25 ENCOUNTER — Ambulatory Visit (INDEPENDENT_AMBULATORY_CARE_PROVIDER_SITE_OTHER): Payer: Medicare Other | Admitting: Internal Medicine

## 2014-12-25 ENCOUNTER — Encounter: Payer: Self-pay | Admitting: Internal Medicine

## 2014-12-25 VITALS — BP 150/101 | HR 77 | Temp 97.5°F | Wt 117.6 lb

## 2014-12-25 DIAGNOSIS — F411 Generalized anxiety disorder: Secondary | ICD-10-CM | POA: Diagnosis present

## 2014-12-25 DIAGNOSIS — R030 Elevated blood-pressure reading, without diagnosis of hypertension: Secondary | ICD-10-CM | POA: Diagnosis not present

## 2014-12-25 DIAGNOSIS — F339 Major depressive disorder, recurrent, unspecified: Secondary | ICD-10-CM

## 2014-12-25 DIAGNOSIS — F17210 Nicotine dependence, cigarettes, uncomplicated: Secondary | ICD-10-CM | POA: Diagnosis not present

## 2014-12-25 DIAGNOSIS — Z8639 Personal history of other endocrine, nutritional and metabolic disease: Secondary | ICD-10-CM | POA: Diagnosis not present

## 2014-12-25 DIAGNOSIS — C50311 Malignant neoplasm of lower-inner quadrant of right female breast: Secondary | ICD-10-CM | POA: Diagnosis not present

## 2014-12-25 DIAGNOSIS — IMO0001 Reserved for inherently not codable concepts without codable children: Secondary | ICD-10-CM

## 2014-12-25 MED ORDER — ALPRAZOLAM 1 MG PO TABS: 1.0000 mg | ORAL_TABLET | Freq: Two times a day (BID) | ORAL | Status: DC | PRN

## 2014-12-25 NOTE — Assessment & Plan Note (Signed)
Signs and symptoms of depression could not be assessed today because patient was talking constantly and digressing a lot. She did report taking Buspirone which she received from her psychiatrist at [facility]. Patient filled out the PHQ-9 questionnaire and her score was 11 today, consistent with mild depression.  -Obtain records from [place] [facility] -Assess signs and symptoms at next visit

## 2014-12-25 NOTE — Assessment & Plan Note (Signed)
BP 150/101 today. Patient does not have a documented history of HTN and currently not on any blood pressure medication. Records showing a systolic blood pressure of 120s on average and diastolic of 60A.  -Recheck at next visit.

## 2014-12-25 NOTE — Assessment & Plan Note (Signed)
Last lipid panel from 11/2011 showing Cholesterol 456 and HDL 30. Triglycerides high (500). Currently on Lipitor 40 mg qd and it is not clear from the history whether the patient is taking this medication. She refused labs today. -Lipid panel at next visit

## 2014-12-25 NOTE — Assessment & Plan Note (Signed)
Patient was requesting a refill on her Xanax. 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 States she is currently visiting a psychiatrist at [facility]. Also visiting a psychiatry NP named Gala Murdoch and a counselor named Curley Spice. States she was given Buspirone by her psychiatrist to help with both depression and anxiety. However, patient states she still needs Xanax because it helps her sleep at night. Reports taking 1/2 tablet in the morning, one before going to bed at night, and another 1/2 tablet if she wakes up at night. It was difficult to obtain a good history from the patient because she was talking constantly and digressing a lot. GAD-7 score 13 today, consistent with moderate anxiety.  -Xanax 1 mg BID refilled today, #60, zero refills -Obtain records from [place] [facility]  -F/u visit in 1 month.

## 2014-12-25 NOTE — Patient Instructions (Signed)
Shawna Hawkins it was nice meeting you today.  -I have refilled your Xanax prescription today.  -Please return for a follow-up visit in 1 month.
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
## 2014-12-25 NOTE — Assessment & Plan Note (Signed)
Patient was last seen by Oncology in 11/2012 and prescribed Anastrozole. Patient states she stopped taking the medication and never followed-up with oncology. She currently denies having any fevers or weight loss.  -Assess more at next visit in 1 month

## 2014-12-26 NOTE — Addendum Note (Signed)
Addended by: Larey Dresser A on: 12/26/2014 03:10 PM   Modules accepted: Level of Service

## 2014-12-26 NOTE — Progress Notes (Signed)
Internal Medicine Clinic Attending  I saw and evaluated the patient.  I personally confirmed the key portions of the history and exam documented by Dr. Rathore and I reviewed pertinent patient test results.  The assessment, diagnosis, and plan were formulated together and I agree with the documentation in the resident's note.  

## 2014-12-27 DIAGNOSIS — F411 Generalized anxiety disorder: Secondary | ICD-10-CM | POA: Diagnosis not present

## 2014-12-28 DIAGNOSIS — F411 Generalized anxiety disorder: Secondary | ICD-10-CM | POA: Diagnosis not present

## 2015-01-04 DIAGNOSIS — F411 Generalized anxiety disorder: Secondary | ICD-10-CM | POA: Diagnosis not present

## 2015-01-28 ENCOUNTER — Other Ambulatory Visit: Payer: Self-pay | Admitting: Internal Medicine

## 2015-01-28 NOTE — Telephone Encounter (Signed)
Pt requesting xanax to be filled @ [facility] on [address] [facility] [address] [facility] [address] [facility].

## 2015-01-28 NOTE — Telephone Encounter (Signed)
Sent via surescripts

## 2015-01-31 NOTE — Telephone Encounter (Signed)
rx phoned in

## 2015-02-01 ENCOUNTER — Other Ambulatory Visit: Payer: Self-pay | Admitting: Internal Medicine

## 2015-02-05 ENCOUNTER — Encounter: Payer: Self-pay | Admitting: Student

## 2015-02-22 DIAGNOSIS — F411 Generalized anxiety disorder: Secondary | ICD-10-CM | POA: Diagnosis not present

## 2015-02-26 ENCOUNTER — Encounter: Payer: Self-pay | Admitting: Internal Medicine

## 2015-02-26 ENCOUNTER — Ambulatory Visit (INDEPENDENT_AMBULATORY_CARE_PROVIDER_SITE_OTHER): Payer: Medicare Other | Admitting: Internal Medicine

## 2015-02-26 VITALS — BP 139/80 | HR 86 | Temp 98.0°F | Ht 63.0 in | Wt 116.5 lb

## 2015-02-26 DIAGNOSIS — F17210 Nicotine dependence, cigarettes, uncomplicated: Secondary | ICD-10-CM

## 2015-02-26 DIAGNOSIS — K219 Gastro-esophageal reflux disease without esophagitis: Secondary | ICD-10-CM

## 2015-02-26 DIAGNOSIS — C50311 Malignant neoplasm of lower-inner quadrant of right female breast: Secondary | ICD-10-CM | POA: Diagnosis not present

## 2015-02-26 DIAGNOSIS — M79661 Pain in right lower leg: Secondary | ICD-10-CM | POA: Diagnosis not present

## 2015-02-26 DIAGNOSIS — F411 Generalized anxiety disorder: Secondary | ICD-10-CM | POA: Diagnosis not present

## 2015-02-26 DIAGNOSIS — M898X6 Other specified disorders of bone, lower leg: Secondary | ICD-10-CM

## 2015-02-26 DIAGNOSIS — Z9119 Patient's noncompliance with other medical treatment and regimen: Secondary | ICD-10-CM | POA: Diagnosis not present

## 2015-02-26 DIAGNOSIS — F339 Major depressive disorder, recurrent, unspecified: Secondary | ICD-10-CM

## 2015-02-26 DIAGNOSIS — F329 Major depressive disorder, single episode, unspecified: Secondary | ICD-10-CM

## 2015-02-26 DIAGNOSIS — E785 Hyperlipidemia, unspecified: Secondary | ICD-10-CM | POA: Diagnosis not present

## 2015-02-26 MED ORDER — CAPSAICIN 0.1 % EX CREA: TOPICAL_CREAM | CUTANEOUS | Status: DC

## 2015-02-26 MED ORDER — OMEPRAZOLE 40 MG PO CPDR: 40.0000 mg | DELAYED_RELEASE_CAPSULE | Freq: Every day | ORAL | Status: DC

## 2015-02-26 MED ORDER — ALPRAZOLAM 1 MG PO TABS: 1.0000 mg | ORAL_TABLET | Freq: Two times a day (BID) | ORAL | Status: DC | PRN

## 2015-02-28 ENCOUNTER — Telehealth: Payer: Self-pay

## 2015-02-28 DIAGNOSIS — M898X6 Other specified disorders of bone, lower leg: Secondary | ICD-10-CM | POA: Insufficient documentation

## 2015-02-28 NOTE — Assessment & Plan Note (Signed)
Patient is currently on Lipitor 40 mg qd and due for a lipid panel. -Lipid panel ordered at this visit, however, patient was in a hurry and stated she will return to the clinic at a later time to get her blood drawn.
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
## 2015-02-28 NOTE — Assessment & Plan Note (Addendum)
Patient abruptly stopped taking Anastrazole and stopped following up with oncology since 11/2012. She denies having any fevers or unintentional weight loss. She has not had a repeat mammo since 2014. I discussed the importance of getting a repeat mammogram with patient because she stopped her treatment abruptly and stopped following up with oncology over 2 years ago. I also discussed my concern about the risks involved in not getting a repeat mammogram done urgently. Patient verbally expressed her understanding of the benefits & risks and adamantly refused to get a repeat mammogram. I also offered her referral to oncology and she refused stating she does not ever want to see an oncologist and does not ever want to get a mammogram done.

## 2015-02-28 NOTE — Telephone Encounter (Signed)
calld in printed rx for xanax 1mg - physical rx destroyed.

## 2015-02-28 NOTE — Assessment & Plan Note (Signed)
Patient reports being on Prozac 20 mg and states she gets headaches with higher doses. States she stopped taking Buspirone and it was not clear from the history why.  -Obtain records from [facility] -Cont Prozac 20 mg qd -F/u visit in 1 month

## 2015-02-28 NOTE — Assessment & Plan Note (Addendum)
Patient is complaining of mild point tenderness on her right tibia s/p mechanical fall 2 months ago. She is able to bear weight on that leg and ambulate without any problem, making a fracture less likely. Pain is most likely musculoskeletal in nature.  -Capsaicin cream prn -Reassess at next visit
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
## 2015-02-28 NOTE — Assessment & Plan Note (Signed)
Patient requested refill on PPI as she continues to have occasional GERD symptoms. -Omeprazole 40 mg qd refilled today

## 2015-03-04 NOTE — Progress Notes (Signed)
Internal Medicine Clinic Attending  I saw and evaluated the patient.  I personally confirmed the key portions of the history and exam documented by Dr. Rathore and I reviewed pertinent patient test results.  The assessment, diagnosis, and plan were formulated together and I agree with the documentation in the resident's note.  

## 2015-03-05 ENCOUNTER — Telehealth: Payer: Self-pay | Admitting: Internal Medicine

## 2015-03-05 NOTE — Telephone Encounter (Signed)
Called pharm capsacian cream is not something insurance will pay for, if there is something that you can order that insurance usually covers please do so

## 2015-03-05 NOTE — Telephone Encounter (Signed)
Pt requesting capsaicin cream to be filled and want it to be covered through the insurance. Please call pt back.

## 2015-03-07 NOTE — Telephone Encounter (Signed)
I have tried calling the patient but unfortunately have not been able to reach her over the phone. Please ask patient to take over the counter oral Motrin (as instructed on the manufacturer's packaging) for the pain. If her pain does not improve or worsens, please ask her to return to the clinic to be reevaluated. Thanks.

## 2015-03-07 NOTE — Addendum Note (Signed)
Addended by: Charlesetta Shanks on: 03/07/2015 02:32 PM   Modules accepted: Orders, Medications

## 2015-03-15 DIAGNOSIS — F411 Generalized anxiety disorder: Secondary | ICD-10-CM | POA: Diagnosis not present

## 2015-03-20 NOTE — Telephone Encounter (Signed)
Unable to reach pt, will await her rtc

## 2015-03-21 DIAGNOSIS — F411 Generalized anxiety disorder: Secondary | ICD-10-CM | POA: Diagnosis not present

## 2015-03-22 DIAGNOSIS — F411 Generalized anxiety disorder: Secondary | ICD-10-CM | POA: Diagnosis not present

## 2015-03-26 ENCOUNTER — Other Ambulatory Visit: Payer: Self-pay | Admitting: Internal Medicine

## 2015-03-26 NOTE — Addendum Note (Signed)
Addended by: Charlesetta Shanks on: 03/26/2015 02:41 AM   Modules accepted: Orders

## 2015-03-31 ENCOUNTER — Other Ambulatory Visit: Payer: Self-pay | Admitting: Internal Medicine

## 2015-04-02 NOTE — Telephone Encounter (Signed)
rx phoned to [facility]

## 2015-04-05 DIAGNOSIS — F331 Major depressive disorder, recurrent, moderate: Secondary | ICD-10-CM | POA: Diagnosis not present

## 2015-04-09 ENCOUNTER — Other Ambulatory Visit (INDEPENDENT_AMBULATORY_CARE_PROVIDER_SITE_OTHER): Payer: Medicare Other

## 2015-04-09 DIAGNOSIS — E785 Hyperlipidemia, unspecified: Secondary | ICD-10-CM | POA: Diagnosis not present

## 2015-04-10 LAB — LIPID PANEL
Chol/HDL Ratio: 9.4 ratio units — ABNORMAL HIGH (ref 0.0–4.4)
Cholesterol, Total: 318 mg/dL — ABNORMAL HIGH (ref 100–199)
HDL: 34 mg/dL — ABNORMAL LOW (ref >39)
LDL CALC: 232 mg/dL — AB (ref 0–99)
Triglycerides: 262 mg/dL — ABNORMAL HIGH (ref 0–149)
VLDL CHOLESTEROL CAL: 52 mg/dL — AB (ref 5–40)

## 2015-04-17 ENCOUNTER — Encounter: Payer: Self-pay | Admitting: Internal Medicine

## 2015-04-17 ENCOUNTER — Ambulatory Visit (INDEPENDENT_AMBULATORY_CARE_PROVIDER_SITE_OTHER): Payer: Medicare Other | Admitting: Internal Medicine

## 2015-04-17 VITALS — BP 160/79 | HR 74 | Temp 97.6°F | Ht 63.0 in | Wt 118.5 lb

## 2015-04-17 DIAGNOSIS — E785 Hyperlipidemia, unspecified: Secondary | ICD-10-CM

## 2015-04-17 DIAGNOSIS — M79661 Pain in right lower leg: Secondary | ICD-10-CM | POA: Diagnosis not present

## 2015-04-17 DIAGNOSIS — M898X6 Other specified disorders of bone, lower leg: Secondary | ICD-10-CM

## 2015-04-17 DIAGNOSIS — F17210 Nicotine dependence, cigarettes, uncomplicated: Secondary | ICD-10-CM | POA: Diagnosis not present

## 2015-04-17 DIAGNOSIS — F17200 Nicotine dependence, unspecified, uncomplicated: Secondary | ICD-10-CM | POA: Insufficient documentation

## 2015-04-17 DIAGNOSIS — F411 Generalized anxiety disorder: Secondary | ICD-10-CM

## 2015-04-17 NOTE — Patient Instructions (Signed)
I want you to return in 5-6 weeks fasting for a cholesterol panel.  During this time you need to take Atorvastatin 40mg  every day.  If you cholesterol is not at goal you will likely need to be increased to 80mg  a day, if still not at goal we may consider a medication called Zetia.  Lipid Panel     Component Value Date/Time   CHOL 318* 04/09/2015 1348   CHOL 456* 11/17/2011 1618   TRIG 262* 04/09/2015 1348   TRIG 360 11/23/2005   HDL 34* 04/09/2015 1348   HDL 30* 11/17/2011 1618   CHOLHDL 9.4* 04/09/2015 1348   CHOLHDL 15.2 11/17/2011 1618   VLDL NOT CALC 11/17/2011 1618   LDLCALC 232* 04/09/2015 1348   LDLCALC  11/17/2011 1618     Comment:       Not calculated due to Triglyceride >400. Suggest ordering Direct LDL (Unit Code: [phone]).   Total Cholesterol/HDL Ratio:CHD Risk                        Coronary Heart Disease Risk Table                                        Men       Women          1/2 Average Risk              3.4        3.3              Average Risk              5.0        4.4           2X Average Risk              9.6        7.1           3X Average Risk             23.4       11.0 Use the calculated Patient Ratio above and the CHD Risk table  to determine the patient's CHD Risk. ATP III Classification (LDL):       < 100        mg/dL         Optimal      100 - 129     mg/dL         Near or Above Optimal      130 - 159     mg/dL         Borderline High      160 - 189     mg/dL         High       > 190        mg/dL         Very High     LDLDIRECT 180* 11/18/2011 1618    General Instructions:   Please bring your medicines with you each time you come to clinic.  Medicines may include prescription medications, over-the-counter medications, herbal remedies, eye drops, vitamins, or other pills.   Progress Toward Treatment Goals:  No flowsheet data found.  Self Care Goals & Plans:  Self Care Goal 12/25/2014  Manage my medications take my medicines as  prescribed; bring my medications to every visit  Eat healthy foods eat foods that are low in salt; eat baked foods instead of fried foods  Be physically active find an activity I enjoy  Stop smoking go to the [place] website (https://scott-booker.info/); cut down the number of cigarettes smoked    No flowsheet data found.   Care Management & Community Referrals:  No flowsheet data found.

## 2015-04-17 NOTE — Assessment & Plan Note (Signed)
-   Improving - Continue OTC NSAIDS

## 2015-04-17 NOTE — Assessment & Plan Note (Signed)
-   In pre contemplation stage - Discussed the impact of smoking on cholesterol and risk of stroke and heart attack, spent a dedicated 4 minutes on smoking cessation.

## 2015-04-17 NOTE — Addendum Note (Signed)
Addended by: Lucious Groves on: 04/17/2015 03:16 PM   Modules accepted: Orders

## 2015-04-17 NOTE — Progress Notes (Signed)
[facility] Subjective:   Patient ID: Shawna Hawkins female   DOB: Mar 22, 1945 70 y.o.   MRN: [id number]  HPI: [name] is a 70 y.o. female with a PMH detailed below who presents for follow up of high cholesterol.  She had a recent non fasting lipid panel 1 week ago that showed hyperlipidemia, she is an active smoker and is prescribed atorvastatin 40mg  daily, she takes this intermittently.  She is not sure it is effective.  She also reports anxiety and depression, she tells me about the last number of providers she has had an various changes in her medications over the last number of years. She was concerned that she was reduced last year from 3mg  of xanax a day to 2mg  of xanax a day.  She has seen a provider at [facility] who feels that was reduced too quickly.  She also reports that she is taking her fluoxetine daily but was not able to tolerate Buspar.  She also reports a lot of anxiety surrounding an illness of her son (needs lung transplant).  She has also had some right leg pain after a fall.  She was unable to afford caspacin cream and has been taking an OTC cream that smells bad and occasional Ibuprofen.  The pain is improving.  She has not had further falls.    Past Medical History  Diagnosis Date  . Depression   . Anxiety   . Hyperlipidemia   . COPD (chronic obstructive pulmonary disease) ([place])   . Bronchitis   . Heat exhaustion   . Anemia     as a young woman   Current Outpatient Prescriptions  Medication Sig Dispense Refill  . alendronate (FOSAMAX) 70 MG tablet Take 1 tablet (70 mg total) by mouth every 7 (seven) days. Take with a full glass of water on an empty stomach. (Patient not taking: Reported on 12/25/2014) 56 tablet 0  . ALPRAZolam (XANAX) 1 MG tablet TAKE 1 TABLET BY MOUTH TWICE DAILY AS NEEDED FOR ANXIETY 60 tablet 0  . anastrozole (ARIMIDEX) 1 MG tablet TAKE 1 TABLET BY MOUTH EVERY DAY (Patient not taking: Reported on  12/25/2014) 90 tablet 0  . atorvastatin (LIPITOR) 40 MG tablet TAKE 1 TABLET BY MOUTH DAILY 90 tablet 0  . FLUoxetine (PROZAC) 20 MG capsule     . omeprazole (PRILOSEC) 40 MG capsule Take 1 capsule (40 mg total) by mouth daily. 90 capsule 0   No current facility-administered medications for this visit.   Family History  Problem Relation Age of Onset  . Depression Mother   . Cancer Mother     lung   Social History   Social History  . Marital Status: Single    Spouse Name: N/A  . Number of Children: N/A  . Years of Education: N/A   Social History Main Topics  . Smoking status: Current Every Day Smoker -- 0.50 packs/day for 30 years    Types: Cigarettes  . Smokeless tobacco: Never Used     Comment: "trying to quit"  . Alcohol Use: No  . Drug Use: No  . Sexual Activity: Not Asked   Other Topics Concern  . None   Social History Narrative   Review of Systems: Review of Systems  Constitutional: Negative for fever and chills.  Eyes: Negative for blurred vision.  Respiratory: Negative for cough and shortness of breath.   Cardiovascular: Negative for chest pain.  Gastrointestinal: Negative for heartburn.  Musculoskeletal: Negative for myalgias  and falls.  Neurological: Negative for dizziness.  Psychiatric/Behavioral: Positive for depression. Negative for suicidal ideas and substance abuse. The patient is nervous/anxious and has insomnia (if she doesnt take xanax).      Objective:  Physical Exam: Filed Vitals:   04/17/15 1347  BP: 160/79  Pulse: 74  Temp: 97.6 F (36.4 C)  TempSrc: Oral  Height: 5\' 3"  (1.6 m)  Weight: 118 lb 8 oz (53.751 kg)  SpO2: 99%  Physical Exam  Constitutional: She is well-developed, well-nourished, and in no distress.  Cardiovascular: Normal rate and regular rhythm.   Pulmonary/Chest: Effort normal and breath sounds normal.  Psychiatric: Memory normal. Her affect is labile.  Nursing note and vitals reviewed.   Assessment & Plan:  Case  discussed with Dr. Evette Doffing  Face to face time with patient was 25 minutes. Over 50% of this time was spent on counseling and coordination of care.   Hyperlipidemia A: Hyperlipidema  P: Too soon to recheck lipid panel, checking LFTs with stable history is not indicated. - Return in 5-6 weeks for fasting lipid panel and visit with PCP - Advised strict adherence to atorvastatin (explained benefits of statin therapy at length) - If not at goal would increase to 80mg  daily, if still remains under goal would add zetia.  Pain of right tibia - Improving - Continue OTC NSAIDS  Tobacco use disorder - In pre contemplation stage - Discussed the impact of smoking on cholesterol and risk of stroke and heart attack, spent a dedicated 4 minutes on smoking cessation.  Generalized anxiety disorder - Continue SSRI and BZD    Medications Ordered Meds ordered this encounter  Medications  . FLUoxetine (PROZAC) 20 MG capsule    Sig:    Other Orders Orders Placed This Encounter  Procedures  . Lipid Profile   Follow Up: Return 5-6 weeks with Dr Marlowe Sax if possible.

## 2015-04-17 NOTE — Assessment & Plan Note (Signed)
-   Continue SSRI and BZD

## 2015-04-17 NOTE — Assessment & Plan Note (Signed)
A: Hyperlipidema  P: Too soon to recheck lipid panel, checking LFTs with stable history is not indicated. - Return in 5-6 weeks for fasting lipid panel and visit with PCP - Advised strict adherence to atorvastatin (explained benefits of statin therapy at length) - If not at goal would increase to 80mg  daily, if still remains under goal would add zetia.

## 2015-04-19 DIAGNOSIS — F331 Major depressive disorder, recurrent, moderate: Secondary | ICD-10-CM | POA: Diagnosis not present

## 2015-04-19 NOTE — Progress Notes (Signed)
Internal Medicine Clinic Attending  Case discussed with Dr. Hoffman at the time of the visit.  We reviewed the resident's history and exam and pertinent patient test results.  I agree with the assessment, diagnosis, and plan of care documented in the resident's note.  

## 2015-05-03 ENCOUNTER — Other Ambulatory Visit: Payer: Self-pay | Admitting: Internal Medicine

## 2015-05-09 ENCOUNTER — Telehealth: Payer: Self-pay | Admitting: Internal Medicine

## 2015-05-09 NOTE — Telephone Encounter (Signed)
NEEDS MEDICATION REFILLS, NOT SURE WANT SHE NEEDS, WANTS NURSE TO CALL [phone], [address]

## 2015-05-09 NOTE — Telephone Encounter (Signed)
Rtc, got vmail, lm for rtc 

## 2015-05-10 NOTE — Telephone Encounter (Signed)
Called pt again, she wanted to know about her xanax, called it to [facility]

## 2015-05-12 ENCOUNTER — Other Ambulatory Visit: Payer: Self-pay | Admitting: Internal Medicine

## 2015-05-13 ENCOUNTER — Other Ambulatory Visit: Payer: Self-pay | Admitting: *Deleted

## 2015-05-14 MED ORDER — FLUOXETINE HCL 20 MG PO CAPS: 20.0000 mg | ORAL_CAPSULE | Freq: Every day | ORAL | Status: DC

## 2015-05-14 MED ORDER — ATORVASTATIN CALCIUM 40 MG PO TABS: 40.0000 mg | ORAL_TABLET | Freq: Every day | ORAL | Status: DC

## 2015-05-17 DIAGNOSIS — F331 Major depressive disorder, recurrent, moderate: Secondary | ICD-10-CM | POA: Diagnosis not present

## 2015-05-24 DIAGNOSIS — F331 Major depressive disorder, recurrent, moderate: Secondary | ICD-10-CM | POA: Diagnosis not present

## 2015-06-06 ENCOUNTER — Other Ambulatory Visit: Payer: Self-pay | Admitting: Internal Medicine

## 2015-06-07 DIAGNOSIS — F331 Major depressive disorder, recurrent, moderate: Secondary | ICD-10-CM | POA: Diagnosis not present

## 2015-06-10 ENCOUNTER — Other Ambulatory Visit: Payer: Self-pay | Admitting: Internal Medicine

## 2015-06-10 NOTE — Telephone Encounter (Signed)
Requesting refill for omeprazole and xanax-refill request is pending MD signature.Despina Hidden Cassady3/27/20173:47 PM

## 2015-06-10 NOTE — Telephone Encounter (Signed)
rx for xanax phoned into pharmacy.Despina Hidden Cassady3/27/20175:02 PM

## 2015-06-10 NOTE — Telephone Encounter (Signed)
NEED REFILL-PHARMACY IS-[facility] [address]

## 2015-06-10 NOTE — Telephone Encounter (Signed)
Pt called-needs refill ASAP.  Please advise.Despina Hidden Cassady3/27/20173:48 PM

## 2015-06-11 NOTE — Telephone Encounter (Signed)
done

## 2015-06-14 DIAGNOSIS — F331 Major depressive disorder, recurrent, moderate: Secondary | ICD-10-CM | POA: Diagnosis not present

## 2015-06-14 NOTE — Addendum Note (Signed)
Addended by: Truddie Crumble on: 06/14/2015 04:00 PM   Modules accepted: Orders

## 2015-07-05 DIAGNOSIS — F331 Major depressive disorder, recurrent, moderate: Secondary | ICD-10-CM | POA: Diagnosis not present

## 2015-07-05 DIAGNOSIS — F411 Generalized anxiety disorder: Secondary | ICD-10-CM | POA: Diagnosis not present

## 2015-07-08 ENCOUNTER — Other Ambulatory Visit: Payer: Self-pay | Admitting: Internal Medicine

## 2015-07-11 ENCOUNTER — Encounter: Payer: Medicare Other | Admitting: Internal Medicine

## 2015-07-11 ENCOUNTER — Encounter: Payer: Self-pay | Admitting: Internal Medicine

## 2015-07-12 DIAGNOSIS — F331 Major depressive disorder, recurrent, moderate: Secondary | ICD-10-CM | POA: Diagnosis not present

## 2015-07-12 DIAGNOSIS — F411 Generalized anxiety disorder: Secondary | ICD-10-CM | POA: Diagnosis not present

## 2015-07-15 NOTE — Telephone Encounter (Signed)
Called in to [facility] 

## 2015-07-23 NOTE — Progress Notes (Signed)
This encounter was created in error - please disregard.

## 2015-08-02 DIAGNOSIS — F411 Generalized anxiety disorder: Secondary | ICD-10-CM | POA: Diagnosis not present

## 2015-08-02 DIAGNOSIS — F331 Major depressive disorder, recurrent, moderate: Secondary | ICD-10-CM | POA: Diagnosis not present

## 2015-08-09 DIAGNOSIS — F331 Major depressive disorder, recurrent, moderate: Secondary | ICD-10-CM | POA: Diagnosis not present

## 2015-08-09 DIAGNOSIS — F411 Generalized anxiety disorder: Secondary | ICD-10-CM | POA: Diagnosis not present

## 2015-08-22 ENCOUNTER — Other Ambulatory Visit: Payer: Self-pay | Admitting: Internal Medicine

## 2015-08-23 DIAGNOSIS — F4310 Post-traumatic stress disorder, unspecified: Secondary | ICD-10-CM | POA: Diagnosis not present

## 2015-08-23 DIAGNOSIS — F331 Major depressive disorder, recurrent, moderate: Secondary | ICD-10-CM | POA: Diagnosis not present

## 2015-08-26 ENCOUNTER — Telehealth: Payer: Self-pay | Admitting: *Deleted

## 2015-08-26 NOTE — Telephone Encounter (Signed)
There is a good chance that this is viral and doesn't need ABX - only way to know is appt. We cannot Rx ABX over phone without appt

## 2015-08-26 NOTE — Telephone Encounter (Signed)
Lm for rtc 

## 2015-08-26 NOTE — Telephone Encounter (Signed)
Pt states she was exposed to "pink eye", pt does not want to come for an appt she says she looks horrible and she doe not want to expose the public to "this disease", she was reassured that it is not that easily contracted and that the doctor will probably want to see her before prescribing abx, she is persistent that she just wants "a dab" of abx and doesn't want to be seen Please advise, sending to attending and dr Marlowe Sax

## 2015-08-27 NOTE — Telephone Encounter (Signed)
I agree. Patient needs to be seen in the clinic.

## 2015-08-27 NOTE — Telephone Encounter (Signed)
No answer when called, no vmail today

## 2015-08-27 NOTE — Telephone Encounter (Signed)
No answer

## 2015-08-29 ENCOUNTER — Ambulatory Visit (INDEPENDENT_AMBULATORY_CARE_PROVIDER_SITE_OTHER): Payer: Medicare Other | Admitting: Internal Medicine

## 2015-08-29 ENCOUNTER — Encounter: Payer: Self-pay | Admitting: Internal Medicine

## 2015-08-29 VITALS — BP 149/96 | HR 103 | Temp 97.7°F

## 2015-08-29 DIAGNOSIS — B309 Viral conjunctivitis, unspecified: Secondary | ICD-10-CM

## 2015-08-29 MED ORDER — NAPHAZOLINE HCL 0.1 % OP SOLN: 1.0000 [drp] | Freq: Four times a day (QID) | OPHTHALMIC | Status: DC | PRN

## 2015-08-29 NOTE — Patient Instructions (Signed)
You are having viral conjunctivitis. This does not require antibiotic as it won't help with viruses. This can last upto 2 weeks.  If you are not getting better please come back.

## 2015-08-29 NOTE — Progress Notes (Signed)
   Subjective:    Patient ID: Shawna Hawkins, female    DOB: 12-25-1945, 70 y.o.   MRN: [id number]  HPI  70 yo f with depression, anxiety, GERd, breast cancer on anastrozole, here for pink eye.  She started having right eye redness since Saturday (6 days ago). Her granddaugter also had similar symptom, patient thinks she got it from the granddaughter. Had drainage and crusting of her eye, no vision change, no eye pain, or photophobia. No other complaints currently. Does not wear contact lenses.   Review of Systems  Constitutional: Negative for fever and chills.  HENT: Positive for ear discharge. Negative for congestion and sore throat.   Respiratory: Negative for chest tightness and shortness of breath.   Cardiovascular: Negative for chest pain.  Neurological: Negative for dizziness and numbness.       Objective:   Physical Exam  Constitutional: She is oriented to person, place, and time. She appears well-developed and well-nourished. No distress.  HENT:  Head: Normocephalic and atraumatic.  Eyes: EOM are normal. Pupils are equal, round, and reactive to light.  Erythema noted on right eye on the medial aspect of the conjunctiva. No ciliary flush. No drainage currently, has some dried crusting. No photophobia, no eye pain, no corneal opacity.   Cardiovascular: Normal rate and regular rhythm.  Exam reveals no gallop and no friction rub.   No murmur heard. Pulmonary/Chest: Effort normal and breath sounds normal. No respiratory distress. She has no wheezes.  Neurological: She is alert and oriented to person, place, and time. No cranial nerve deficit. Coordination normal.  Skin: She is not diaphoretic.    Filed Vitals:   08/29/15 1540 08/29/15 1552  BP: 149/96 149/96  Pulse:  103  Temp: 97.7 F (36.5 C)          Assessment & Plan:  See problem based a&p.

## 2015-08-29 NOTE — Assessment & Plan Note (Addendum)
Having viral conjunctivitis with some crusting, but does not seem very purulent. Likely was transmitted from her granddaughter.   Will treat supportively with Naphcon-A OTC. No abx needed at this time.  RTC if not better in 2 weeks.

## 2015-09-02 NOTE — Progress Notes (Signed)
Internal Medicine Clinic Attending  Case discussed with Dr. Ahmed at the time of the visit.  We reviewed the resident's history and exam and pertinent patient test results.  I agree with the assessment, diagnosis, and plan of care documented in the resident's note. 

## 2015-09-07 ENCOUNTER — Other Ambulatory Visit: Payer: Self-pay | Admitting: Internal Medicine

## 2015-09-12 ENCOUNTER — Telehealth: Payer: Self-pay | Admitting: Internal Medicine

## 2015-09-12 NOTE — Telephone Encounter (Signed)
Needs refill of xanax [facility] [address]

## 2015-09-12 NOTE — Telephone Encounter (Signed)
Called RX into pharmacy Notified patient

## 2015-09-16 NOTE — Telephone Encounter (Signed)
Called to pharmacy 

## 2015-10-07 ENCOUNTER — Telehealth: Payer: Self-pay | Admitting: Internal Medicine

## 2015-10-07 NOTE — Telephone Encounter (Signed)
APPT. REMINDER CALL, LMTCB °

## 2015-10-08 ENCOUNTER — Ambulatory Visit (INDEPENDENT_AMBULATORY_CARE_PROVIDER_SITE_OTHER): Payer: Medicare Other | Admitting: Internal Medicine

## 2015-10-08 ENCOUNTER — Encounter: Payer: Self-pay | Admitting: Internal Medicine

## 2015-10-08 DIAGNOSIS — IMO0001 Reserved for inherently not codable concepts without codable children: Secondary | ICD-10-CM

## 2015-10-08 DIAGNOSIS — F411 Generalized anxiety disorder: Secondary | ICD-10-CM | POA: Diagnosis not present

## 2015-10-08 DIAGNOSIS — G4700 Insomnia, unspecified: Secondary | ICD-10-CM | POA: Diagnosis present

## 2015-10-08 DIAGNOSIS — R030 Elevated blood-pressure reading, without diagnosis of hypertension: Secondary | ICD-10-CM | POA: Diagnosis not present

## 2015-10-08 DIAGNOSIS — F17210 Nicotine dependence, cigarettes, uncomplicated: Secondary | ICD-10-CM

## 2015-10-08 MED ORDER — TRAZODONE HCL 100 MG PO TABS: 100.0000 mg | ORAL_TABLET | Freq: Every day | ORAL | 2 refills | Status: DC

## 2015-10-08 MED ORDER — ALPRAZOLAM 1 MG PO TABS: 1.0000 mg | ORAL_TABLET | Freq: Two times a day (BID) | ORAL | 0 refills | Status: DC | PRN

## 2015-10-08 NOTE — Progress Notes (Signed)
   CC: Insomnia, generalized anxiety medication refill   HPI: ShawnaShawna Hawkins is a 70 y.o. with past medical history as outlined below who was due to see her PCP today for follow up of generalized anxiety disorder and HTN.  Unfortunately, PCP had an acutely ill patient present to clinic so I followed up with Shawna Hawkins and addressed her recent insomnia.   Please see problem list for status of the pt's chronic medical problems.  Past Medical History:  Diagnosis Date  . Anemia    as a young woman  . Anxiety   . Bronchitis   . COPD (chronic obstructive pulmonary disease) ([place])   . Depression   . Heat exhaustion   . Hyperlipidemia     Review of Systems:  Review of Systems  Constitutional: Positive for malaise/fatigue. Negative for fever.  Eyes: Negative for blurred vision.  Cardiovascular: Negative for chest pain and palpitations.  Gastrointestinal: Negative for abdominal pain.  Musculoskeletal: Positive for back pain. Negative for joint pain and myalgias.  Neurological: Negative for speech change and headaches.  Psychiatric/Behavioral: The patient is nervous/anxious and has insomnia.     Physical Exam:  Vitals:   10/08/15 1527  BP: (!) 175/90  Pulse: 77  Temp: 97.8 F (36.6 C)  TempSrc: Oral  Weight: 113 lb 11.2 oz (51.6 kg)   Physical Exam  Constitutional: She is oriented to person, place, and time. She appears well-developed and well-nourished.  Eyes: Conjunctivae and EOM are normal.  Cardiovascular: Normal rate and regular rhythm.   No murmur heard. Pulmonary/Chest: Effort normal and breath sounds normal. No respiratory distress. She has no wheezes. She has no rales.  Abdominal: Soft. Bowel sounds are normal. She exhibits no distension. There is no tenderness.  Neurological: She is alert and oriented to person, place, and time.  Psychiatric: Her mood appears anxious. Her speech is not tangential. She is not actively hallucinating.    Assessment & Plan:   See  Encounters Tab for problem based charting.   Patient seen with Dr. Angelia Mould

## 2015-10-08 NOTE — Assessment & Plan Note (Signed)
A: Today Ms Shawna Hawkins states her insomnia has been worse than usual.  She goes to sleep at 1am and wakes up at 5am. She does not feel that the sleep is restful and so she often takes daytime naps and drinks about 2 cups of coffee or 2 sodas in a day to keep her awake. Xanax used to help her fall asleep but she feels that it has stopped working as well and sometimes she wakes up a few hours into the evening.  She sometimes doesn't sleep for a day or two at a time but denies any racing thoughts, decreased need for sleep, increased cleaning or spending sprees at those times.  She has a history of generalized anxiety disorder and has had many stressors in her life including the death of multiple friends and her son. She also describes a son who is sick and needs her help at times. She had been seeing a therapist about every 2 weeks and felt that this helped her anxiety but she hasn't had an appointment with him in the past 6 weeks. She states she has an upcoming appointment with therapy scheduled for this Friday.    P: refilled xanax 1mg  BID  Prescribed trazadone 100 mg qd at bedtime

## 2015-10-08 NOTE — Patient Instructions (Signed)
Shawna Hawkins,  It was a pleasure to meet you today.   For your insomnia I have prescribed trazodone. Try to keep the same sleep schedule every day and avoid daytime naps. You can also try an over the counter medication- Melatonin before bedtime.

## 2015-10-08 NOTE — Assessment & Plan Note (Signed)
Shawna Hawkins has been experiencing anxiety which she feels is at her usual level. She describes many stressors in her life "everytime I look around someone else is dying" and feels she is a worry wart.   P: refilled xanax  She will F/U with PCP in 1 month

## 2015-10-08 NOTE — Assessment & Plan Note (Addendum)
A: Shawna Hawkins bp was elevated today 175/90 at the start of her visit. She states that when this happens and her bp is rechecked it has often normalized. She is not currently on medication for hypertension. On recheck her blood pressure was 172/90. It was difficult to address this visit as she was anxious about missing her bus and about to leave the clinic before her visit was over.   P: will F/U with PCP in 1 month, will recheck her bp at that time

## 2015-10-10 NOTE — Progress Notes (Signed)
Internal Medicine Clinic Attending  I saw and evaluated the patient.  I personally confirmed the key portions of the history and exam documented by Dr. Blum and I reviewed pertinent patient test results.  The assessment, diagnosis, and plan were formulated together and I agree with the documentation in the resident's note. 

## 2015-10-11 DIAGNOSIS — F411 Generalized anxiety disorder: Secondary | ICD-10-CM | POA: Diagnosis not present

## 2015-10-11 DIAGNOSIS — F331 Major depressive disorder, recurrent, moderate: Secondary | ICD-10-CM | POA: Diagnosis not present

## 2015-10-18 DIAGNOSIS — F331 Major depressive disorder, recurrent, moderate: Secondary | ICD-10-CM | POA: Diagnosis not present

## 2015-10-18 DIAGNOSIS — F411 Generalized anxiety disorder: Secondary | ICD-10-CM | POA: Diagnosis not present

## 2015-10-25 DIAGNOSIS — F411 Generalized anxiety disorder: Secondary | ICD-10-CM | POA: Diagnosis not present

## 2015-10-25 DIAGNOSIS — F331 Major depressive disorder, recurrent, moderate: Secondary | ICD-10-CM | POA: Diagnosis not present

## 2015-10-31 ENCOUNTER — Other Ambulatory Visit: Payer: Self-pay | Admitting: Internal Medicine

## 2015-11-05 ENCOUNTER — Other Ambulatory Visit: Payer: Self-pay | Admitting: Internal Medicine

## 2015-11-08 DIAGNOSIS — F411 Generalized anxiety disorder: Secondary | ICD-10-CM | POA: Diagnosis not present

## 2015-11-08 DIAGNOSIS — F331 Major depressive disorder, recurrent, moderate: Secondary | ICD-10-CM | POA: Diagnosis not present

## 2015-11-22 DIAGNOSIS — F411 Generalized anxiety disorder: Secondary | ICD-10-CM | POA: Diagnosis not present

## 2015-11-29 DIAGNOSIS — F331 Major depressive disorder, recurrent, moderate: Secondary | ICD-10-CM | POA: Diagnosis not present

## 2015-11-29 DIAGNOSIS — F411 Generalized anxiety disorder: Secondary | ICD-10-CM | POA: Diagnosis not present

## 2015-12-13 ENCOUNTER — Other Ambulatory Visit: Payer: Self-pay | Admitting: Internal Medicine

## 2015-12-13 DIAGNOSIS — F331 Major depressive disorder, recurrent, moderate: Secondary | ICD-10-CM | POA: Diagnosis not present

## 2015-12-13 DIAGNOSIS — F411 Generalized anxiety disorder: Secondary | ICD-10-CM | POA: Diagnosis not present

## 2015-12-13 NOTE — Telephone Encounter (Signed)
Unfortunately, did not see this Rx until end of business.  Will refill Monday.

## 2015-12-13 NOTE — Telephone Encounter (Signed)
Rx called into pharmacist at [facility] at [facility] and pt called and notified that Rx had been called in. Yvonna Alanis, RN, 12/13/15, 5:39P

## 2015-12-13 NOTE — Telephone Encounter (Signed)
Spoke with Mamie - -Will route this to her for filling this evening so that Ms. Gullion will not run the chance of withdrawal.  [place] narcotic database reviewed and appropriate X 6 months.

## 2015-12-27 DIAGNOSIS — F411 Generalized anxiety disorder: Secondary | ICD-10-CM | POA: Diagnosis not present

## 2015-12-27 DIAGNOSIS — F331 Major depressive disorder, recurrent, moderate: Secondary | ICD-10-CM | POA: Diagnosis not present

## 2015-12-30 ENCOUNTER — Other Ambulatory Visit: Payer: Self-pay | Admitting: Internal Medicine

## 2016-01-13 ENCOUNTER — Other Ambulatory Visit: Payer: Self-pay | Admitting: Internal Medicine

## 2016-01-15 NOTE — Telephone Encounter (Signed)
ALPRAZolam (XANAX) 1 MG tablet, refill request @ [facility] [address]

## 2016-01-16 NOTE — Telephone Encounter (Signed)
Called to pharm 

## 2016-01-24 DIAGNOSIS — F331 Major depressive disorder, recurrent, moderate: Secondary | ICD-10-CM | POA: Diagnosis not present

## 2016-01-24 DIAGNOSIS — F411 Generalized anxiety disorder: Secondary | ICD-10-CM | POA: Diagnosis not present

## 2016-01-31 ENCOUNTER — Other Ambulatory Visit: Payer: Self-pay | Admitting: Internal Medicine

## 2016-02-04 ENCOUNTER — Other Ambulatory Visit: Payer: Self-pay | Admitting: Internal Medicine

## 2016-02-05 ENCOUNTER — Other Ambulatory Visit: Payer: Self-pay | Admitting: *Deleted

## 2016-02-06 MED ORDER — TRAZODONE HCL 100 MG PO TABS: 100.0000 mg | ORAL_TABLET | Freq: Every day | ORAL | 0 refills | Status: DC

## 2016-02-06 MED ORDER — OMEPRAZOLE 40 MG PO CPDR: 40.0000 mg | DELAYED_RELEASE_CAPSULE | Freq: Every day | ORAL | 0 refills | Status: DC

## 2016-02-14 DIAGNOSIS — F411 Generalized anxiety disorder: Secondary | ICD-10-CM | POA: Diagnosis not present

## 2016-02-14 DIAGNOSIS — F331 Major depressive disorder, recurrent, moderate: Secondary | ICD-10-CM | POA: Diagnosis not present

## 2016-02-17 ENCOUNTER — Ambulatory Visit (INDEPENDENT_AMBULATORY_CARE_PROVIDER_SITE_OTHER): Payer: Medicare Other | Admitting: Internal Medicine

## 2016-02-17 VITALS — BP 139/75 | HR 84 | Temp 97.8°F | Wt 113.2 lb

## 2016-02-17 DIAGNOSIS — F17210 Nicotine dependence, cigarettes, uncomplicated: Secondary | ICD-10-CM | POA: Diagnosis not present

## 2016-02-17 DIAGNOSIS — G4700 Insomnia, unspecified: Secondary | ICD-10-CM | POA: Diagnosis not present

## 2016-02-17 DIAGNOSIS — B9789 Other viral agents as the cause of diseases classified elsewhere: Secondary | ICD-10-CM | POA: Diagnosis not present

## 2016-02-17 DIAGNOSIS — J329 Chronic sinusitis, unspecified: Secondary | ICD-10-CM

## 2016-02-17 DIAGNOSIS — H9203 Otalgia, bilateral: Secondary | ICD-10-CM | POA: Diagnosis not present

## 2016-02-17 DIAGNOSIS — F411 Generalized anxiety disorder: Secondary | ICD-10-CM | POA: Diagnosis not present

## 2016-02-17 DIAGNOSIS — Z79899 Other long term (current) drug therapy: Secondary | ICD-10-CM

## 2016-02-17 DIAGNOSIS — K219 Gastro-esophageal reflux disease without esophagitis: Secondary | ICD-10-CM | POA: Diagnosis not present

## 2016-02-17 DIAGNOSIS — J328 Other chronic sinusitis: Secondary | ICD-10-CM | POA: Diagnosis not present

## 2016-02-17 MED ORDER — FLUTICASONE PROPIONATE 50 MCG/ACT NA SUSP: 1.0000 | Freq: Every day | NASAL | 2 refills | Status: DC

## 2016-02-17 MED ORDER — DOXYLAMINE SUCCINATE (SLEEP) 25 MG PO TABS: 25.0000 mg | ORAL_TABLET | Freq: Every evening | ORAL | 3 refills | Status: DC | PRN

## 2016-02-17 MED ORDER — OMEPRAZOLE 40 MG PO CPDR: 40.0000 mg | DELAYED_RELEASE_CAPSULE | Freq: Every day | ORAL | 0 refills | Status: DC

## 2016-02-17 MED ORDER — ALPRAZOLAM 1 MG PO TABS: 1.0000 mg | ORAL_TABLET | Freq: Two times a day (BID) | ORAL | 0 refills | Status: DC | PRN

## 2016-02-17 MED ORDER — ALPRAZOLAM 1 MG PO TABS: 1.0000 mg | ORAL_TABLET | Freq: Two times a day (BID) | ORAL | 3 refills | Status: DC | PRN

## 2016-02-17 NOTE — Assessment & Plan Note (Addendum)
Symptoms stable. Refilled Prilosec.

## 2016-02-17 NOTE — Progress Notes (Signed)
   CC: Insomnia  HPI:  [name] is a 70 y.o. with past medical history as outlined below who presents to clinic for insomnia. She's also requesting medication refills for her Xanax and reports difficulty getting her refills on time.  She has had bilateral ear pain for 7 to 10 days. Denies decreased hearing. She states that ear pain has improved although her right ear is still worse in her left. Denies fevers, night sweats, and chills.  Also requesting a letter from family services the [place] to be scanned into her chart noting her diagnosis of ADHD, PTSD, and major depressive disorder.   Past Medical History:  Diagnosis Date  . Anemia    as a young woman  . Anxiety   . Bronchitis   . COPD (chronic obstructive pulmonary disease) ([place])   . Depression   . Heat exhaustion   . Hyperlipidemia     Review of Systems:  Pertinent review of systems as in history of present illness otherwise the rest of review of systems is negative   Physical Exam:  Vitals:   02/17/16 1408  BP: 139/75  Pulse: 84  Temp: 97.8 F (36.6 C)  SpO2: 100%  Weight: 113 lb 3.2 oz (51.3 kg)   Physical Exam  Constitutional: She appears well-developed and well-nourished. No distress.  HENT:  Head: Normocephalic and atraumatic.  Right Ear: External ear normal.  Left Ear: External ear normal.  Clear tympanic membranes bilaterally. Tender to percussion of bilateral maxillary sinuses.  Cardiovascular: Normal rate, regular rhythm and normal heart sounds.  Exam reveals no gallop and no friction rub.   No murmur heard. Pulmonary/Chest: Effort normal and breath sounds normal. No respiratory distress. She has no wheezes. She has no rales.  Abdominal: Soft. Bowel sounds are normal. She exhibits no distension and no mass. There is no tenderness. There is no rebound and no guarding.  Skin: Skin is warm and dry. No rash noted. She is not diaphoretic. No erythema. No pallor.    Assessment & Plan:   See  Encounters Tab for problem based charting.  Patient discussed with Dr. Evette Doffing

## 2016-02-17 NOTE — Patient Instructions (Addendum)
You can take unisom at night to help with sleep. Start using flonase in each nare to help with your sinusitis.    Sinusitis, Adult Sinusitis is soreness and inflammation of your sinuses. Sinuses are hollow spaces in the bones around your face. They are located:  Around your eyes.  In the middle of your forehead.  Behind your nose.  In your cheekbones. Your sinuses and nasal passages are lined with a stringy fluid (mucus). Mucus normally drains out of your sinuses. When your nasal tissues get inflamed or swollen, the mucus can get trapped or blocked so air cannot flow through your sinuses. This lets bacteria, viruses, and funguses grow, and that leads to infection. Follow these instructions at home: Medicines  Take, use, or apply over-the-counter and prescription medicines only as told by your doctor. These may include nasal sprays.  If you were prescribed an antibiotic medicine, take it as told by your doctor. Do not stop taking the antibiotic even if you start to feel better. Hydrate and Humidify  Drink enough water to keep your pee (urine) clear or pale yellow.  Use a cool mist humidifier to keep the humidity level in your home above 50%.  Breathe in steam for 10-15 minutes, 3-4 times a day or as told by your doctor. You can do this in the bathroom while a hot shower is running.  Try not to spend time in cool or dry air. Rest  Rest as much as possible.  Sleep with your head raised (elevated).  Make sure to get enough sleep each night. General instructions  Put a warm, moist washcloth on your face 3-4 times a day or as told by your doctor. This will help with discomfort.  Wash your hands often with soap and water. If there is no soap and water, use hand sanitizer.  Do not smoke. Avoid being around people who are smoking (secondhand smoke).  Keep all follow-up visits as told by your doctor. This is important. Contact a doctor if:  You have a fever.  Your symptoms get  worse.  Your symptoms do not get better within 10 days. Get help right away if:  You have a very bad headache.  You cannot stop throwing up (vomiting).  You have pain or swelling around your face or eyes.  You have trouble seeing.  You feel confused.  Your neck is stiff.  You have trouble breathing. This information is not intended to replace advice given to you by your health care provider. Make sure you discuss any questions you have with your health care provider. Document Released: 08/19/2007 Document Revised: 10/27/2015 Document Reviewed: 12/26/2014 Elsevier Interactive Patient Education  2017 [employer].

## 2016-02-17 NOTE — Assessment & Plan Note (Signed)
Assessment: Patient previously given trazodone to help with insomnia. She states that trazodone made her nervous and she could not tolerate it. She has difficulty going to sleep and staying asleep.  Plan: Given Rx for Unisom.

## 2016-02-17 NOTE — Assessment & Plan Note (Signed)
Patient requesting refill of her Xanax 1 mg twice a day. Her PCP is not available until February 13. Her symptoms are stable. [place] and drug database checked and there were no red flags. Will give her a 3 month refill of Xanax 1 mg twice a day when necessary. informed her that she must keep her appointment with her PCP and that Xanax will not be refilled over the phone.

## 2016-02-17 NOTE — Assessment & Plan Note (Signed)
Assessment: Patient has had bilateral ear pain for the past 7-10 days. Her ear pain has improved although her right ear still hurts more than her left. denies any decreased hearing. She was significantly tender to palpation of her maxillary sinuses bilaterally. She had clear tympanic membranes b/l.   Plan: given rx for flonase and instructed on nasal hygiene.

## 2016-02-18 NOTE — Progress Notes (Signed)
Internal Medicine Clinic Attending  Case discussed with Dr. Truong at the time of the visit.  We reviewed the resident's history and exam and pertinent patient test results.  I agree with the assessment, diagnosis, and plan of care documented in the resident's note.  

## 2016-03-06 ENCOUNTER — Other Ambulatory Visit: Payer: Self-pay | Admitting: *Deleted

## 2016-03-06 DIAGNOSIS — F411 Generalized anxiety disorder: Secondary | ICD-10-CM | POA: Diagnosis not present

## 2016-03-06 DIAGNOSIS — F331 Major depressive disorder, recurrent, moderate: Secondary | ICD-10-CM | POA: Diagnosis not present

## 2016-03-08 MED ORDER — ATORVASTATIN CALCIUM 40 MG PO TABS
40.0000 mg | ORAL_TABLET | Freq: Every day | ORAL | 0 refills | Status: DC
Start: 2016-03-08 — End: 2016-06-03

## 2016-03-13 ENCOUNTER — Other Ambulatory Visit: Payer: Self-pay | Admitting: Internal Medicine

## 2016-03-20 DIAGNOSIS — F331 Major depressive disorder, recurrent, moderate: Secondary | ICD-10-CM | POA: Diagnosis not present

## 2016-03-20 DIAGNOSIS — F411 Generalized anxiety disorder: Secondary | ICD-10-CM | POA: Diagnosis not present

## 2016-04-03 DIAGNOSIS — F331 Major depressive disorder, recurrent, moderate: Secondary | ICD-10-CM | POA: Diagnosis not present

## 2016-04-03 DIAGNOSIS — F411 Generalized anxiety disorder: Secondary | ICD-10-CM | POA: Diagnosis not present

## 2016-04-08 ENCOUNTER — Telehealth: Payer: Self-pay | Admitting: Internal Medicine

## 2016-04-08 NOTE — Telephone Encounter (Signed)
Patient has dropped off a letter along with a [employer] along with a letter her Counselor has submitted for her to be excused.  Patient states it must be done and completed and returned to the courthouse  no later than Apr 16, 2016 to  to be considered.  It has been placed in your box for review.  Please advise.

## 2016-04-10 NOTE — Telephone Encounter (Signed)
Hi Chilon,  I looked at the documents in my box. There was a letter from Curley Spice (clinic social worker) stating patient has mental health problems. I will need a letter from the patient's psychologist or psychiatrist stating what mental health problems she has, severity of her problems, and how it prevents her from going to jury duty. Until then, I am not able to provide any letter.  Thanks,  Vasu

## 2016-04-11 ENCOUNTER — Encounter: Payer: Self-pay | Admitting: Internal Medicine

## 2016-04-11 DIAGNOSIS — F4310 Post-traumatic stress disorder, unspecified: Secondary | ICD-10-CM | POA: Insufficient documentation

## 2016-04-11 DIAGNOSIS — F410 Panic disorder [episodic paroxysmal anxiety] without agoraphobia: Secondary | ICD-10-CM

## 2016-04-11 DIAGNOSIS — F909 Attention-deficit hyperactivity disorder, unspecified type: Secondary | ICD-10-CM | POA: Insufficient documentation

## 2016-04-30 ENCOUNTER — Other Ambulatory Visit: Payer: Self-pay | Admitting: Internal Medicine

## 2016-05-01 DIAGNOSIS — F331 Major depressive disorder, recurrent, moderate: Secondary | ICD-10-CM | POA: Diagnosis not present

## 2016-05-01 DIAGNOSIS — F411 Generalized anxiety disorder: Secondary | ICD-10-CM | POA: Diagnosis not present

## 2016-05-05 ENCOUNTER — Ambulatory Visit (INDEPENDENT_AMBULATORY_CARE_PROVIDER_SITE_OTHER): Payer: Medicare Other | Admitting: Internal Medicine

## 2016-05-05 ENCOUNTER — Encounter: Payer: Self-pay | Admitting: Internal Medicine

## 2016-05-05 VITALS — BP 130/78 | HR 90 | Temp 97.5°F | Ht 63.0 in | Wt 109.6 lb

## 2016-05-05 DIAGNOSIS — M8580 Other specified disorders of bone density and structure, unspecified site: Secondary | ICD-10-CM

## 2016-05-05 DIAGNOSIS — F17210 Nicotine dependence, cigarettes, uncomplicated: Secondary | ICD-10-CM | POA: Diagnosis not present

## 2016-05-05 DIAGNOSIS — Z716 Tobacco abuse counseling: Secondary | ICD-10-CM | POA: Diagnosis not present

## 2016-05-05 DIAGNOSIS — Z79899 Other long term (current) drug therapy: Secondary | ICD-10-CM | POA: Diagnosis not present

## 2016-05-05 DIAGNOSIS — F411 Generalized anxiety disorder: Secondary | ICD-10-CM

## 2016-05-05 DIAGNOSIS — M8588 Other specified disorders of bone density and structure, other site: Secondary | ICD-10-CM | POA: Diagnosis present

## 2016-05-05 DIAGNOSIS — Z0000 Encounter for general adult medical examination without abnormal findings: Secondary | ICD-10-CM

## 2016-05-05 DIAGNOSIS — C50311 Malignant neoplasm of lower-inner quadrant of right female breast: Secondary | ICD-10-CM | POA: Diagnosis not present

## 2016-05-05 DIAGNOSIS — F17200 Nicotine dependence, unspecified, uncomplicated: Secondary | ICD-10-CM

## 2016-05-05 MED ORDER — ALPRAZOLAM 1 MG PO TABS: 1.0000 mg | ORAL_TABLET | Freq: Two times a day (BID) | ORAL | 2 refills | Status: DC | PRN

## 2016-05-05 NOTE — Patient Instructions (Addendum)
Shawna Hawkins it was nice seeing you today.  -You may want to read about Zoledronic acid for your osteopenia.

## 2016-05-06 NOTE — Assessment & Plan Note (Addendum)
Pt is requesting a refill on Xanax. States this medication helps control her anxiety. Review of [place] Controlled Substance Database does not reveal any red flags. Per patient, she took the last dose of this medication at 11 am on 05/05/2016.  -Xanax 1 mg BID prn has been refilled. Note to pharmacy indicating not to fill until 06/17/2016 as she still has a refill left on her old prescription.  -Random UDS

## 2016-05-06 NOTE — Progress Notes (Signed)
   CC: Patient is here to discuss osteopenia and tobacco use.   HPI:  [name] is a 71 y.o. F with a PMHx of conditions listed below presenting to the clinic to discuss osteopenia and tobacco use. This visit also focused on preventative healthcare. Please see problem based charting for the status of the patient's current and chronic medical conditions.   Past Medical History:  Diagnosis Date  . Anemia    as a young woman  . Anxiety   . Bronchitis   . COPD (chronic obstructive pulmonary disease) ([place])   . Depression   . Heat exhaustion   . Hyperlipidemia     Review of Systems:  Pertinent positives mentioned in HPI. Remainder of all ROS negative.   Physical Exam: Physical Exam  Constitutional: She is oriented to person, place, and time. No distress.  HENT:  Head: Normocephalic and atraumatic.  Eyes: EOM are normal.  Neck: Neck supple. No tracheal deviation present.  Cardiovascular: Normal rate, regular rhythm and intact distal pulses.  Exam reveals no gallop and no friction rub.   Pulmonary/Chest: Effort normal and breath sounds normal. No respiratory distress. She has no wheezes. She has no rales.  Abdominal: Soft. Bowel sounds are normal. She exhibits no distension. There is no tenderness.  Musculoskeletal: Normal range of motion. She exhibits no edema.  Neurological: She is alert and oriented to person, place, and time.  Skin: Skin is warm and dry.  Psychiatric:  Pressured speech    Vitals:   05/05/16 1332  BP: 130/78  Pulse: 90  Temp: 97.5 F (36.4 C)  TempSrc: Oral  SpO2: 99%  Weight: 109 lb 9.6 oz (49.7 kg)  Height: 5\' 3"  (1.6 m)    Assessment & Plan:   See Encounters Tab for problem based charting.  Patient discussed with Dr. Evette Doffing

## 2016-05-06 NOTE — Assessment & Plan Note (Addendum)
Pt denied referral for screening colonoscopy. She did agree to taking stool cards home.  She declined a referral for a mammogram again at this visit.

## 2016-05-06 NOTE — Assessment & Plan Note (Signed)
I offered a mammogram again at this visit and patient declined.

## 2016-05-06 NOTE — Assessment & Plan Note (Addendum)
A Pt reports smoking 1/2 PPD at present; was smoking 2-3 PPD previously since her early 68s and quit intermittently a few times. I discussed smoking cessation and she seems to be in the pre-contemplative stage at this time.  P -Discuss again at next visit

## 2016-05-06 NOTE — Assessment & Plan Note (Addendum)
A Patient stopped taking Fosamax in 2016. She is not interested in taking this medication as it caused her to feel nauseous. Dexa done 12/2011 showing BMD 0.710 g/cm2 at the L femoral neck with T score -2.4. I discussed doing another Dexa scan but she declined. Also discussed zoledronic acid and patient stated she needs time to read about this medication at home.   P -Discuss again at next visit

## 2016-05-07 NOTE — Progress Notes (Signed)
Internal Medicine Clinic Attending  Case discussed with Dr. Rathoreat the time of the visit. We reviewed the resident's history and exam and pertinent patient test results. I agree with the assessment, diagnosis, and plan of care documented in the resident's note.  

## 2016-05-12 LAB — TOXASSURE SELECT,+ANTIDEPR,UR

## 2016-05-15 DIAGNOSIS — F411 Generalized anxiety disorder: Secondary | ICD-10-CM | POA: Diagnosis not present

## 2016-05-15 DIAGNOSIS — F331 Major depressive disorder, recurrent, moderate: Secondary | ICD-10-CM | POA: Diagnosis not present

## 2016-06-03 ENCOUNTER — Other Ambulatory Visit: Payer: Self-pay | Admitting: Internal Medicine

## 2016-06-13 ENCOUNTER — Other Ambulatory Visit: Payer: Self-pay | Admitting: Internal Medicine

## 2016-06-26 DIAGNOSIS — F331 Major depressive disorder, recurrent, moderate: Secondary | ICD-10-CM | POA: Diagnosis not present

## 2016-07-03 DIAGNOSIS — F411 Generalized anxiety disorder: Secondary | ICD-10-CM | POA: Diagnosis not present

## 2016-07-03 DIAGNOSIS — F331 Major depressive disorder, recurrent, moderate: Secondary | ICD-10-CM | POA: Diagnosis not present

## 2016-07-15 ENCOUNTER — Encounter: Payer: Self-pay | Admitting: Internal Medicine

## 2016-07-16 ENCOUNTER — Encounter: Payer: Self-pay | Admitting: Internal Medicine

## 2016-07-17 DIAGNOSIS — F331 Major depressive disorder, recurrent, moderate: Secondary | ICD-10-CM | POA: Diagnosis not present

## 2016-07-17 DIAGNOSIS — F411 Generalized anxiety disorder: Secondary | ICD-10-CM | POA: Diagnosis not present

## 2016-08-01 ENCOUNTER — Other Ambulatory Visit: Payer: Self-pay | Admitting: Internal Medicine

## 2016-08-01 DIAGNOSIS — B9789 Other viral agents as the cause of diseases classified elsewhere: Secondary | ICD-10-CM

## 2016-08-01 DIAGNOSIS — J329 Chronic sinusitis, unspecified: Principal | ICD-10-CM

## 2016-08-31 ENCOUNTER — Other Ambulatory Visit: Payer: Self-pay | Admitting: Internal Medicine

## 2016-09-11 ENCOUNTER — Other Ambulatory Visit: Payer: Self-pay | Admitting: Internal Medicine

## 2016-09-17 DIAGNOSIS — F4310 Post-traumatic stress disorder, unspecified: Secondary | ICD-10-CM | POA: Diagnosis not present

## 2016-09-17 DIAGNOSIS — F411 Generalized anxiety disorder: Secondary | ICD-10-CM | POA: Diagnosis not present

## 2016-09-17 DIAGNOSIS — F331 Major depressive disorder, recurrent, moderate: Secondary | ICD-10-CM | POA: Diagnosis not present

## 2016-09-19 ENCOUNTER — Other Ambulatory Visit: Payer: Self-pay | Admitting: Internal Medicine

## 2016-09-19 DIAGNOSIS — F411 Generalized anxiety disorder: Secondary | ICD-10-CM

## 2016-09-22 NOTE — Telephone Encounter (Signed)
Called to pharm 

## 2016-10-23 ENCOUNTER — Other Ambulatory Visit: Payer: Self-pay | Admitting: Internal Medicine

## 2016-10-23 DIAGNOSIS — F411 Generalized anxiety disorder: Secondary | ICD-10-CM

## 2016-10-27 NOTE — Telephone Encounter (Signed)
Called to pharm also faxed

## 2016-11-03 ENCOUNTER — Encounter: Payer: Self-pay | Admitting: Internal Medicine

## 2016-11-04 DIAGNOSIS — F331 Major depressive disorder, recurrent, moderate: Secondary | ICD-10-CM | POA: Diagnosis not present

## 2016-11-04 DIAGNOSIS — F411 Generalized anxiety disorder: Secondary | ICD-10-CM | POA: Diagnosis not present

## 2016-11-04 DIAGNOSIS — F4310 Post-traumatic stress disorder, unspecified: Secondary | ICD-10-CM | POA: Diagnosis not present

## 2016-11-18 DIAGNOSIS — F331 Major depressive disorder, recurrent, moderate: Secondary | ICD-10-CM | POA: Diagnosis not present

## 2016-11-18 DIAGNOSIS — F411 Generalized anxiety disorder: Secondary | ICD-10-CM | POA: Diagnosis not present

## 2016-11-18 DIAGNOSIS — F4310 Post-traumatic stress disorder, unspecified: Secondary | ICD-10-CM | POA: Diagnosis not present

## 2016-11-24 ENCOUNTER — Encounter: Payer: Self-pay | Admitting: Internal Medicine

## 2016-11-24 ENCOUNTER — Ambulatory Visit (INDEPENDENT_AMBULATORY_CARE_PROVIDER_SITE_OTHER): Payer: Medicare Other | Admitting: Internal Medicine

## 2016-11-24 VITALS — BP 147/87 | HR 89 | Temp 97.8°F | Ht 63.0 in | Wt 103.7 lb

## 2016-11-24 DIAGNOSIS — Z79899 Other long term (current) drug therapy: Secondary | ICD-10-CM | POA: Diagnosis not present

## 2016-11-24 DIAGNOSIS — Z0000 Encounter for general adult medical examination without abnormal findings: Secondary | ICD-10-CM

## 2016-11-24 DIAGNOSIS — F411 Generalized anxiety disorder: Secondary | ICD-10-CM | POA: Diagnosis not present

## 2016-11-24 DIAGNOSIS — F17210 Nicotine dependence, cigarettes, uncomplicated: Secondary | ICD-10-CM | POA: Diagnosis not present

## 2016-11-24 DIAGNOSIS — F17200 Nicotine dependence, unspecified, uncomplicated: Secondary | ICD-10-CM

## 2016-11-24 DIAGNOSIS — R030 Elevated blood-pressure reading, without diagnosis of hypertension: Secondary | ICD-10-CM | POA: Diagnosis not present

## 2016-11-24 DIAGNOSIS — Z853 Personal history of malignant neoplasm of breast: Secondary | ICD-10-CM | POA: Diagnosis not present

## 2016-11-24 DIAGNOSIS — E785 Hyperlipidemia, unspecified: Secondary | ICD-10-CM

## 2016-11-24 MED ORDER — ALPRAZOLAM 1 MG PO TABS: ORAL_TABLET | ORAL | 0 refills | Status: DC

## 2016-11-24 NOTE — Assessment & Plan Note (Signed)
She is chronically on Xanax 1 mg twice daily. Also taking Prozac 20 mg daily. Patient believes Prozac has been giving her diarrhea, and as such, she has not been taking it regularly. Reports having "explosive" non-bloody diarrhea for the past few months associated with weight loss. Denies having any abdominal pain. States her diarrhea has caused her to soil her clothes on several occasions. Diarrhea stopped 2 weeks ago. Patient was in a hurry to leave and just wanted her Xanax refilled. Diarrhea could not be discussed in detail. When I mentioned weaning Xanax and instead switching her to another SSRI, patient became upset stating she needs her current dose of Xanax. Stated she did not have time to talk to me and had to leave. Patient did not sit down during the entire visit. She appeared agitated and restless. She was standing with her purse next to the room door during the entire visit.  Plan -I spoke to her pharmacy and they informed me that Xanax was last dispensed on 10/28/2016 for a 30 day supply. I provided the patient with a prescription for 1 month supply of Xanax at this visit. Advised her to return to the clinic in 1 month to discuss her anxiety and other medical problems.Explained to her that no further refills will be provided on Xanax until she follows up at the clinic. -Continue Prozac. Her diarrhea could possibly be related to a GI issue and has now resolved.

## 2016-11-24 NOTE — Assessment & Plan Note (Addendum)
Patient continues to have an elevated blood pressure reading at this visit (147/87). She appeared agitated and restless. It was difficult to discuss most of her health problems as patient was in a hurry to leave. Her blood pressure could be elevated in the setting of anxiety.  Plan -Will hold off starting her on an antihypertensive medication as she also mentioned having recent explosive diarrhea

## 2016-11-24 NOTE — Assessment & Plan Note (Signed)
Patient declines screening mammogram.

## 2016-11-24 NOTE — Assessment & Plan Note (Signed)
Could not be discussed at this visit as patient was in a hurry to leave.

## 2016-11-24 NOTE — Progress Notes (Signed)
   CC: Patient is requesting a refill on Xanax.  HPI:  [name] is a 71 y.o. female with a past medical history of conditions listed below presenting to the clinic requesting a refill on xanax. She was in a hurry to leave and did not want to discuss other medical problems. Please see problem based charting for the status of the patient's current and chronic medical conditions.   Past Medical History:  Diagnosis Date  . Anemia    as a young woman  . Anxiety   . Bronchitis   . COPD (chronic obstructive pulmonary disease) ([place])   . Depression   . Heat exhaustion   . Hyperlipidemia    Review of Systems: Pertinent positives mentioned in HPI. No further review of systems could be obtained due to lack of patient cooperation.  Physical Exam:  Vitals:   11/24/16 1520  BP: (!) 147/87  Pulse: 89  Temp: 97.8 F (36.6 C)  TempSrc: Oral  SpO2: 97%  Weight: 103 lb 11.2 oz (47 kg)  Height: 5\' 3"  (1.6 m)   Deferred due to lack of patient cooperation. She was standing up with her purse on her shoulder next to the room door during the entire visit. Appeared agitated and restless.  Assessment & Plan:   See Encounters Tab for problem based charting.  Patient discussed with Dr. Dareen Piano

## 2016-11-24 NOTE — Assessment & Plan Note (Signed)
Patient declines influenza vaccine and colonoscopy.

## 2016-11-24 NOTE — Assessment & Plan Note (Signed)
Hyperlipidemia could not be discussed at this visit as patient was ready to leave.

## 2016-11-26 NOTE — Progress Notes (Signed)
Internal Medicine Clinic Attending  Case discussed with Dr. Rathoreat the time of the visit. We reviewed the resident's history and exam and pertinent patient test results. I agree with the assessment, diagnosis, and plan of care documented in the resident's note.  

## 2016-11-28 ENCOUNTER — Other Ambulatory Visit: Payer: Self-pay | Admitting: Internal Medicine

## 2016-12-02 ENCOUNTER — Other Ambulatory Visit: Payer: Self-pay | Admitting: Internal Medicine

## 2016-12-02 DIAGNOSIS — F331 Major depressive disorder, recurrent, moderate: Secondary | ICD-10-CM | POA: Diagnosis not present

## 2016-12-02 DIAGNOSIS — F411 Generalized anxiety disorder: Secondary | ICD-10-CM | POA: Diagnosis not present

## 2016-12-02 DIAGNOSIS — F4310 Post-traumatic stress disorder, unspecified: Secondary | ICD-10-CM | POA: Diagnosis not present

## 2016-12-16 DIAGNOSIS — F331 Major depressive disorder, recurrent, moderate: Secondary | ICD-10-CM | POA: Diagnosis not present

## 2016-12-16 DIAGNOSIS — F4310 Post-traumatic stress disorder, unspecified: Secondary | ICD-10-CM | POA: Diagnosis not present

## 2016-12-26 ENCOUNTER — Other Ambulatory Visit: Payer: Self-pay | Admitting: Internal Medicine

## 2016-12-26 ENCOUNTER — Encounter: Payer: Self-pay | Admitting: Internal Medicine

## 2016-12-26 DIAGNOSIS — F411 Generalized anxiety disorder: Secondary | ICD-10-CM

## 2016-12-27 ENCOUNTER — Telehealth: Payer: Self-pay | Admitting: Internal Medicine

## 2016-12-27 DIAGNOSIS — F411 Generalized anxiety disorder: Secondary | ICD-10-CM

## 2016-12-28 NOTE — Telephone Encounter (Signed)
Duplicate request denied...orignal request pending MD review.Despina Hidden Cassady10/15/201811:25 AM

## 2016-12-29 NOTE — Telephone Encounter (Signed)
Please read my last clinic visit note. Patient was given 30 days to follow-up in the clinic but she has not been back. She needs to be seen in the clinic as soon as possible. No further refills will be provided at this time.

## 2016-12-30 NOTE — Telephone Encounter (Signed)
Patient sent mychart message informing her that she needs to get in contact with [place] to make an appointment.

## 2017-01-29 ENCOUNTER — Other Ambulatory Visit: Payer: Self-pay | Admitting: Internal Medicine

## 2017-02-09 DIAGNOSIS — F331 Major depressive disorder, recurrent, moderate: Secondary | ICD-10-CM | POA: Diagnosis not present

## 2017-02-25 DIAGNOSIS — F331 Major depressive disorder, recurrent, moderate: Secondary | ICD-10-CM | POA: Diagnosis not present

## 2017-04-20 DIAGNOSIS — F331 Major depressive disorder, recurrent, moderate: Secondary | ICD-10-CM | POA: Diagnosis not present

## 2017-04-20 DIAGNOSIS — F4310 Post-traumatic stress disorder, unspecified: Secondary | ICD-10-CM | POA: Diagnosis not present

## 2017-04-22 ENCOUNTER — Other Ambulatory Visit: Payer: Self-pay

## 2017-04-22 ENCOUNTER — Ambulatory Visit (INDEPENDENT_AMBULATORY_CARE_PROVIDER_SITE_OTHER): Payer: Medicare Other | Admitting: Internal Medicine

## 2017-04-22 ENCOUNTER — Encounter: Payer: Self-pay | Admitting: Internal Medicine

## 2017-04-22 VITALS — BP 115/87 | HR 93 | Temp 98.4°F | Ht 63.0 in | Wt 106.9 lb

## 2017-04-22 DIAGNOSIS — F411 Generalized anxiety disorder: Secondary | ICD-10-CM

## 2017-04-22 DIAGNOSIS — K219 Gastro-esophageal reflux disease without esophagitis: Secondary | ICD-10-CM

## 2017-04-22 DIAGNOSIS — J3089 Other allergic rhinitis: Secondary | ICD-10-CM

## 2017-04-22 DIAGNOSIS — J309 Allergic rhinitis, unspecified: Secondary | ICD-10-CM | POA: Insufficient documentation

## 2017-04-22 DIAGNOSIS — E785 Hyperlipidemia, unspecified: Secondary | ICD-10-CM

## 2017-04-22 MED ORDER — TRIAMCINOLONE ACETONIDE 55 MCG/ACT NA AERO: 2.0000 | INHALATION_SPRAY | Freq: Every day | NASAL | 2 refills | Status: AC

## 2017-04-22 MED ORDER — OMEPRAZOLE 40 MG PO CPDR: 40.0000 mg | DELAYED_RELEASE_CAPSULE | Freq: Every day | ORAL | 0 refills | Status: AC

## 2017-04-22 MED ORDER — ATORVASTATIN CALCIUM 40 MG PO TABS: 40.0000 mg | ORAL_TABLET | Freq: Every day | ORAL | 0 refills | Status: DC

## 2017-04-22 MED ORDER — FLUOXETINE HCL 20 MG PO CAPS: 20.0000 mg | ORAL_CAPSULE | Freq: Every day | ORAL | 0 refills | Status: DC

## 2017-04-22 MED ORDER — ALPRAZOLAM 1 MG PO TABS: ORAL_TABLET | ORAL | 2 refills | Status: DC

## 2017-04-22 NOTE — Progress Notes (Signed)
   CC: Anxiety  HPI:  [name] is a 72 y.o. female with a past medical history of conditions listed below presenting to the clinic requesting refills on her medications.  She continues to complain of anxiety.  GERD, hypertension, and allergic rhinitis were also discussed. Please see problem based charting for the status of the patient's current and chronic medical conditions.   Past Medical History:  Diagnosis Date  . Anemia    as a young woman  . Anxiety   . Bronchitis   . COPD (chronic obstructive pulmonary disease) ([place])   . Depression   . Heat exhaustion   . Hyperlipidemia    Review of Systems: Pertinent positives mentioned in HPI. Remainder of all ROS negative.   Physical Exam:  Vitals:   04/22/17 1512  BP: 115/87  Pulse: 93  Temp: 98.4 F (36.9 C)  TempSrc: Oral  SpO2: 98%  Weight: 106 lb 14.4 oz (48.5 kg)  Height: 5\' 3"  (1.6 m)   Physical Exam  Constitutional: She is oriented to person, place, and time. She appears well-developed and well-nourished. No distress.  HENT:  Head: Normocephalic and atraumatic.  Eyes: Right eye exhibits no discharge. Left eye exhibits no discharge.  Cardiovascular: Normal rate, regular rhythm and intact distal pulses.  Pulmonary/Chest: Effort normal and breath sounds normal.  Abdominal: Soft. Bowel sounds are normal. She exhibits no distension. There is no tenderness.  Musculoskeletal: She exhibits no edema.  Neurological: She is alert and oriented to person, place, and time.  Skin: Skin is warm and dry.    Assessment & Plan:   See Encounters Tab for problem based charting.  Patient discussed with Dr. Angelia Mould

## 2017-04-22 NOTE — Assessment & Plan Note (Signed)
Patient continues to complain of anxiety at this visit.  She is also currently grieving the recent death of her son.  States her youngest son passed away 85 years ago and her older son passed away in 03/05/2017. Discussed increasing the dose of Prozac but patient refused stating it has caused diarrhea in the past.  She wishes to continue her current dose.  Per review of [place] controlled substance database, a 30-day supply of Xanax was last dispensed on November 25, 2016.  Patient states she has been using the medication sparingly since then.  Discussed titrating the dose down as she is now able to tolerate lower doses and patient agreed.  Plan -Xanax 1 mg daily, #45 for a 30-day supply with 2 refills.  Advised patient to take 1 mg twice daily on days she feels more anxious.  Explained to her that she needs to return to the clinic in 3 months before any further refills. -Continue Prozac 20 mg daily -GAD-7 and PHQ-9 at next visit

## 2017-04-22 NOTE — Assessment & Plan Note (Signed)
Patient reports having chronic rhinorrhea and sneezing.  States she was diagnosed with environmental allergies in the past.  States she has previously tried [facility] but it made her rhinorrhea worse.  She is currently taking Benadryl as needed.  Plan -Continue Benadryl as needed -Nasacort nasal spray

## 2017-04-22 NOTE — Assessment & Plan Note (Signed)
Patient reports noncompliance with omeprazole.  She continues to complain of GERD symptoms 3 times a week.  Plan -Continue omeprazole 40 mg daily -Emphasized the importance of medication compliance.

## 2017-04-22 NOTE — Assessment & Plan Note (Signed)
This problem is chronic and stable.  Lipitor refilled at this visit.

## 2017-04-23 ENCOUNTER — Telehealth: Payer: Self-pay | Admitting: Internal Medicine

## 2017-04-23 DIAGNOSIS — F411 Generalized anxiety disorder: Secondary | ICD-10-CM

## 2017-04-23 NOTE — Telephone Encounter (Signed)
Patient called in this morning requesting that the nurse and/or doctor call to clarify the dosage of her ALPRAZolam Shawna Hawkins) 1 MG tablet at  [place], [place] [phone] (Phone) [phone] (Fax)

## 2017-04-23 NOTE — Progress Notes (Signed)
Internal Medicine Clinic Attending  Case discussed with Dr. Rathoreat the time of the visit. We reviewed the resident's history and exam and pertinent patient test results. I agree with the assessment, diagnosis, and plan of care documented in the resident's note.  

## 2017-04-23 NOTE — Telephone Encounter (Signed)
Pt was told by the pharmacy for her Xanax - the way it is written "take one tab daily as needed" , her insurance will only pay for 30 tabs not for 45 as prescribed. And the pharmacy would not let her pay for the extra 15 tablets. Please send in new rx Thanks

## 2017-04-27 MED ORDER — ALPRAZOLAM 1 MG PO TABS: ORAL_TABLET | ORAL | 2 refills | Status: AC

## 2017-04-27 NOTE — Telephone Encounter (Signed)
New rx phoned into the pharmacy per MD request-rx phoned in as "alprazolam 1mg  tabs-Take 1 tablet by mouth once daily as needed. You may take 1 tablet twice daily as needed on days you have more anxiety"  Pharmacy wanted to clarify if rx was for 2 or 3 tabs max daily.  Per MD, pt is to take a maxium of 2 tabs daily.  For insurance purposes,  pharmacist will change rx to take one to two tabs daily as needed for anxiety#45 with 2 refills.  Pt is already aware of how to take medication.  No further action needed, phone call complete.Regenia Skeeter, Darlene Cassady2/12/201910:55 AM

## 2017-05-04 DIAGNOSIS — F4310 Post-traumatic stress disorder, unspecified: Secondary | ICD-10-CM | POA: Diagnosis not present

## 2017-05-04 DIAGNOSIS — F331 Major depressive disorder, recurrent, moderate: Secondary | ICD-10-CM | POA: Diagnosis not present

## 2017-05-18 DIAGNOSIS — F331 Major depressive disorder, recurrent, moderate: Secondary | ICD-10-CM | POA: Diagnosis not present

## 2017-05-20 ENCOUNTER — Encounter (HOSPITAL_COMMUNITY): Payer: Self-pay | Admitting: Emergency Medicine

## 2017-05-20 ENCOUNTER — Emergency Department (HOSPITAL_COMMUNITY): Payer: Medicare Other

## 2017-05-20 ENCOUNTER — Inpatient Hospital Stay (HOSPITAL_COMMUNITY)
Admission: EM | Admit: 2017-05-20 | Discharge: 2017-05-22 | DRG: 291 | Discharge status: Against Medical Advice | Payer: Medicare Other | Attending: Internal Medicine | Admitting: Internal Medicine

## 2017-05-20 DIAGNOSIS — R05 Cough: Secondary | ICD-10-CM | POA: Diagnosis not present

## 2017-05-20 DIAGNOSIS — F419 Anxiety disorder, unspecified: Secondary | ICD-10-CM | POA: Diagnosis not present

## 2017-05-20 DIAGNOSIS — Z170 Estrogen receptor positive status [ER+]: Secondary | ICD-10-CM

## 2017-05-20 DIAGNOSIS — J439 Emphysema, unspecified: Secondary | ICD-10-CM

## 2017-05-20 DIAGNOSIS — R0602 Shortness of breath: Secondary | ICD-10-CM | POA: Diagnosis not present

## 2017-05-20 DIAGNOSIS — I5021 Acute systolic (congestive) heart failure: Secondary | ICD-10-CM | POA: Diagnosis not present

## 2017-05-20 DIAGNOSIS — J440 Chronic obstructive pulmonary disease with acute lower respiratory infection: Secondary | ICD-10-CM | POA: Diagnosis present

## 2017-05-20 DIAGNOSIS — Z79899 Other long term (current) drug therapy: Secondary | ICD-10-CM

## 2017-05-20 DIAGNOSIS — F411 Generalized anxiety disorder: Secondary | ICD-10-CM | POA: Diagnosis present

## 2017-05-20 DIAGNOSIS — F4310 Post-traumatic stress disorder, unspecified: Secondary | ICD-10-CM | POA: Diagnosis present

## 2017-05-20 DIAGNOSIS — I130 Hypertensive heart and chronic kidney disease with heart failure and stage 1 through stage 4 chronic kidney disease, or unspecified chronic kidney disease: Secondary | ICD-10-CM | POA: Diagnosis not present

## 2017-05-20 DIAGNOSIS — F329 Major depressive disorder, single episode, unspecified: Secondary | ICD-10-CM | POA: Diagnosis present

## 2017-05-20 DIAGNOSIS — R002 Palpitations: Secondary | ICD-10-CM | POA: Diagnosis not present

## 2017-05-20 DIAGNOSIS — Z853 Personal history of malignant neoplasm of breast: Secondary | ICD-10-CM

## 2017-05-20 DIAGNOSIS — Z23 Encounter for immunization: Secondary | ICD-10-CM | POA: Diagnosis not present

## 2017-05-20 DIAGNOSIS — E872 Acidosis: Secondary | ICD-10-CM | POA: Diagnosis present

## 2017-05-20 DIAGNOSIS — I358 Other nonrheumatic aortic valve disorders: Secondary | ICD-10-CM | POA: Diagnosis present

## 2017-05-20 DIAGNOSIS — Z885 Allergy status to narcotic agent status: Secondary | ICD-10-CM

## 2017-05-20 DIAGNOSIS — F909 Attention-deficit hyperactivity disorder, unspecified type: Secondary | ICD-10-CM | POA: Diagnosis present

## 2017-05-20 DIAGNOSIS — J309 Allergic rhinitis, unspecified: Secondary | ICD-10-CM | POA: Diagnosis present

## 2017-05-20 DIAGNOSIS — Z818 Family history of other mental and behavioral disorders: Secondary | ICD-10-CM

## 2017-05-20 DIAGNOSIS — Z880 Allergy status to penicillin: Secondary | ICD-10-CM

## 2017-05-20 DIAGNOSIS — N184 Chronic kidney disease, stage 4 (severe): Secondary | ICD-10-CM | POA: Diagnosis present

## 2017-05-20 DIAGNOSIS — E785 Hyperlipidemia, unspecified: Secondary | ICD-10-CM | POA: Diagnosis present

## 2017-05-20 DIAGNOSIS — N179 Acute kidney failure, unspecified: Secondary | ICD-10-CM | POA: Diagnosis present

## 2017-05-20 DIAGNOSIS — Z9011 Acquired absence of right breast and nipple: Secondary | ICD-10-CM

## 2017-05-20 DIAGNOSIS — F17210 Nicotine dependence, cigarettes, uncomplicated: Secondary | ICD-10-CM | POA: Diagnosis present

## 2017-05-20 DIAGNOSIS — K219 Gastro-esophageal reflux disease without esophagitis: Secondary | ICD-10-CM | POA: Diagnosis present

## 2017-05-20 DIAGNOSIS — R450 Nervousness: Secondary | ICD-10-CM | POA: Diagnosis not present

## 2017-05-20 DIAGNOSIS — I509 Heart failure, unspecified: Secondary | ICD-10-CM

## 2017-05-20 DIAGNOSIS — Z809 Family history of malignant neoplasm, unspecified: Secondary | ICD-10-CM

## 2017-05-20 DIAGNOSIS — Z7951 Long term (current) use of inhaled steroids: Secondary | ICD-10-CM

## 2017-05-20 DIAGNOSIS — Z9114 Patient's other noncompliance with medication regimen: Secondary | ICD-10-CM

## 2017-05-20 DIAGNOSIS — F410 Panic disorder [episodic paroxysmal anxiety] without agoraphobia: Secondary | ICD-10-CM | POA: Diagnosis present

## 2017-05-20 DIAGNOSIS — I420 Dilated cardiomyopathy: Secondary | ICD-10-CM | POA: Diagnosis present

## 2017-05-20 DIAGNOSIS — R000 Tachycardia, unspecified: Secondary | ICD-10-CM | POA: Diagnosis not present

## 2017-05-20 DIAGNOSIS — Z9049 Acquired absence of other specified parts of digestive tract: Secondary | ICD-10-CM

## 2017-05-20 DIAGNOSIS — Z888 Allergy status to other drugs, medicaments and biological substances status: Secondary | ICD-10-CM

## 2017-05-20 DIAGNOSIS — J209 Acute bronchitis, unspecified: Secondary | ICD-10-CM | POA: Diagnosis present

## 2017-05-20 LAB — I-STAT VENOUS BLOOD GAS, ED
Acid-Base Excess: 2 mmol/L (ref 0.0–2.0)
Bicarbonate: 26.7 mmol/L (ref 20.0–28.0)
O2 Saturation: 79 %
TCO2: 28 mmol/L (ref 22–32)
pCO2, Ven: 41.7 mmHg — ABNORMAL LOW (ref 44.0–60.0)
pH, Ven: 7.414 (ref 7.250–7.430)
pO2, Ven: 43 mmHg (ref 32.0–45.0)

## 2017-05-20 LAB — CBC WITH DIFFERENTIAL/PLATELET
BASOS PCT: 0 %
Basophils Absolute: 0 10*3/uL (ref 0.0–0.1)
EOS ABS: 0 10*3/uL (ref 0.0–0.7)
EOS PCT: 0 %
HCT: 38 % (ref 36.0–46.0)
Hemoglobin: 13.3 g/dL (ref 12.0–15.0)
Lymphocytes Relative: 16 %
Lymphs Abs: 1.9 10*3/uL (ref 0.7–4.0)
MCH: 32.5 pg (ref 26.0–34.0)
MCHC: 35 g/dL (ref 30.0–36.0)
MCV: 92.9 fL (ref 78.0–100.0)
MONO ABS: 0.5 10*3/uL (ref 0.1–1.0)
MONOS PCT: 5 %
Neutro Abs: 9.1 10*3/uL — ABNORMAL HIGH (ref 1.7–7.7)
Neutrophils Relative %: 79 %
PLATELETS: 223 10*3/uL (ref 150–400)
RBC: 4.09 MIL/uL (ref 3.87–5.11)
RDW: 13.2 % (ref 11.5–15.5)
WBC: 11.6 10*3/uL — ABNORMAL HIGH (ref 4.0–10.5)

## 2017-05-20 LAB — I-STAT CHEM 8, ED
BUN: 14 mg/dL (ref 6–20)
CALCIUM ION: 1.09 mmol/L — AB (ref 1.15–1.40)
CREATININE: 1.3 mg/dL — AB (ref 0.44–1.00)
Chloride: 96 mmol/L — ABNORMAL LOW (ref 101–111)
Glucose, Bld: 136 mg/dL — ABNORMAL HIGH (ref 65–99)
HCT: 39 % (ref 36.0–46.0)
Hemoglobin: 13.3 g/dL (ref 12.0–15.0)
Potassium: 4 mmol/L (ref 3.5–5.1)
Sodium: 132 mmol/L — ABNORMAL LOW (ref 135–145)
TCO2: 23 mmol/L (ref 22–32)

## 2017-05-20 MED ORDER — LORAZEPAM 0.5 MG PO TABS
0.5000 mg | ORAL_TABLET | Freq: Once | ORAL | Status: AC
  Administered 2017-05-21: 0.5 mg via ORAL
  Filled 2017-05-20: qty 1

## 2017-05-20 MED ORDER — METHYLPREDNISOLONE SODIUM SUCC 125 MG IJ SOLR
125.0000 mg | Freq: Once | INTRAMUSCULAR | Status: AC
  Administered 2017-05-21: 125 mg via INTRAVENOUS
  Filled 2017-05-20: qty 2

## 2017-05-20 MED ORDER — ALBUTEROL SULFATE (2.5 MG/3ML) 0.083% IN NEBU: 5.0000 mg | INHALATION_SOLUTION | Freq: Once | RESPIRATORY_TRACT | Status: DC

## 2017-05-20 MED ORDER — IPRATROPIUM-ALBUTEROL 0.5-2.5 (3) MG/3ML IN SOLN
3.0000 mL | Freq: Once | RESPIRATORY_TRACT | Status: AC
  Administered 2017-05-21: 3 mL via RESPIRATORY_TRACT
  Filled 2017-05-20: qty 3

## 2017-05-20 NOTE — ED Notes (Signed)
Patient transported to X-ray 

## 2017-05-20 NOTE — ED Provider Notes (Signed)
[facility] EMERGENCY DEPARTMENT Provider Note   CSN: [id number] Arrival date & time: 05/20/17  2130     History   Chief Complaint Chief Complaint  Patient presents with  . Shortness of Breath  . Anxiety    HPI Shawna Hawkins is a 72 y.o. female.  HPI  Patient is a 72 year old female with a history of anxiety, COPD (not on any bronchidilators), depression, anxiety, hyperlipidemia and history of breast cancer status post right mastectomy (4 years ago and (presenting for shortness of breath.  Patient reports that she has been feeling unwell and having difficulty breathing for approximately 2 weeks.  Patient reports that she has been coughing productive of green to yellow sputum without hemoptysis.  Patient reports that her shortness of breath is particularly worse with exertion, or lying flat, she feels she is "smothered".  Patient denies fevers, however she reports she has been chilled.  Patient denies chest pain with her symptoms.  Patient denies nausea or diaphoresis with her shortness of breath.  Patient denies noting any unilateral leg swelling.  Patient denies estrogen use, recent immobilization, recent hospitalization, recent surgery, history of DVT/PE, or family history of DVT/PE.  Patient does have a history of breast cancer, however has been in remission for 4 years.  Patient denies any ischemic cardiac history or family history of early MI. Patient endorses 1 ppd smoking history x 30 years.  Of note, patient reports being anxious over the fact that her deceased son's first birthday since his death is upcoming.  Past Medical History:  Diagnosis Date  . Anemia    as a young woman  . Anxiety   . Bronchitis   . COPD (chronic obstructive pulmonary disease) ([place])   . Depression   . Heat exhaustion   . Hyperlipidemia     Patient Active Problem List   Diagnosis Date Noted  . Allergic rhinitis 04/22/2017  . Attention deficit hyperactivity disorder (ADHD)  04/11/2016  . PTSD (post-traumatic stress disorder) 04/11/2016  . Panic attacks 04/11/2016  . Tobacco use disorder 04/17/2015  . Elevated blood pressure reading 12/25/2014  . Insomnia 11/18/2013  . Preventative health care 01/25/2013  . History of breast cancer 11/16/2012  . Osteopenia 01/18/2012  . Generalized anxiety disorder 02/26/2006  . Hyperlipidemia 04/29/2004  . Major depressive disorder ([place]) 11/08/2003  . GASTROESOPHAGEAL REFLUX DISEASE, MILD 11/08/2003    Past Surgical History:  Procedure Laterality Date  . BREAST LUMPECTOMY WITH NEEDLE LOCALIZATION AND AXILLARY SENTINEL LYMPH NODE BX Right 10/13/2012   Procedure: BREAST LUMPECTOMY WITH NEEDLE LOCALIZATION AND AXILLARY SENTINEL LYMPH NODE BX;  Surgeon: Rolm Bookbinder, MD;  Location: [facility];  Service: General;  Laterality: Right;  . CHOLECYSTECTOMY    . MULTIPLE TOOTH EXTRACTIONS    . POLYPECTOMY  2008  . TONSILLECTOMY      OB History    No data available       Home Medications    Prior to Admission medications   Medication Sig Start Date End Date Taking? Authorizing Provider  ALPRAZolam Duanne Moron) 1 MG tablet Take 1 tablet by mouth once daily as needed. You may take 1 tablet twice daily as needed on days you have more anxiety. Pharmacy - please fill 30 days from last dispense date. 04/27/17   Shela Leff, MD  atorvastatin (LIPITOR) 40 MG tablet Take 1 tablet (40 mg total) by mouth daily. 04/22/17   Shela Leff, MD  FLUoxetine (PROZAC) 20 MG capsule Take 1 capsule (20 mg total) by mouth  daily. 04/22/17   Shela Leff, MD  omeprazole (PRILOSEC) 40 MG capsule Take 1 capsule (40 mg total) by mouth daily. 04/22/17   Shela Leff, MD  triamcinolone (NASACORT) 55 MCG/ACT AERO nasal inhaler Place 2 sprays into the nose daily. 04/22/17   Shela Leff, MD    Family History Family History  Problem Relation Age of Onset  . Depression Mother   . Cancer Mother        lung    Social History Social  History   Tobacco Use  . Smoking status: Current Every Day Smoker    Packs/day: 0.50    Years: 30.00    Pack years: 15.00    Types: Cigarettes  . Smokeless tobacco: Never Used  . Tobacco comment: "trying to quit"  Substance Use Topics  . Alcohol use: No    Alcohol/week: 0.0 oz  . Drug use: No     Allergies   Codeine sulfate; Hydroxyzine hcl; Oxycodone-acetaminophen; Oxycodone-aspirin; and Penicillins   Review of Systems Review of Systems  Constitutional: Negative for chills and fever.  HENT: Negative for congestion and rhinorrhea.   Respiratory: Positive for cough and shortness of breath.   Cardiovascular: Positive for palpitations. Negative for chest pain and leg swelling.  Gastrointestinal: Negative for abdominal pain, nausea and vomiting.  Genitourinary: Negative for difficulty urinating.  Musculoskeletal: Negative for back pain.  Skin: Negative for rash.  Psychiatric/Behavioral: The patient is nervous/anxious.      Physical Exam Updated Vital Signs BP (!) 160/93 (BP Location: Right Arm)   Pulse (!) 122   Temp 97.6 F (36.4 C) (Oral)   Resp (!) 22   Ht 5' 3.5" (1.613 m)   Wt 48.1 kg (106 lb)   LMP 10/23/1982   SpO2 95%   BMI 18.48 kg/m   Physical Exam  Constitutional: She appears well-developed and well-nourished. No distress.  HENT:  Head: Normocephalic and atraumatic.  Mouth/Throat: Oropharynx is clear and moist.  Eyes: Conjunctivae and EOM are normal. Pupils are equal, round, and reactive to light.  Neck: Normal range of motion. Neck supple.  Cardiovascular: Regular rhythm, S1 normal and S2 normal.  No murmur heard. Tachycardia noted.  Intact, 2+ radial pulses.   Pulmonary/Chest: She has no wheezes.  Tachypnea noted.  No accessory muscle use. Coarse crackles noted in b/l lung bases.  1+ nonpitting lower extremity edema that is symmetric b/l.  Abdominal: Soft. She exhibits no distension. There is no tenderness. There is no guarding.   Musculoskeletal: Normal range of motion. She exhibits no edema or deformity.  Lymphadenopathy:    She has no cervical adenopathy.  Neurological: She is alert.  Cranial nerves grossly intact. Patient moves extremities symmetrically and with good coordination.  Skin: Skin is warm and dry. No rash noted. No erythema.  Psychiatric: She has a normal mood and affect. Her behavior is normal. Judgment and thought content normal.  Nursing note and vitals reviewed.    ED Treatments / Results  Labs (all labs ordered are listed, but only abnormal results are displayed) Labs Reviewed - No data to display  EKG  EKG Interpretation None       Radiology No results found.  Procedures Procedures (including critical care time)  Medications Ordered in ED Medications - No data to display   Initial Impression / Assessment and Plan / ED Course  I have reviewed the triage vital signs and the nursing notes.  Pertinent labs & imaging results that were available during my care of the patient  were reviewed by me and considered in my medical decision making (see chart for details).     Patient is chronically ill-appearing, tachypneic, and exhibiting respiratory discomfort without respiratory distress.  Patient with tachycardia, but normal blood pressure.  Differential diagnosis includes pulmonary embolism, COPD exacerbation, new onset heart failure, MI, pneumonia, pleural effusion, pneumothorax.  Chest x-ray, reviewed by me, demonstrates interstitial edema, hyperinflation consistent with chronic COPD, small bilateral pleural effusions, but no clear focal consolidation.  Will initiate cardiac and dyspnea workup at this time.  CTPA ordered.  12:32 AM Lactic acid noted to be elevated at 2.9.  Do not suspect the patient is septic at this time, as she is afebrile, normotensive.  This is a nonspecific finding.  Do believe that patient's symptoms are most likely pulmonary etiology at this time.  This was  discussed with Dr. Betsey Holiday.  Will pursue CTPA and results of that before admission.  1:23 AM Patient reassessed.  Patient is moving air better and her lungs.  Coarse crackles noted in bilateral lung bases.  BNP is greater than 4500.  Will order 40 mg of IV Lasix.  Suspect new onset heart failure is contributory to patient's symptoms.  Additionally, patient noted to have a rectal temp of 100.3.  2:52 AM Case signed out to Dr. Malachy Moan, who will follow CTPA results and admit the patient.  Final Clinical Impressions(s) / ED Diagnoses   Final diagnoses:  Shortness of breath    ED Discharge Orders    None       Tamala Julian 05/21/17 0252    Orpah Greek, MD 05/21/17 [phone]

## 2017-05-20 NOTE — ED Triage Notes (Addendum)
Per EMS, pt from home. Pt reporting sob on exertion that has been going on the last 2 weeks which has gotten worse today. Lung sounds clear. Pt has been anxious d/t birthday of deceased son coming up. Pt reports lack of sleep, generalized body aches and decreased appetite. Pt upset and tearful in triage. Pt denies SI. Pt has hx of COPD. EMS VS BP 164/108, P 122, R 24, SPO2 99%.

## 2017-05-21 ENCOUNTER — Inpatient Hospital Stay (HOSPITAL_COMMUNITY): Payer: Medicare Other

## 2017-05-21 ENCOUNTER — Emergency Department (HOSPITAL_COMMUNITY): Payer: Medicare Other

## 2017-05-21 ENCOUNTER — Other Ambulatory Visit: Payer: Self-pay

## 2017-05-21 ENCOUNTER — Encounter (HOSPITAL_COMMUNITY): Payer: Self-pay | Admitting: Radiology

## 2017-05-21 DIAGNOSIS — N179 Acute kidney failure, unspecified: Secondary | ICD-10-CM

## 2017-05-21 DIAGNOSIS — I428 Other cardiomyopathies: Secondary | ICD-10-CM

## 2017-05-21 DIAGNOSIS — Z79899 Other long term (current) drug therapy: Secondary | ICD-10-CM | POA: Diagnosis not present

## 2017-05-21 DIAGNOSIS — R0602 Shortness of breath: Secondary | ICD-10-CM | POA: Diagnosis not present

## 2017-05-21 DIAGNOSIS — Z7951 Long term (current) use of inhaled steroids: Secondary | ICD-10-CM | POA: Diagnosis not present

## 2017-05-21 DIAGNOSIS — R509 Fever, unspecified: Secondary | ICD-10-CM | POA: Diagnosis not present

## 2017-05-21 DIAGNOSIS — Z9011 Acquired absence of right breast and nipple: Secondary | ICD-10-CM | POA: Diagnosis not present

## 2017-05-21 DIAGNOSIS — J439 Emphysema, unspecified: Secondary | ICD-10-CM

## 2017-05-21 DIAGNOSIS — F411 Generalized anxiety disorder: Secondary | ICD-10-CM

## 2017-05-21 DIAGNOSIS — K219 Gastro-esophageal reflux disease without esophagitis: Secondary | ICD-10-CM | POA: Diagnosis present

## 2017-05-21 DIAGNOSIS — I509 Heart failure, unspecified: Secondary | ICD-10-CM

## 2017-05-21 DIAGNOSIS — I130 Hypertensive heart and chronic kidney disease with heart failure and stage 1 through stage 4 chronic kidney disease, or unspecified chronic kidney disease: Secondary | ICD-10-CM | POA: Diagnosis present

## 2017-05-21 DIAGNOSIS — J309 Allergic rhinitis, unspecified: Secondary | ICD-10-CM | POA: Diagnosis present

## 2017-05-21 DIAGNOSIS — J209 Acute bronchitis, unspecified: Secondary | ICD-10-CM | POA: Diagnosis present

## 2017-05-21 DIAGNOSIS — I340 Nonrheumatic mitral (valve) insufficiency: Secondary | ICD-10-CM | POA: Diagnosis not present

## 2017-05-21 DIAGNOSIS — F329 Major depressive disorder, single episode, unspecified: Secondary | ICD-10-CM | POA: Diagnosis present

## 2017-05-21 DIAGNOSIS — I5021 Acute systolic (congestive) heart failure: Secondary | ICD-10-CM | POA: Diagnosis not present

## 2017-05-21 DIAGNOSIS — Z853 Personal history of malignant neoplasm of breast: Secondary | ICD-10-CM | POA: Diagnosis not present

## 2017-05-21 DIAGNOSIS — E785 Hyperlipidemia, unspecified: Secondary | ICD-10-CM

## 2017-05-21 DIAGNOSIS — J440 Chronic obstructive pulmonary disease with acute lower respiratory infection: Secondary | ICD-10-CM | POA: Diagnosis present

## 2017-05-21 DIAGNOSIS — Z23 Encounter for immunization: Secondary | ICD-10-CM | POA: Diagnosis not present

## 2017-05-21 DIAGNOSIS — I358 Other nonrheumatic aortic valve disorders: Secondary | ICD-10-CM | POA: Diagnosis present

## 2017-05-21 DIAGNOSIS — F410 Panic disorder [episodic paroxysmal anxiety] without agoraphobia: Secondary | ICD-10-CM | POA: Diagnosis present

## 2017-05-21 DIAGNOSIS — R030 Elevated blood-pressure reading, without diagnosis of hypertension: Secondary | ICD-10-CM | POA: Diagnosis not present

## 2017-05-21 DIAGNOSIS — J449 Chronic obstructive pulmonary disease, unspecified: Secondary | ICD-10-CM | POA: Diagnosis not present

## 2017-05-21 DIAGNOSIS — I420 Dilated cardiomyopathy: Secondary | ICD-10-CM | POA: Diagnosis present

## 2017-05-21 DIAGNOSIS — F4310 Post-traumatic stress disorder, unspecified: Secondary | ICD-10-CM | POA: Diagnosis present

## 2017-05-21 DIAGNOSIS — E872 Acidosis: Secondary | ICD-10-CM

## 2017-05-21 DIAGNOSIS — N184 Chronic kidney disease, stage 4 (severe): Secondary | ICD-10-CM | POA: Diagnosis present

## 2017-05-21 DIAGNOSIS — Z9049 Acquired absence of other specified parts of digestive tract: Secondary | ICD-10-CM | POA: Diagnosis not present

## 2017-05-21 DIAGNOSIS — F17210 Nicotine dependence, cigarettes, uncomplicated: Secondary | ICD-10-CM | POA: Diagnosis present

## 2017-05-21 DIAGNOSIS — F909 Attention-deficit hyperactivity disorder, unspecified type: Secondary | ICD-10-CM | POA: Diagnosis present

## 2017-05-21 LAB — BASIC METABOLIC PANEL
ANION GAP: 14 (ref 5–15)
BUN: 13 mg/dL (ref 6–20)
CO2: 22 mmol/L (ref 22–32)
Calcium: 9.4 mg/dL (ref 8.9–10.3)
Chloride: 94 mmol/L — ABNORMAL LOW (ref 101–111)
Creatinine, Ser: 1.38 mg/dL — ABNORMAL HIGH (ref 0.44–1.00)
GFR calc Af Amer: 44 mL/min — ABNORMAL LOW (ref >60)
GFR, EST NON AFRICAN AMERICAN: 38 mL/min — AB (ref >60)
Glucose, Bld: 138 mg/dL — ABNORMAL HIGH (ref 65–99)
Potassium: 3.9 mmol/L (ref 3.5–5.1)
SODIUM: 130 mmol/L — AB (ref 135–145)

## 2017-05-21 LAB — CBC
HCT: 39.1 % (ref 36.0–46.0)
Hemoglobin: 14 g/dL (ref 12.0–15.0)
MCH: 32.6 pg (ref 26.0–34.0)
MCHC: 35.8 g/dL (ref 30.0–36.0)
MCV: 91.1 fL (ref 78.0–100.0)
PLATELETS: 217 10*3/uL (ref 150–400)
RBC: 4.29 MIL/uL (ref 3.87–5.11)
RDW: 12.9 % (ref 11.5–15.5)
WBC: 8.5 10*3/uL (ref 4.0–10.5)

## 2017-05-21 LAB — RESPIRATORY PANEL BY PCR
Adenovirus: NOT DETECTED
Bordetella pertussis: NOT DETECTED
CORONAVIRUS 229E-RVPPCR: NOT DETECTED
CORONAVIRUS HKU1-RVPPCR: NOT DETECTED
Chlamydophila pneumoniae: NOT DETECTED
Coronavirus NL63: NOT DETECTED
Coronavirus OC43: NOT DETECTED
Influenza A: NOT DETECTED
Influenza B: NOT DETECTED
METAPNEUMOVIRUS-RVPPCR: NOT DETECTED
Mycoplasma pneumoniae: NOT DETECTED
PARAINFLUENZA VIRUS 1-RVPPCR: NOT DETECTED
PARAINFLUENZA VIRUS 2-RVPPCR: NOT DETECTED
Parainfluenza Virus 3: NOT DETECTED
Parainfluenza Virus 4: NOT DETECTED
Respiratory Syncytial Virus: NOT DETECTED
Rhinovirus / Enterovirus: NOT DETECTED

## 2017-05-21 LAB — URINALYSIS, ROUTINE W REFLEX MICROSCOPIC
Bacteria, UA: NONE SEEN
Bilirubin Urine: NEGATIVE
GLUCOSE, UA: NEGATIVE mg/dL
Ketones, ur: NEGATIVE mg/dL
Nitrite: NEGATIVE
Protein, ur: NEGATIVE mg/dL
SPECIFIC GRAVITY, URINE: 1.01 (ref 1.005–1.030)
Squamous Epithelial / LPF: NONE SEEN
pH: 6 (ref 5.0–8.0)

## 2017-05-21 LAB — LACTIC ACID, PLASMA
Lactic Acid, Venous: 3 mmol/L (ref 0.5–1.9)
Lactic Acid, Venous: 2.2 mmol/L (ref 0.5–1.9)

## 2017-05-21 LAB — I-STAT TROPONIN, ED: TROPONIN I, POC: 0.07 ng/mL (ref 0.00–0.08)

## 2017-05-21 LAB — COMPREHENSIVE METABOLIC PANEL
ALK PHOS: 80 U/L (ref 38–126)
ALT: 30 U/L (ref 14–54)
AST: 46 U/L — AB (ref 15–41)
Albumin: 4.1 g/dL (ref 3.5–5.0)
Anion gap: 18 — ABNORMAL HIGH (ref 5–15)
BUN: 16 mg/dL (ref 6–20)
CALCIUM: 9.3 mg/dL (ref 8.9–10.3)
CO2: 22 mmol/L (ref 22–32)
CREATININE: 1.59 mg/dL — AB (ref 0.44–1.00)
Chloride: 91 mmol/L — ABNORMAL LOW (ref 101–111)
GFR calc Af Amer: 37 mL/min — ABNORMAL LOW (ref >60)
GFR calc non Af Amer: 32 mL/min — ABNORMAL LOW (ref >60)
GLUCOSE: 228 mg/dL — AB (ref 65–99)
Potassium: 3.2 mmol/L — ABNORMAL LOW (ref 3.5–5.1)
SODIUM: 131 mmol/L — AB (ref 135–145)
Total Bilirubin: 1.1 mg/dL (ref 0.3–1.2)
Total Protein: 7.8 g/dL (ref 6.5–8.1)

## 2017-05-21 LAB — ECHOCARDIOGRAM COMPLETE
Height: 63.5 in
WEIGHTICAEL: 1601.42 [oz_av]

## 2017-05-21 LAB — I-STAT CG4 LACTIC ACID, ED
Lactic Acid, Venous: 2.9 mmol/L (ref 0.5–1.9)
Lactic Acid, Venous: 3.54 mmol/L (ref 0.5–1.9)

## 2017-05-21 LAB — TROPONIN I
TROPONIN I: 0.1 ng/mL — AB (ref <0.03)
Troponin I: 0.09 ng/mL (ref <0.03)

## 2017-05-21 LAB — BRAIN NATRIURETIC PEPTIDE

## 2017-05-21 MED ORDER — PREDNISONE 20 MG PO TABS
40.0000 mg | ORAL_TABLET | Freq: Every day | ORAL | Status: DC
  Administered 2017-05-21: 40 mg via ORAL
  Filled 2017-05-21: qty 2

## 2017-05-21 MED ORDER — FUROSEMIDE 10 MG/ML IJ SOLN: 40.0000 mg | Freq: Once | INTRAMUSCULAR | Status: DC

## 2017-05-21 MED ORDER — IOPAMIDOL (ISOVUE-370) INJECTION 76%
INTRAVENOUS | Status: AC
  Filled 2017-05-21: qty 100

## 2017-05-21 MED ORDER — FUROSEMIDE 10 MG/ML IJ SOLN: 20.0000 mg | Freq: Once | INTRAMUSCULAR | Status: DC

## 2017-05-21 MED ORDER — SODIUM CHLORIDE 0.9 % IV BOLUS (SEPSIS): 1000.0000 mL | Freq: Once | INTRAVENOUS | Status: DC

## 2017-05-21 MED ORDER — INFLUENZA VAC SPLIT HIGH-DOSE 0.5 ML IM SUSY
0.5000 mL | PREFILLED_SYRINGE | INTRAMUSCULAR | Status: DC
  Filled 2017-05-21: qty 0.5

## 2017-05-21 MED ORDER — IPRATROPIUM-ALBUTEROL 0.5-2.5 (3) MG/3ML IN SOLN: 3.0000 mL | Freq: Four times a day (QID) | RESPIRATORY_TRACT | Status: DC | PRN

## 2017-05-21 MED ORDER — ACETAMINOPHEN 650 MG RE SUPP: 650.0000 mg | Freq: Four times a day (QID) | RECTAL | Status: DC | PRN

## 2017-05-21 MED ORDER — FUROSEMIDE 10 MG/ML IJ SOLN
40.0000 mg | Freq: Once | INTRAMUSCULAR | Status: AC
  Administered 2017-05-21: 40 mg via INTRAVENOUS
  Filled 2017-05-21: qty 4

## 2017-05-21 MED ORDER — SENNOSIDES-DOCUSATE SODIUM 8.6-50 MG PO TABS: 1.0000 | ORAL_TABLET | Freq: Every evening | ORAL | Status: DC | PRN

## 2017-05-21 MED ORDER — AZITHROMYCIN 250 MG PO TABS: 250.0000 mg | ORAL_TABLET | Freq: Every day | ORAL | Status: DC

## 2017-05-21 MED ORDER — LEVOFLOXACIN 500 MG PO TABS
500.0000 mg | ORAL_TABLET | Freq: Every day | ORAL | Status: DC
Start: 2017-05-21 — End: 2017-05-21
  Administered 2017-05-21: 500 mg via ORAL
  Filled 2017-05-21: qty 1

## 2017-05-21 MED ORDER — ALPRAZOLAM 0.5 MG PO TABS
1.0000 mg | ORAL_TABLET | Freq: Every day | ORAL | Status: DC | PRN
  Administered 2017-05-21: 1 mg via ORAL
  Filled 2017-05-21: qty 2

## 2017-05-21 MED ORDER — FLUOXETINE HCL 20 MG PO CAPS
20.0000 mg | ORAL_CAPSULE | Freq: Every day | ORAL | Status: DC
  Administered 2017-05-21: 20 mg via ORAL
  Filled 2017-05-21: qty 1

## 2017-05-21 MED ORDER — AZITHROMYCIN 500 MG PO TABS: 500.0000 mg | ORAL_TABLET | Freq: Every day | ORAL | Status: DC

## 2017-05-21 MED ORDER — IOPAMIDOL (ISOVUE-370) INJECTION 76%
INTRAVENOUS | Status: AC
  Administered 2017-05-21: 80 mL
  Filled 2017-05-21: qty 100

## 2017-05-21 MED ORDER — POTASSIUM CHLORIDE CRYS ER 20 MEQ PO TBCR
40.0000 meq | EXTENDED_RELEASE_TABLET | Freq: Once | ORAL | Status: AC
  Administered 2017-05-21: 40 meq via ORAL
  Filled 2017-05-21: qty 2

## 2017-05-21 MED ORDER — ENOXAPARIN SODIUM 30 MG/0.3ML ~~LOC~~ SOLN
30.0000 mg | SUBCUTANEOUS | Status: DC
  Administered 2017-05-21: 30 mg via SUBCUTANEOUS
  Filled 2017-05-21: qty 0.3

## 2017-05-21 MED ORDER — ACETAMINOPHEN 325 MG PO TABS: 650.0000 mg | ORAL_TABLET | Freq: Four times a day (QID) | ORAL | Status: DC | PRN

## 2017-05-21 MED ORDER — ATORVASTATIN CALCIUM 40 MG PO TABS
40.0000 mg | ORAL_TABLET | Freq: Every day | ORAL | Status: DC
  Administered 2017-05-21: 40 mg via ORAL
  Filled 2017-05-21: qty 1

## 2017-05-21 MED ORDER — PREDNISONE 20 MG PO TABS: 40.0000 mg | ORAL_TABLET | Freq: Every day | ORAL | Status: DC

## 2017-05-21 MED ORDER — ALPRAZOLAM 0.5 MG PO TABS
1.0000 mg | ORAL_TABLET | Freq: Once | ORAL | Status: AC
  Administered 2017-05-21: 1 mg via ORAL
  Filled 2017-05-21: qty 2

## 2017-05-21 MED ORDER — PNEUMOCOCCAL VAC POLYVALENT 25 MCG/0.5ML IJ INJ
0.5000 mL | INJECTION | INTRAMUSCULAR | Status: DC
  Filled 2017-05-21: qty 0.5

## 2017-05-21 NOTE — Progress Notes (Addendum)
   Subjective: Patient seen and examined. No acute events overnight. She states her breathing has improved since admission. She was able to walk to the bathroom and did not get as SOB as she has been the past few weeks. She denies chest pain.  Objective:  Vital signs in last 24 hours: Vitals:   05/21/17 0151 05/21/17 0230 05/21/17 0400 05/21/17 0519  BP: (!) 125/104 (!) 125/106 (!) 128/100 (!) 127/99  Pulse: (!) 117 (!) 113 (!) 112 (!) 110  Resp: 15 (!) 27 (!) 26   Temp:    (!) 97.5 F (36.4 C)  TempSrc:    Oral  SpO2: 94% 93% 95% 96%  Weight:    100 lb 1.4 oz (45.4 kg)  Height:       General: Sitting up in bed comfortably, thin female, NAD HEENT: [place]/AT, EOMI, no scleral icterus, PERRL Cardiac: RRR, No R/M/G appreciated Pulm: normal effort, CTAB, no wheezes or rales auscultated Abd: soft, non tender, non distended, BS normal Ext: extremities well perfused, no peripheral edema Neuro: alert and oriented X3, cranial nerves II-XII grossly intact   Assessment/Plan:  Active Problems:   CHF (congestive heart failure) (HCC)  Pulmonary edema Concerning for new onset heart failure with elevated BNP, volume overload on initial imaging, and crackles on exam. Received 1 dose of IV lasix in the ED, and responded well with output of ~1 liter, and improvement of symptoms this morning. She has no known history of heart failure. EKG without evidence of ST elevations, but with T wave inversions in II, V5 and V6. Initial I-stat troponin negative. She does not have a signficant history of uncontrolled hypertension. Her ambulatory blood pressures have ranged from normotensive to mildly hypertensive, she is currently not on any antihypertensives. Will rule out ACS as cause of HF, although unlikely.  -Holding diuresis for now as patient's creatinine is up to 1.59 from baseline of 0.8 likely acutely worsened due to of IV contrast load from CTA. Troponin I 0.09.  -Will likely resume lasix tomorrow - ECHO  pending - Will continue to trend troponin  - Daily weights - I/Os  AKI Cr 1.38>>1.58 this morning on repeat labs, increased from previous baseline of 0.8-1.0 in 2014. Likely cardiorenal in setting of pulmonary edema and volume overload with acute worsening secondary to IV contrast load.  - Will continue diuresis tomorrow - Repeat BMET in AM - Avoid nephrotoxic agents  Lactic acidosis Temp 100.3 Febrile in ED to 100.3. Patient has no recorded fever since. WBC within normal range of 8.5. Unclear source, CT-A without evidence of pneumonia. UA without evidence of UTI. Lactic acid trending down appropriately. RVP negative, discontinue droplet precautions. Blood culture and urine cultures pending but low suspicion for bacteremia.   ?COPD exacerbation  No PFTs on file. Imaging suggests severe emphysema. Lungs CTAB, no wheezing auscultated.  - Continuous pulse ox - Wean supplemental O2 as able - Duonebs 39mL q6h PRN for wheezing and SOB - Prednisone 40mg  daily - Azithromycin 500 mg tomorrow >> 250 mg x 4 days  Elevated BP Resolved, normotensive BP 120/70.  - Continue to monitor  HLD - Continue home atorvastatin 40mg  daily  Generalized anxiety disorder - Continue home xanax 1mg  daily PRN - Continue home fluoxetine 20mg  daily   Dispo: Anticipated discharge in approximately 1-2 day(s).   Melanee Spry, MD 05/21/2017, 12:59 PM Pager: [phone]

## 2017-05-21 NOTE — ED Notes (Signed)
Dr Betsey Holiday informed of lactic acid results 2.90

## 2017-05-21 NOTE — Progress Notes (Signed)
Troponin 0.09, MD paged, waiting response Pt is stable, vitals stable, denies CP and distress

## 2017-05-21 NOTE — ED Notes (Signed)
Patient transported to CT 

## 2017-05-21 NOTE — ED Provider Notes (Addendum)
Patient presents to the emergency department for evaluation of shortness of breath.  She reports that she has noticed over the last 2 or 3 weeks progressive shortness of breath.  Shortness of breath worsens with exertion, now can only walk very short distances in her house before she gets out of breath.  She also notices shortness of breath when she lies down at night.  No history of heart failure known.  Face to face Exam: HEENT - PERRLA Lungs -diminished Heart -regular rate, tachycardia, no M/R/G, 1+ bilateral lower extremity pitting edema Abd - S/NT/ND Neuro - alert, oriented x3  Plan: Patient with progressive dyspnea on exertion and orthopnea.  Chest x-ray consistent with heart failure. CT angiography to rule out PE and to delineate possible pneumonia within areas of edema on x-ray is performed.  CT is consistent with pulmonary edema, no PE, no areas of pneumonia.  Patient did have a repeat temperature that showed low-grade fever at 100.3.  There is no obvious source of fever, as her only complaint is shortness of breath with a cough that has been productive of sputum.  Will obtain influenza panel.  Lactic acid is elevated but this is felt to be multifactorial, specifically secondary to pulmonary edema.  Will obtain blood cultures and urine culture.  Low-grade fever and lactic acid discussed with admitting service, will monitor no antibiotics.   Orpah Greek, MD 05/21/17 0013    Orpah Greek, MD 05/21/17 [phone]

## 2017-05-21 NOTE — Progress Notes (Signed)
  Echocardiogram 2D Echocardiogram has been performed.  Tarren Sabree L Androw 05/21/2017, 3:29 PM

## 2017-05-21 NOTE — Consult Note (Addendum)
Cardiology Consultation:   Patient ID: Shawna Hawkins; [id number]; Nov 27, 1945   Admit date: 05/20/2017 Date of Consult: 05/21/2017  Primary Care Provider: Shela Leff, MD Primary Cardiologist: Candee Furbish, MD new Primary Electrophysiologist:  NA   Patient Profile:   Shawna Hawkins is a 72 y.o. female with a hx of COPD, anxiety, breast cancer, with lumpectomy who is being seen today for the evaluation of elevated troponin after admit for acute pulmonary edema at the request of Dr. Beryle Beams.  History of Present Illness:   Shawna Hawkins has a hx of COPD, anxiety, breast cancer, with lumpectomy presented to ER by EMS from home, due to [place] for 2 weeks, now has increased.  + body aches, decreased appetite and BP on arrival 164/108.   CTA of chest neg for PE and neg for PNA,  BNP 4500, troponin  0.07,  0.09  EKG I personally reviewed ST with non specific T wave abnormalities.   EKGs today similar   Tele: I personally reviewed. ST with freq PACs at times,   Echo with EF to 25%-30%, G1DD.  + moderate MR PA pk pressure 55 mmHg  Currently she is neg 1584 --admit wt is 100 lbs. Her Cr has gone from 1.38 to 1.59 she has rec'd lasix 40 mg once at 0148 this AM- while at rest in bed no SOB, no chest pain that she will admit to now.  Very difficult to have her focus on her health, she continued to discuss cleaning apartment.  Then about her sons.  She can walk to the BR with no SOB at times and at other times she has to stop halfway to catch her breath.  Past Medical History:  Diagnosis Date  . Anemia    as a young woman  . Anxiety   . Bronchitis   . COPD (chronic obstructive pulmonary disease) ([place])   . Depression   . Heat exhaustion   . Hyperlipidemia     Past Surgical History:  Procedure Laterality Date  . BREAST LUMPECTOMY WITH NEEDLE LOCALIZATION AND AXILLARY SENTINEL LYMPH NODE BX Right 10/13/2012   Procedure: BREAST LUMPECTOMY WITH NEEDLE LOCALIZATION AND AXILLARY SENTINEL LYMPH NODE  BX;  Surgeon: Rolm Bookbinder, MD;  Location: [facility];  Service: General;  Laterality: Right;  . CHOLECYSTECTOMY    . MULTIPLE TOOTH EXTRACTIONS    . POLYPECTOMY  2008  . TONSILLECTOMY       Home Medications:  Prior to Admission medications   Medication Sig Start Date End Date Taking? Authorizing Provider  ALPRAZolam Duanne Moron) 1 MG tablet Take 1 tablet by mouth once daily as needed. You may take 1 tablet twice daily as needed on days you have more anxiety. Pharmacy - please fill 30 days from last dispense date. Patient taking differently: Take 1 mg by mouth See admin instructions. Take 1 tablet by mouth once daily as needed. You may take 1 tablet twice daily as needed on days you have more anxiety. 04/27/17  Yes Shela Leff, MD  atorvastatin (LIPITOR) 40 MG tablet Take 1 tablet (40 mg total) by mouth daily. 04/22/17  Yes Shela Leff, MD  DM-Doxylamine-Acetaminophen (VICKS NYQUIL COLD & FLU NIGHT) 15-6.25-325 MG CAPS Take 1 capsule by mouth 3 times/day as needed-between meals & bedtime (for cough and congestion).   Yes [provider]  DM-Phenylephrine-Acetaminophen (ALKA-SELTZER PLS SINUS & COUGH) 10-5-325 MG CAPS Take 1 capsule by mouth as needed (for cough and congestion).   Yes [provider]  FLUoxetine (PROZAC) 20 MG capsule  Take 1 capsule (20 mg total) by mouth daily. 04/22/17  Yes Shela Leff, MD  guaiFENesin (MUCINEX) 600 MG 12 hr tablet Take 600 mg by mouth 2 (two) times daily as needed for cough or to loosen phlegm.   Yes [provider]  omeprazole (PRILOSEC) 40 MG capsule Take 1 capsule (40 mg total) by mouth daily. 04/22/17  Yes Shela Leff, MD  triamcinolone (NASACORT) 55 MCG/ACT AERO nasal inhaler Place 2 sprays into the nose daily. 04/22/17  Yes Shela Leff, MD    Inpatient Medications: Scheduled Meds: . atorvastatin  40 mg Oral Daily  . enoxaparin (LOVENOX) injection  30 mg Subcutaneous Q24H  . FLUoxetine  20 mg Oral Daily    . [START ON 05/22/2017] Influenza vac split quadrivalent PF  0.5 mL Intramuscular Tomorrow-1000  . [START ON 05/22/2017] pneumococcal 23 valent vaccine  0.5 mL Intramuscular Tomorrow-1000   Continuous Infusions:  PRN Meds: acetaminophen **OR** acetaminophen, ALPRAZolam, ipratropium-albuterol, senna-docusate  Allergies:    Allergies  Allergen Reactions  . Codeine Sulfate Nausea Only  . Hydroxyzine Hcl   . Oxycodone-Acetaminophen Nausea Only  . Oxycodone-Aspirin Nausea Only  . Penicillins     REACTION: itching    Social History:   Social History   Socioeconomic History  . Marital status: Single    Spouse name: Not on file  . Number of children: Not on file  . Years of education: Not on file  . Highest education level: Not on file  Social Needs  . Financial resource strain: Not on file  . Food insecurity - worry: Not on file  . Food insecurity - inability: Not on file  . Transportation needs - medical: Not on file  . Transportation needs - non-medical: Not on file  Occupational History  . Not on file  Tobacco Use  . Smoking status: Current Every Day Smoker    Packs/day: 0.50    Years: 30.00    Pack years: 15.00    Types: Cigarettes  . Smokeless tobacco: Never Used  . Tobacco comment: "trying to quit"  Substance and Sexual Activity  . Alcohol use: No    Alcohol/week: 0.0 oz  . Drug use: No  . Sexual activity: Not on file  Other Topics Concern  . Not on file  Social History Narrative  . Not on file    Family History:    Family History  Problem Relation Age of Onset  . Depression Mother   . Cancer Mother        lung     ROS:  Please see the history of present illness.  General:no colds or fevers, ? weight changes Skin:no rashes or ulcers HEENT:no blurred vision, no congestion CV:see HPI PUL:see HPI GI:no diarrhea constipation or melena, no indigestion GU:no hematuria, no dysuria MS:no joint pain, no claudication Neuro:no syncope, no  lightheadedness Endo:no diabetes, no thyroid disease  All other ROS reviewed and negative.     Physical Exam/Data:   Vitals:   05/21/17 0519 05/21/17 0800 05/21/17 1310 05/21/17 1359  BP: (!) 127/99 120/70 120/70 139/90  Pulse: (!) 110 90 88 (!) 116  Resp:    20  Temp: (!) 97.5 F (36.4 C)   98.4 F (36.9 C)  TempSrc: Oral   Oral  SpO2: 96%   95%  Weight: 100 lb 1.4 oz (45.4 kg)     Height:        Intake/Output Summary (Last 24 hours) at 05/21/2017 1644 Last data filed at 05/21/2017 1300 Gross per  24 hour  Intake 716 ml  Output 2000 ml  Net -1284 ml   Filed Weights   05/20/17 2203 05/21/17 0519  Weight: 106 lb (48.1 kg) 100 lb 1.4 oz (45.4 kg)   Body mass index is 17.45 kg/m.  General:  Frail female, in no acute distress, SOB when tlaks for long periods of time HEENT: normal Lymph: no adenopathy Neck: no JVD- sitting up Endocrine:  No thryomegaly Vascular: No carotid bruits; 2+ pedal pulses bil Cardiac:  normal S1, S2; RRR; no murmur gallup rub or click Lungs:  rhonchi to auscultation bilaterally, no wheezing, rhonchi or rales  Abd: soft, nontender, no hepatomegaly  Ext: no edema Musculoskeletal:  No deformities, BUE and BLE strength normal and equal Skin: warm and dry  Neuro:  Alert and oriented X 3 MAE follows commands , no focal abnormalities noted Psych:  Normal affect to flat, is tearful taking about her sons.     Relevant CV Studies: Echo 05/21/17 Study Conclusions  - Left ventricle: The cavity size was normal. Wall thickness was   increased in a pattern of mild LVH. Systolic function was   severely reduced. The estimated ejection fraction was in the   range of 25% to 30%. Diffuse hypokinesis. Doppler parameters are   consistent with abnormal left ventricular relaxation (grade 1   diastolic dysfunction). - Aortic valve: Trileaflet. Sclerosis without stenosis. There was   no regurgitation. - Mitral valve: Thickening and reduced leaflet excursion of the    mitral leaflets wtih moderate regurgitation. - Left atrium: The atrium was mildly dilated. - Right ventricle: The cavity size was mildly dilated. Mildly   reduced systolic function. - Atrial septum: No defect or patent foramen ovale was identified. - Tricuspid valve: There was mild regurgitation. - Pulmonary arteries: PA peak pressure: 55 mm Hg (S). - Inferior vena cava: The vessel was dilated. The respirophasic   diameter changes were blunted (< 50%), consistent with elevated   central venous pressure.  Impressions:  - LVEF 25-30%, global hypokinesis, mild LVH, grade 1 DD, markedly   elevated LV filling pressure, aortic valve sclerosis, moderate MR   with poor leaflet excursion, mild LAE, mild TR, RVSP 55 mmHg,   dilated IVC.  Laboratory Data:  Chemistry Recent Labs  Lab 05/20/17 2326 05/20/17 2344 05/21/17 0529  NA 130* 132* 131*  K 3.9 4.0 3.2*  CL 94* 96* 91*  CO2 22  --  22  GLUCOSE 138* 136* 228*  BUN 13 14 16   CREATININE 1.38* 1.30* 1.59*  CALCIUM 9.4  --  9.3  GFRNONAA 38*  --  32*  GFRAA 44*  --  37*  ANIONGAP 14  --  18*    Recent Labs  Lab 05/21/17 0529  PROT 7.8  ALBUMIN 4.1  AST 46*  ALT 30  ALKPHOS 80  BILITOT 1.1   Hematology Recent Labs  Lab 05/20/17 2326 05/20/17 2344 05/21/17 0529  WBC 11.6*  --  8.5  RBC 4.09  --  4.29  HGB 13.3 13.3 14.0  HCT 38.0 39.0 39.1  MCV 92.9  --  91.1  MCH 32.5  --  32.6  MCHC 35.0  --  35.8  RDW 13.2  --  12.9  PLT 223  --  217   Cardiac Enzymes Recent Labs  Lab 05/21/17 1149  TROPONINI 0.09*    Recent Labs  Lab 05/20/17 2342  TROPIPOC 0.07    BNP Recent Labs  Lab 05/20/17 2326  BNP >4,500.0*  DDimer No results for input(s): DDIMER in the last 168 hours.  Radiology/Studies:  Dg Chest 2 View  Result Date: 05/20/2017 CLINICAL DATA:  Initial evaluation for acute shortness of breath with exertion. History of COPD. EXAM: CHEST - 2 VIEW COMPARISON:  Prior radiograph from 12/26/2009.  FINDINGS: Extent to a shin of the cardiac silhouette due to AP technique. Mediastinal silhouette normal. Aortic atherosclerosis. Lungs are hyperinflated with underlying COPD. Basilar predominant pulmonary interstitial edema. Bilateral pleural effusions. Superimposed bibasilar opacities may reflect edema and/or atelectasis. Infiltrates could be considered in the correct clinical setting. No pneumothorax. No acute osseous abnormality. IMPRESSION: 1. Basilar predominant pulmonary interstitial edema with small bilateral pleural effusions. Associated bibasilar opacities favored to reflect atelectasis and/or edema. Infectious infiltrates could be considered in the correct clinical setting. 2. Underlying COPD. 3. Aortic atherosclerosis. Electronically Signed   By: Jeannine Boga M.D.   On: 05/20/2017 22:52   Ct Angio Chest Pe W/cm &/or Wo Cm  Result Date: 05/21/2017 CLINICAL DATA:  Severe shortness of breath with exertion EXAM: CT ANGIOGRAPHY CHEST WITH CONTRAST TECHNIQUE: Multidetector CT imaging of the chest was performed using the standard protocol during bolus administration of intravenous contrast. Multiplanar CT image reconstructions and MIPs were obtained to evaluate the vascular anatomy. CONTRAST:  58 mL Isovue 370 intravenous COMPARISON:  Chest x-ray 05/20/2017 FINDINGS: Cardiovascular: Satisfactory opacification of the pulmonary arteries to the segmental level. No evidence of pulmonary embolism. Reflux of contrast into the hepatic veins consistent with elevated right heart pressure. Cardiomegaly. No significant pericardial effusion. Moderate aortic atherosclerosis. No aneurysmal dilatation. Mediastinum/Nodes: Midline trachea. No thyroid mass. No significantly enlarged lymph nodes. Esophagus within normal limits. Lungs/Pleura: Small right greater than left pleural effusions. Severe emphysema. Mild ground-glass density and septal thickening in the lower lungs consistent with edema. Upper Abdomen: Suspected  aneurysmal dilatation of the proximal abdominal aorta up to 3.2 cm. Partially visualized mild hydronephrosis upper left kidney. Musculoskeletal: No chest wall abnormality. No acute or significant osseous findings. Review of the MIP images confirms the above findings. IMPRESSION: 1. Negative for acute pulmonary embolus. 2. Cardiomegaly with small right greater than left pleural effusions and hazy density in the bilateral lung bases with septal thickening suggesting pulmonary edema. Reflux of contrast into the hepatic veins suggesting elevated right heart pressure. 3. Severe emphysema 4. Suspected aneurysmal dilatation of the proximal abdominal aorta up to 3.2 cm. Recommend followup by ultrasound in 3 years. This recommendation follows ACR consensus guidelines: White Paper of the ACR Incidental Findings Committee II on Vascular Findings. J Am Coll Radiol 2013; 54:270-623 5. Possible mild hydronephrosis of partially visualized upper left kidney. Could more thoroughly evaluate with ultrasound. Aortic Atherosclerosis (ICD10-I70.0) and Emphysema (ICD10-J43.9). Electronically Signed   By: Donavan Foil M.D.   On: 05/21/2017 02:51    Assessment and Plan:   1. Acute Pulmonary Edema improved, agree with holding lasix for now.  May need another dose tomorrow. 2.          Cardiomyopathy EF 25-30% Dr. Marlou Porch has seen and recommend cardiac cath for Monday.  Discussed with pt but she prefers to go home, will need to monitor over weekend and hopefully she will agree.  No BB for now with her COPD  --if she decides to stay for cath needs to be scheduled and orders placed. 3.           CKD 3-4 -did increase with diuretic.  Hold ACE for now.  4.  ST monitor   5.            Breast cancer with lumpectomy in 2014 she declined radiation and hormone therapy.   6.           HLD on statin.     For questions or updates, please contact [place] Please consult [email or web address] for contact info under  Cardiology/STEMI.   Signed, Cecilie Kicks, NP  05/21/2017 4:44 PM  Personally seen and examined. Agree with above.  72 year old female with newly discovered dilated cardiomyopathy ejection fraction 25% with chronic kidney disease stage III/IV, history of breast cancer with lumpectomy in 2014 who was admitted with shortness of breath, pulmonary edema, worsening dyspnea on exertion for the past 2 weeks.  Currently she is quite anxious.  She states that her shortness of breath has improved.  Exam: Anxious but alert, difficult to focus conversation.  Tachycardic regular, no appreciable murmurs, lungs with minimal crackles heard at bases, somewhat poor air movement bilaterally, no significant lower extremity edema.  2+ radial pulse.  Lab work personally reviewed as above, creatinine has been rising, EKG also personally reviewed as described above.  Acute systolic heart failure/newly discovered dilated cardiomyopathy - Encourage her to have cardiac catheterization on Monday, risks and benefits discussed.  It was challenging for her to focus on this.  She was trying to negotiate going home, I advised against this. -For now, continue to hold Lasix as creatinine has risen fairly significantly.  Tachycardia may be suggestive of markedly decreased cardiac output.  Continue to assess fluid status over the weekend.  May be able to add traditional heart failure medications such as beta blockers or ACE inhibitors/Entresto soon.  Continue to work on anxiety.  We will continue to follow closely.  Candee Furbish, MD

## 2017-05-21 NOTE — ED Notes (Signed)
Dr Betsey Holiday informed of pt lactic acid 3.54

## 2017-05-21 NOTE — Progress Notes (Signed)
Pt is stable, vitals stable, xanax given once for anxiety, Echo is done today, denies CP and distress, weaned to RA, will continue to monitor  Palma Holter, RN

## 2017-05-21 NOTE — Progress Notes (Signed)
Medicine attending: I examined this patient today together with resident physician Dr. Rochele Pages and I concur with her evaluation and management plan which we discussed together. There is subtle but evolving changes on her EKGs and borderline elevation of her troponin.  I am concerned with subacute myocardial damage.  She will get an echocardiogram today.  I do think we should get cardiology involved as well. She is comfortable.  I am going to withhold diuretics in view of the rise in her creatinine likely related to IV contrast and initial diuretics.  She is oxygenating well on 2 L nasal cannula oxygen.

## 2017-05-21 NOTE — H&P (Addendum)
Date: 05/21/2017               Patient Name:  Shawna Hawkins MRN: [id number]  DOB: Nov 19, 1945 Age / Sex: 72 y.o., female   PCP: Shela Leff, MD         Medical Service: Internal Medicine Teaching Service         Attending Physician: Dr. Annia Belt, MD    First Contact: Dr. Aggie Hacker Pager: [phone]  Second Contact: Dr. Danford Bad Pager: [phone]       After Hours (After 5p/  First Contact Pager: [phone]  weekends / holidays): Second Contact Pager: [phone]   Chief Complaint: shortness of breath  History of Present Illness:  [name] is a 72yo female with PMH of COPD, anxiety, and invasive ductal breast carcinoma T1cN0 s/p right lumpectomy in 2014 who presents with gradually progressive shortness of breath.  For the last 1-1.5 months, she has been feeling more short of breath, particularly with exertion. Endorses worsened orthopnea and PND. She also endorses diaphoresis, wheezing, cough productive of green/yellow phlegm, and an episode of hemoptysis today. She endorses recent chest pain with her cough. She denies lower extremity swelling, night sweats, or unintentional weight loss. She has felt short of breath with respiratory infections/bronchitis in the past, but never this severe. She is not on any inhalers for her COPD.  She smokes 1/2 pack per week and has been smoking for 37 years, although in the last week, she has abstained from smoking due to the dyspnea. She denies alcohol use or other illicit drugs. She lives at home alone. Her oldest son died recently from terminal brain cancer and her other son died ~14 years ago.  ED Course: - BP 147/102, HR 120, temp 97.6, RR 16, O2 92% on RA - WBC 11.6. Na 130, Cr 1.38. Troponin 0.07. VBG pH 7.41. Lactic acid 2.9 -> 3.54. BNP >4500 - CXR showed pulmonary edema, pleural effusions, and possible infectious infiltrates. CT-A chest was ordered for further evaluation, which showed no PE, cardiomegaly with pleural effusions,  pulmonary edema, and elevated R heart pressure, as well as severe emphysema. - Received ativan, duoneb, methylprednisolone, IV lasix '40mg'$   Meds:  Current Meds  Medication Sig  . ALPRAZolam (XANAX) 1 MG tablet Take 1 tablet by mouth once daily as needed. You may take 1 tablet twice daily as needed on days you have more anxiety. Pharmacy - please fill 30 days from last dispense date. (Patient taking differently: Take 1 mg by mouth See admin instructions. Take 1 tablet by mouth once daily as needed. You may take 1 tablet twice daily as needed on days you have more anxiety.)  . atorvastatin (LIPITOR) 40 MG tablet Take 1 tablet (40 mg total) by mouth daily.  Marland Kitchen DM-Doxylamine-Acetaminophen (VICKS NYQUIL COLD & FLU NIGHT) 15-6.25-325 MG CAPS Take 1 capsule by mouth 3 times/day as needed-between meals & bedtime (for cough and congestion).  Marland Kitchen DM-Phenylephrine-Acetaminophen (ALKA-SELTZER PLS SINUS & COUGH) 10-5-325 MG CAPS Take 1 capsule by mouth as needed (for cough and congestion).  Marland Kitchen FLUoxetine (PROZAC) 20 MG capsule Take 1 capsule (20 mg total) by mouth daily.  Marland Kitchen guaiFENesin (MUCINEX) 600 MG 12 hr tablet Take 600 mg by mouth 2 (two) times daily as needed for cough or to loosen phlegm.  Marland Kitchen omeprazole (PRILOSEC) 40 MG capsule Take 1 capsule (40 mg total) by mouth daily.  Marland Kitchen triamcinolone (NASACORT) 55 MCG/ACT AERO nasal inhaler Place 2 sprays into the nose daily.  Allergies: Allergies as of 05/20/2017 - Review Complete 05/20/2017  Allergen Reaction Noted  . Codeine sulfate Nausea Only 12/10/2005  . Hydroxyzine hcl  12/10/2005  . Oxycodone-acetaminophen Nausea Only 12/10/2005  . Oxycodone-aspirin Nausea Only 12/10/2005  . Penicillins  12/10/2005   Past Medical History:  Diagnosis Date  . Anemia    as a young woman  . Anxiety   . Bronchitis   . COPD (chronic obstructive pulmonary disease) ([place])   . Depression   . Heat exhaustion   . Hyperlipidemia    Family History:  Family History  Problem  Relation Age of Onset  . Depression Mother   . Cancer Mother        lung   Social History:  She smokes 1/2 pack per week and has been smoking for 37 years, although in the last week, she has abstained from smoking due to the dyspnea. She denies alcohol use or other illicit drugs. She lives at home alone. Her oldest son died recently from terminal brain cancer and her other son died ~14 years ago.  Review of Systems: A complete ROS was negative except as per HPI.  Physical Exam: Blood pressure (!) 128/100, pulse (!) 112, temperature 100.3 F (37.9 C), temperature source Rectal, resp. rate (!) 26, height 5' 3.5" (1.613 m), weight 106 lb (48.1 kg), last menstrual period 10/23/1982, SpO2 95 %. GEN: Thin female sitting in bed in NAD. Alert and oriented. HENT: [place]/AT. Moist mucous membranes. No visible lesions. EYES: PERRL. Sclera non-icteric. Conjunctiva clear. NECK: No cervical LAD RESP: Wheeze in left upper lung field. Crackles at bilateral bases. No increased work of breathing, on 2L [place] CV: Tachycardic and regular rhythm. No murmurs, gallops, or rubs. No LE edema. ABD: Soft. Non-tender. Non-distended. Normoactive bowel sounds. EXT: No edema. Warm and well perfused. NEURO: Cranial nerves II-XII grossly intact. Able to lift all four extremities against gravity. No apparent audiovisual hallucinations. Speech fluent and appropriate. PSYCH: Patient is anxious and pleasant. Appropriate affect. Well-groomed; speech is tangential and mildly pressured.  Labs CBC Latest Ref Rng & Units 05/20/2017 05/20/2017 10/11/2012  WBC 4.0 - 10.5 K/uL - 11.6(H) 7.7  Hemoglobin 12.0 - 15.0 g/dL 13.3 13.3 13.4  Hematocrit 36.0 - 46.0 % 39.0 38.0 38.6  Platelets 150 - 400 K/uL - 223 201   CMP Latest Ref Rng & Units 05/20/2017 05/20/2017 10/11/2012  Glucose 65 - 99 mg/dL 136(H) 138(H) 82  BUN 6 - 20 mg/dL 14 13 5(L)  Creatinine 0.44 - 1.00 mg/dL 1.30(H) 1.38(H) 0.83  Sodium 135 - 145 mmol/L 132(L) 130(L) 138  Potassium  3.5 - 5.1 mmol/L 4.0 3.9 3.6  Chloride 101 - 111 mmol/L 96(L) 94(L) 99  CO2 22 - 32 mmol/L - 22 31  Calcium 8.9 - 10.3 mg/dL - 9.4 9.8  Total Protein 6.4 - 8.3 g/dL - - -  Total Bilirubin 0.20 - 1.20 mg/dL - - -  Alkaline Phos 40 - 150 U/L - - -  AST 5 - 34 U/L - - -  ALT 0 - 55 U/L - - -   BNP >4500 Troponin 0.07 Lactic acid 2.90 -> 3.54 VBG 7.414/41.7/43.0 UA with small Hgb, trace leukocytes, 0-5 WBC, no bacteria  EKG: personally reviewed my interpretation is sinus tachycardia (HR 117), no ST elevation, T wave inversions in V5 and V6, normal PR and QT intervals  CXR: personally reviewed my interpretation is hyperinflation, bilateral pleural effusions, R>L, pulmonary interstitial edema, no PTX  CT-A chest 1. Negative for acute  pulmonary embolus. 2. Cardiomegaly with small right greater than left pleural effusions and hazy density in the bilateral lung bases with septal thickening suggesting pulmonary edema. Reflux of contrast into the hepatic veins suggesting elevated right heart pressure. 3. Severe emphysema 4. Suspected aneurysmal dilatation of the proximal abdominal aorta up to 3.2 cm. Recommend followup by ultrasound in 3 years. This recommendation follows ACR consensus guidelines: White Paper of the ACR Incidental Findings Committee II on Vascular Findings. J Am Coll Radiol 2013; 44:010-272 5. Possible mild hydronephrosis of partially visualized upper left kidney. Could more thoroughly evaluate with ultrasound.  Aortic Atherosclerosis (ICD10-I70.0) and Emphysema (ICD10-J43.9).  Assessment & Plan by Problem: Active Problems:   CHF (congestive heart failure) [facility])  Ms. Halpin is a 72yo female with PMH of COPD, anxiety, and invasive ductal breast carcinoma s/p right lumpectomy in 2014 who presents with 1 month of progressively worsening shortness of breath. CT-A negative for PE or pneumonia. Found to have elevated BNP and pulmonary edema on imaging, concerning for  possible new onset heart failure. Also noted to be wheezing on exam, which has improved after steroids and duonebs. Will continue treating for concomitant COPD exacerbation. Rectal temp of 100.3 and lactic acidosis without a clear source of infection.  Pulmonary edema Concerning for new onset heart failure with elevated BNP, volume overload on initial imaging, and crackles on exam. Received 1 dose of IV lasix in the ED. No known history of heart failure. - Admit to telemetry - IV lasix '40mg'$  daily - Daily weights - I/Os - TTE - Cardiology consult in AM  AKI Cr 1.38, increased from previous baseline of 0.8-1.0 in 2014. Likely cardiorenal in setting of pulmonary edema and volume overload. - Diuresis, as above - CMP in AM - Avoid nephrotoxic agents  Temp 100.3 Lactic acidosis Mild WBC elevation to 11.6. Unclear source, CT-A without evidence of pneumonia. Patient denies dysuria or other urinary symptoms. UA without evidence of UTI. Lactic acidosis may be secondary to pulmonary edema. Will continue trending lactic acid and look for an infectious source. - Trend lactic acid - RVP - Droplet precautions - BCx - UCx - CBC in AM  COPD exacerbation Not on any home inhalers. No PFTs on file. Imaging suggests severe emphysema. Noted to be wheezing on EDP exam, now s/p methylprednisolone and breathing treatments with improvement in her wheezing. Appears comfortable on 2L O2 - Continuous pulse ox - Wean supplemental O2 as able - Duonebs 32m q6h PRN for wheezing and SOB - Prednisone '40mg'$  daily - Levofloxacin '500mg'$  daily  Elevated BP BP 147/102. Possibly related to anxiety. Has been noted to be elevated in previous clinic visits as well. - Continue to monitor  Hx of invasive ductal breast carcinoma s/p right lumpectomy in 2014 ER/PR positive, HER-2 negative, Ki 10%. Clear surgical margins. Patient followed with Oncology briefly, but stopped going to visits shortly after her surgery. She was  recommended to consider adjuvant chemoradiation therapy, however she states she did not want to do radiation because she did not have transportation to get to the treatments every day. She was also advised to take anastrozole, however patient declined this as well stating that she had heard bad things about it from her friends. She decided that she would take care of herself by controlling her diet. No recent mammogram since 2014 - she has refused mammogram screenings or oncologist f/u visits multiple times. Per note from PCP in 2016, "Patient verbally expressed her understanding of the benefits & risks and  adamantly refused to get a repeat mammogram. I also offered her referral to oncology and she refused stating she does not ever want to see an oncologist and does not ever want to get a mammogram done"  HLD - Continue home atorvastatin '40mg'$  daily  Generalized anxiety disorder Patient appears anxious on exam. +Recent stressors, including oldest son's death. - Continue home xanax '1mg'$  daily PRN - Continue home fluoxetine '20mg'$  daily  Diet: HH VTE PPx: Lovenox '30mg'$  Code Status: Full code Dispo: Admit patient to Inpatient with expected length of stay greater than 2 midnights.  Signed: Colbert Ewing, MD 05/21/2017, 4:47 AM  Pager: Mamie Nick [phone]

## 2017-05-22 LAB — BASIC METABOLIC PANEL
Anion gap: 14 (ref 5–15)
BUN: 29 mg/dL — ABNORMAL HIGH (ref 6–20)
CHLORIDE: 94 mmol/L — AB (ref 101–111)
CO2: 23 mmol/L (ref 22–32)
CREATININE: 1.73 mg/dL — AB (ref 0.44–1.00)
Calcium: 9.2 mg/dL (ref 8.9–10.3)
GFR calc non Af Amer: 29 mL/min — ABNORMAL LOW (ref >60)
GFR, EST AFRICAN AMERICAN: 34 mL/min — AB (ref >60)
Glucose, Bld: 133 mg/dL — ABNORMAL HIGH (ref 65–99)
POTASSIUM: 4.7 mmol/L (ref 3.5–5.1)
SODIUM: 131 mmol/L — AB (ref 135–145)

## 2017-05-22 LAB — CBC
HEMATOCRIT: 37.1 % (ref 36.0–46.0)
HEMOGLOBIN: 12.9 g/dL (ref 12.0–15.0)
MCH: 32.3 pg (ref 26.0–34.0)
MCHC: 34.8 g/dL (ref 30.0–36.0)
MCV: 92.8 fL (ref 78.0–100.0)
Platelets: 218 10*3/uL (ref 150–400)
RBC: 4 MIL/uL (ref 3.87–5.11)
RDW: 13.3 % (ref 11.5–15.5)
WBC: 17.6 10*3/uL — AB (ref 4.0–10.5)

## 2017-05-22 LAB — URINE CULTURE: Culture: <10000 — AB

## 2017-05-22 MED ORDER — FUROSEMIDE 10 MG/ML IJ SOLN: 20.0000 mg | Freq: Once | INTRAMUSCULAR | Status: DC

## 2017-05-22 MED ORDER — ALPRAZOLAM 0.5 MG PO TABS: 1.0000 mg | ORAL_TABLET | Freq: Two times a day (BID) | ORAL | Status: DC | PRN

## 2017-05-22 NOTE — Progress Notes (Addendum)
Pt signed Against medical advice paper. MD aware about this. Pt refused her morning medicines. IV taken off and tele DC, belongings bags provided and all her concerned addressed.  Pt left at 11:08 am  Palma Holter, [place]

## 2017-05-22 NOTE — Progress Notes (Signed)
Patient refused 0000 vitals. Patient was educated and still refused. Will continue to monitor.   Drue Flirt, RN

## 2017-05-22 NOTE — Progress Notes (Addendum)
Subjective: Patient seen and examined. Per night team was having increased anxiety overnight requiring an additional dose of ativan. She states her breathing has improved since admission, but has not returned to baseline. She is also coughing up small amounts of brownish/red phlegm, which started this morning. She states she has never had this before.   I discussed the plan of care with the patient including cardiac catheterization, continued diuresis, and monitoring of her kidney function. She states she does not want to stay until Monday to have the cardiac catheterization because she has financial obligations she must tend to. She also has no one to look after her home. She does not have any pets and does not elaborate further why she urgently needs to get home. She states she wants to stay and is scared to go home, but has financial obligations only she can take care off. I discussed with her that it is very dangerous for her to leave the hospital given her heart failure and acute kidney injury  and highly recommend she stay in the hospital to have the cardiac procedure, as well as continued monitoring of her kidney function. I discussed the seriousness of her heart failure and that if untreated could result in death or severe disability. She states she understands the risks, but is requesting to leave early this morning. She also inquired about giving her medication to help her breath better once she leaves the hospital. I told her that remaining in the hospital to continue her treatment will help her breathing, and I will not prescribe her medicines because it is dangerous for her to leave. She again asked about getting a prescription for an inhaler. I reiterated many times that an inhaler will not help and her heart failure is the cause of her breathing difficulties. Myself and Dr. Zada Finders attempted to convince her to stay and continue her treatment plan. Despite our efforts she was not convinced.     Objective:  Vital signs in last 24 hours: Vitals:   05/21/17 1713 05/21/17 2000 05/22/17 0328 05/22/17 0726  BP: (!) 139/94 (!) 112/96  110/70  Pulse: (!) 117 (!) 121    Resp: 18 18  18   Temp: 97.6 F (36.4 C) 98.1 F (36.7 C)  98 F (36.7 C)  TempSrc: Oral Oral  Oral  SpO2: 96% 94%  98%  Weight:   100 lb 5 oz (45.5 kg)   Height:       General: Sitting up in bed comfortably, thin female, NAD, anxious HEENT: /AT, no scleral icterus, PERRL Cardiac: RRR, No R/M/G appreciated Pulm: normal effort, CTAB, no wheezes or rales auscultated Abd: soft, non tender, non distended, BS normal Ext: extremities well perfused, no peripheral edema Neuro: alert and oriented X3, cranial nerves II-XII grossly intact   Assessment/Plan:  Active Problems:   CHF (congestive heart failure) (HCC)   Pulmonary emphysema (HCC)   AKI (acute kidney injury) (HCC)  Acute Systolic and Diastolic Heart Failure ECHO revealed LVEF 25-30%, global hypokinesis, mild LVH, grade 1 DD, markedly   elevated LV filling pressure, aortic valve sclerosis, moderate MR with poor leaflet excursion, mild LAE, mild TR, RVSP 55 mmHg, and dilated IVC. Patient diureses well after receiving 1 dose of IV lasix with output of ~1.2 liters, and improvement of SOB. The patient's EKG had subtle changes concerning for subacute myocardial ischemia and elevated serial troponin level 0.09>>0.10. Cardiology was consulted and recommended cardiac catheterization on Monday. Per cardiology note it seemed that patient  was reluctant to stay for the procedure at the time of their discussion. This morning she was insistent about leaving despite recommendations against doing so given her heart failure and worsening kidney function. I did not feel comfortable discharging the patient so stated if she wanted to leave she would have to do so against medical advice.  -If patient stays will plan for cardiac cath on Monday -Creatinine elevated this morning at   1.73, likely cardiorenal, if the patient stays would plan for low dose IV lasix 20 mg -Repeat BMET in AM -appreciate cardiology recommendations  AKI Cr 1.38>>1.58>>1.73. Previous baseline of 0.8-1.0 in 2014, so possible that this is the patient's new baseline. Discussed with patient that her kidney function was not improving and this was an additional reason why we highly recommend staying in the hospital.  - If stays will continue diuresis today with low dose IV lasix 20 mg and see if creatinine improves - Repeat BMET in AM - Avoid nephrotoxic agents   ?COPD exacerbation  No PFTs on file. Imaging suggests severe emphysema. Lungs CTAB, no wheezing auscultated. Saturating 100% on RA.  - Continuous pulse ox - Duonebs 59mL q6h PRN for wheezing and SOB - Discontinued abx and prednisone as patient's symptoms likely all related to acute HF exacerbation  Leukocytosis WBC 17.6. She has been afebrile no other signs/symptoms of infection. Blood cx pending. Likely secondary to solumedrol and prednisone. -Repeat CBC in AM if patient stays   HLD - Continue home atorvastatin 40mg  daily  Generalized anxiety disorder - Continue home xanax 1mg  daily PRN - Continue home fluoxetine 20mg  daily   Dispo: Anticipated discharge in approximately 2-3 day(s) if patient does not leave AMA.   Melanee Spry, MD 05/22/2017, 9:42 AM Pager: [phone]

## 2017-05-23 NOTE — Discharge Summary (Signed)
Name: Shawna Hawkins MRN: [id number] DOB: June 15, 1945 72 y.o. PCP: Shela Leff, MD  Date of Admission: 05/20/2017  9:30 PM Date of Discharge: 05/22/2017 Attending Physician: Murriel Hopper, MD  Discharge Diagnosis: 1. Acute Systolic Congestive Heart Failure Active Problems:   CHF (congestive heart failure) (HCC)   Pulmonary emphysema (HCC)   AKI (acute kidney injury) ([place])   Discharge Medications: Allergies as of 05/22/2017      Reactions   Codeine Sulfate Nausea Only   Hydroxyzine Hcl    Oxycodone-acetaminophen Nausea Only   Oxycodone-aspirin Nausea Only   Penicillins    REACTION: itching      Medication List    ASK your doctor about these medications   ALKA-SELTZER PLS SINUS & COUGH 10-5-325 MG Caps Generic drug:  DM-Phenylephrine-Acetaminophen Take 1 capsule by mouth as needed (for cough and congestion).   ALPRAZolam 1 MG tablet Commonly known as:  XANAX Take 1 tablet by mouth once daily as needed. You may take 1 tablet twice daily as needed on days you have more anxiety. Pharmacy - please fill 30 days from last dispense date.   atorvastatin 40 MG tablet Commonly known as:  LIPITOR Take 1 tablet (40 mg total) by mouth daily.   FLUoxetine 20 MG capsule Commonly known as:  PROZAC Take 1 capsule (20 mg total) by mouth daily.   guaiFENesin 600 MG 12 hr tablet Commonly known as:  MUCINEX Take 600 mg by mouth 2 (two) times daily as needed for cough or to loosen phlegm.   omeprazole 40 MG capsule Commonly known as:  PRILOSEC Take 1 capsule (40 mg total) by mouth daily.   triamcinolone 55 MCG/ACT Aero nasal inhaler Commonly known as:  NASACORT Place 2 sprays into the nose daily.   VICKS NYQUIL COLD & FLU NIGHT 15-6.25-325 MG Caps Generic drug:  DM-Doxylamine-Acetaminophen Take 1 capsule by mouth 3 times/day as needed-between meals & bedtime (for cough and congestion).       Disposition and follow-up:   Shawna Hawkins was discharged from [facility] in Serious condition.  At the hospital follow up visit please address:  1.   Acute Systolic Congestive Heart Failure -New diagnosis, EF 25-30% -patient left AMA prior to completion of adequate diuresis and treatment, please address volume and respiratory status -cards had planned for left heart cath, patient left prior to procedure - cards follow up?  AKI -Likely cardiorenal, also received contrast load in ED (CTA chest) -Creatinine was not improving and slowly trending upward, 1.3 >> 1.59 >> 1.73 -Repeat basic metabolic at follow  HTN -was on no medications prior -with new onset HF will need strict BP control, <130/80  2.  Labs / imaging needed at time of follow-up: BMET, cardiac catheterization  3.  Pending labs/ test needing follow-up: none  Follow-up Appointments:   Hospital Course by problem list: Active Problems:   CHF (congestive heart failure) ([place])   Pulmonary emphysema ([place])   AKI (acute kidney injury) ([place])   1.  Acute Systolic Congestive Heart Failure Shawna Hawkins was admitted to [facility] and the Internal Medicine Teaching Service for progressively worsening dyspnea. Initial labs and chest xray performed in the [facility] were consistent with pulmonary edema and concern for new onset heart failure. She was given one time doses of IV lasix, and responded well with output of >1 liter. Echocardiogram revealed  LVEF 25-30%, global hypokinesis, mild LVH, grade 1 DD, markedly elevated LV filling pressure, aortic valve sclerosis, moderate MR with poor  leaflet excursion, mild LAE, mild TR, RVSP 55 mmHg, and dilated IVC. The patient also had subtle EKG changes concerning for subacute myocardial ischemia in the setting of elevated troponin level. Cardiology was consulted and recommended left heart catheterization. Unfortunately, the patient was adamant about leaving the hospital. Risks of her leaving without completing her treatment course, including death and  disability, were discussed with her. A very long discussion was initiated trying to convince her to stay. Despite efforts, she decided to leave against medical advice.   2. Acute Kidney Injury Patient's baseline creatinine had previous been about 1. On admission, serum creatinine level was 1.3. It continued to trend upward, and on the day she left the hospital it was 1.73. Her kidney injury was also discussed at length with her. The risks of worsening renal function were discussed with her.   3. COPD Patient presented to the emergency department with shortness of breath, wheezing, cough, and increased sputum production. She was treated with Duoneb breathing treatments, and started on steroids and antibiotics. These were not resumed.    3. Generalized Anxiety Disorder The patient was very anxious the duration of her hospitalization. Fluoxetine and Xanax were continued throughout hospitalization.   Discharge Vitals:   BP 110/70 (BP Location: Right Arm)   Pulse (!) 121   Temp 98 F (36.7 C) (Oral)   Resp 18   Ht 5' 3.5" (1.613 m)   Wt 100 lb 5 oz (45.5 kg) Comment: pt refused to stand   LMP 10/23/1982   SpO2 98%   BMI 17.49 kg/m   Pertinent Labs, Studies, and Procedures:  BMP Latest Ref Rng & Units 05/22/2017 05/21/2017 05/20/2017  Glucose 65 - 99 mg/dL 133(H) 228(H) 136(H)  BUN 6 - 20 mg/dL 29(H) 16 14  Creatinine 0.44 - 1.00 mg/dL 1.73(H) 1.59(H) 1.30(H)  Sodium 135 - 145 mmol/L 131(L) 131(L) 132(L)  Potassium 3.5 - 5.1 mmol/L 4.7 3.2(L) 4.0  Chloride 101 - 111 mmol/L 94(L) 91(L) 96(L)  CO2 22 - 32 mmol/L 23 22 -  Calcium 8.9 - 10.3 mg/dL 9.2 9.3 -   CBC Latest Ref Rng & Units 05/22/2017 05/21/2017 05/20/2017  WBC 4.0 - 10.5 K/uL 17.6(H) 8.5 -  Hemoglobin 12.0 - 15.0 g/dL 12.9 14.0 13.3  Hematocrit 36.0 - 46.0 % 37.1 39.1 39.0  Platelets 150 - 400 K/uL 218 217 -   Chest Xray IMPRESSION: 1. Basilar predominant pulmonary interstitial edema with small bilateral pleural effusions.  Associated bibasilar opacities favored to reflect atelectasis and/or edema. Infectious infiltrates could be considered in the correct clinical setting. 2. Underlying COPD. 3. Aortic atherosclerosis.  CT angio Chest IMPRESSION: 1. Negative for acute pulmonary embolus. 2. Cardiomegaly with small right greater than left pleural effusions and hazy density in the bilateral lung bases with septal thickening suggesting pulmonary edema. Reflux of contrast into the hepatic veins suggesting elevated right heart pressure. 3. Severe emphysema 4. Suspected aneurysmal dilatation of the proximal abdominal aorta up to 3.2 cm. Recommend followup by ultrasound in 3 years. This recommendation follows ACR consensus guidelines: White Paper of the ACR Incidental Findings Committee II on Vascular Findings. J Am Coll Radiol 2013; 31:497-026 5. Possible mild hydronephrosis of partially visualized upper left kidney. Could more thoroughly evaluate with ultrasound.  Discharge Instructions:   Signed: Melanee Spry, MD 05/23/2017, 11:33 AM   Pager: [phone]

## 2017-05-25 ENCOUNTER — Encounter (HOSPITAL_COMMUNITY): Payer: Self-pay

## 2017-05-25 ENCOUNTER — Emergency Department (HOSPITAL_COMMUNITY): Payer: Medicare Other

## 2017-05-25 ENCOUNTER — Other Ambulatory Visit: Payer: Self-pay

## 2017-05-25 ENCOUNTER — Inpatient Hospital Stay (HOSPITAL_COMMUNITY)
Admission: EM | Admit: 2017-05-25 | Discharge: 2017-05-29 | DRG: 286 | Disposition: A | Discharge status: Home Health | Payer: Medicare Other | Attending: Internal Medicine | Admitting: Internal Medicine

## 2017-05-25 DIAGNOSIS — R0600 Dyspnea, unspecified: Secondary | ICD-10-CM | POA: Diagnosis not present

## 2017-05-25 DIAGNOSIS — I509 Heart failure, unspecified: Secondary | ICD-10-CM

## 2017-05-25 DIAGNOSIS — R7989 Other specified abnormal findings of blood chemistry: Secondary | ICD-10-CM | POA: Diagnosis present

## 2017-05-25 DIAGNOSIS — N183 Chronic kidney disease, stage 3 (moderate): Secondary | ICD-10-CM | POA: Diagnosis present

## 2017-05-25 DIAGNOSIS — I5043 Acute on chronic combined systolic (congestive) and diastolic (congestive) heart failure: Secondary | ICD-10-CM | POA: Diagnosis present

## 2017-05-25 DIAGNOSIS — I429 Cardiomyopathy, unspecified: Secondary | ICD-10-CM

## 2017-05-25 DIAGNOSIS — I420 Dilated cardiomyopathy: Secondary | ICD-10-CM | POA: Diagnosis present

## 2017-05-25 DIAGNOSIS — E876 Hypokalemia: Secondary | ICD-10-CM | POA: Diagnosis not present

## 2017-05-25 DIAGNOSIS — R000 Tachycardia, unspecified: Secondary | ICD-10-CM | POA: Diagnosis not present

## 2017-05-25 DIAGNOSIS — R402413 Glasgow coma scale score 13-15, at hospital admission: Secondary | ICD-10-CM | POA: Diagnosis present

## 2017-05-25 DIAGNOSIS — R05 Cough: Secondary | ICD-10-CM | POA: Diagnosis not present

## 2017-05-25 DIAGNOSIS — Z9981 Dependence on supplemental oxygen: Secondary | ICD-10-CM

## 2017-05-25 DIAGNOSIS — T43225A Adverse effect of selective serotonin reuptake inhibitors, initial encounter: Secondary | ICD-10-CM | POA: Diagnosis present

## 2017-05-25 DIAGNOSIS — Z885 Allergy status to narcotic agent status: Secondary | ICD-10-CM

## 2017-05-25 DIAGNOSIS — I2510 Atherosclerotic heart disease of native coronary artery without angina pectoris: Secondary | ICD-10-CM

## 2017-05-25 DIAGNOSIS — R0609 Other forms of dyspnea: Secondary | ICD-10-CM | POA: Diagnosis not present

## 2017-05-25 DIAGNOSIS — K219 Gastro-esophageal reflux disease without esophagitis: Secondary | ICD-10-CM | POA: Diagnosis present

## 2017-05-25 DIAGNOSIS — I130 Hypertensive heart and chronic kidney disease with heart failure and stage 1 through stage 4 chronic kidney disease, or unspecified chronic kidney disease: Secondary | ICD-10-CM | POA: Diagnosis not present

## 2017-05-25 DIAGNOSIS — R9431 Abnormal electrocardiogram [ECG] [EKG]: Secondary | ICD-10-CM | POA: Diagnosis present

## 2017-05-25 DIAGNOSIS — Z79899 Other long term (current) drug therapy: Secondary | ICD-10-CM

## 2017-05-25 DIAGNOSIS — J189 Pneumonia, unspecified organism: Secondary | ICD-10-CM | POA: Diagnosis not present

## 2017-05-25 DIAGNOSIS — I10 Essential (primary) hypertension: Secondary | ICD-10-CM | POA: Diagnosis present

## 2017-05-25 DIAGNOSIS — E222 Syndrome of inappropriate secretion of antidiuretic hormone: Secondary | ICD-10-CM | POA: Diagnosis not present

## 2017-05-25 DIAGNOSIS — Z9114 Patient's other noncompliance with medication regimen: Secondary | ICD-10-CM

## 2017-05-25 DIAGNOSIS — F329 Major depressive disorder, single episode, unspecified: Secondary | ICD-10-CM | POA: Diagnosis present

## 2017-05-25 DIAGNOSIS — F17210 Nicotine dependence, cigarettes, uncomplicated: Secondary | ICD-10-CM | POA: Diagnosis present

## 2017-05-25 DIAGNOSIS — F411 Generalized anxiety disorder: Secondary | ICD-10-CM | POA: Diagnosis present

## 2017-05-25 DIAGNOSIS — Z880 Allergy status to penicillin: Secondary | ICD-10-CM

## 2017-05-25 DIAGNOSIS — Y95 Nosocomial condition: Secondary | ICD-10-CM | POA: Diagnosis present

## 2017-05-25 DIAGNOSIS — Z886 Allergy status to analgesic agent status: Secondary | ICD-10-CM

## 2017-05-25 DIAGNOSIS — N179 Acute kidney failure, unspecified: Secondary | ICD-10-CM | POA: Diagnosis not present

## 2017-05-25 DIAGNOSIS — I428 Other cardiomyopathies: Secondary | ICD-10-CM | POA: Diagnosis present

## 2017-05-25 DIAGNOSIS — Z888 Allergy status to other drugs, medicaments and biological substances status: Secondary | ICD-10-CM

## 2017-05-25 DIAGNOSIS — J440 Chronic obstructive pulmonary disease with acute lower respiratory infection: Secondary | ICD-10-CM | POA: Diagnosis not present

## 2017-05-25 DIAGNOSIS — I272 Pulmonary hypertension, unspecified: Secondary | ICD-10-CM | POA: Diagnosis present

## 2017-05-25 DIAGNOSIS — R0602 Shortness of breath: Secondary | ICD-10-CM | POA: Diagnosis not present

## 2017-05-25 DIAGNOSIS — R791 Abnormal coagulation profile: Secondary | ICD-10-CM | POA: Diagnosis present

## 2017-05-25 DIAGNOSIS — Z853 Personal history of malignant neoplasm of breast: Secondary | ICD-10-CM

## 2017-05-25 DIAGNOSIS — L8990 Pressure ulcer of unspecified site, unspecified stage: Secondary | ICD-10-CM

## 2017-05-25 DIAGNOSIS — K5900 Constipation, unspecified: Secondary | ICD-10-CM | POA: Diagnosis present

## 2017-05-25 DIAGNOSIS — R079 Chest pain, unspecified: Secondary | ICD-10-CM | POA: Diagnosis not present

## 2017-05-25 DIAGNOSIS — E785 Hyperlipidemia, unspecified: Secondary | ICD-10-CM | POA: Diagnosis present

## 2017-05-25 HISTORY — DX: Essential (primary) hypertension: I10

## 2017-05-25 LAB — BASIC METABOLIC PANEL
Anion gap: 14 (ref 5–15)
BUN: 20 mg/dL (ref 6–20)
CHLORIDE: 91 mmol/L — AB (ref 101–111)
CO2: 23 mmol/L (ref 22–32)
CREATININE: 1.6 mg/dL — AB (ref 0.44–1.00)
Calcium: 8.7 mg/dL — ABNORMAL LOW (ref 8.9–10.3)
GFR calc non Af Amer: 32 mL/min — ABNORMAL LOW (ref >60)
GFR, EST AFRICAN AMERICAN: 37 mL/min — AB (ref >60)
Glucose, Bld: 139 mg/dL — ABNORMAL HIGH (ref 65–99)
Potassium: 3.7 mmol/L (ref 3.5–5.1)
SODIUM: 128 mmol/L — AB (ref 135–145)

## 2017-05-25 LAB — I-STAT TROPONIN, ED
Troponin i, poc: 0.05 ng/mL (ref 0.00–0.08)
Troponin i, poc: 0.08 ng/mL (ref 0.00–0.08)

## 2017-05-25 LAB — CBC
HCT: 39.5 % (ref 36.0–46.0)
Hemoglobin: 13.6 g/dL (ref 12.0–15.0)
MCH: 32.5 pg (ref 26.0–34.0)
MCHC: 34.4 g/dL (ref 30.0–36.0)
MCV: 94.5 fL (ref 78.0–100.0)
PLATELETS: 174 10*3/uL (ref 150–400)
RBC: 4.18 MIL/uL (ref 3.87–5.11)
RDW: 14 % (ref 11.5–15.5)
WBC: 16.3 10*3/uL — AB (ref 4.0–10.5)

## 2017-05-25 LAB — D-DIMER, QUANTITATIVE: D-Dimer, Quant: 8.75 ug/mL-FEU — ABNORMAL HIGH (ref 0.00–0.50)

## 2017-05-25 LAB — INFLUENZA PANEL BY PCR (TYPE A & B)
INFLAPCR: NEGATIVE
Influenza B By PCR: NEGATIVE

## 2017-05-25 LAB — BRAIN NATRIURETIC PEPTIDE: B Natriuretic Peptide: >4500 pg/mL — ABNORMAL HIGH (ref 0.0–100.0)

## 2017-05-25 MED ORDER — VANCOMYCIN HCL 500 MG IV SOLR: 500.0000 mg | INTRAVENOUS | Status: DC

## 2017-05-25 MED ORDER — IOPAMIDOL (ISOVUE-370) INJECTION 76%
INTRAVENOUS | Status: AC
  Administered 2017-05-25: 100 mL
  Filled 2017-05-25: qty 100

## 2017-05-25 MED ORDER — SODIUM CHLORIDE 0.9 % IV SOLN: 1.0000 g | INTRAVENOUS | Status: DC

## 2017-05-25 MED ORDER — VANCOMYCIN HCL IN DEXTROSE 1-5 GM/200ML-% IV SOLN
1000.0000 mg | Freq: Once | INTRAVENOUS | Status: AC
  Administered 2017-05-25: 1000 mg via INTRAVENOUS
  Filled 2017-05-25: qty 200

## 2017-05-25 MED ORDER — SODIUM CHLORIDE 0.9 % IV SOLN
1.0000 g | Freq: Once | INTRAVENOUS | Status: AC
  Administered 2017-05-25: 1 g via INTRAVENOUS
  Filled 2017-05-25: qty 1

## 2017-05-25 NOTE — ED Notes (Signed)
Writer ambulate with pt, pt O2 sats range between 94-97 % on Room air.

## 2017-05-25 NOTE — ED Notes (Signed)
Results reviewed

## 2017-05-25 NOTE — H&P (Addendum)
Date: 05/26/2017               Patient Name:  Shawna Hawkins MRN: [id number]  DOB: Apr 25, 1945 Age / Sex: 72 y.o., female   PCP: Shela Leff, MD         Medical Service: Internal Medicine Teaching Service         Attending Physician: Dr. Pixie Casino, MD    First Contact: Dr. Aggie Hacker Pager: [phone]  Second Contact: Dr. Danford Bad Pager: [phone]       After Hours (After 5p/  First Contact Pager: [phone]  weekends / holidays): Second Contact Pager: [phone]   Chief Complaint: chest pain  History of Present Illness:  Ms. Moudy is a 72yo female with PMH of HFrEF (TTE 05/2017: LVEF 25-30%, global hypokinesis, grade 1 DD), COPD, tobacco use disorder, generalized anxiety disorder, and invasive ductal breast carcinoma T1cN0 s/p right lumpectomy in 2014 who presents with shortness of breath. Of note, patient was recently admitted to our service 3/8-3/9 for new onset HFrEF exacerbation and AKI. She was diuresed with improvement in her symptoms. Cardiology was consulted and she was recommended to stay through the weekend to get a cath done on Monday, however she stated that she needed to get home and left AMA prior to complete work-up being done. She was not discharged with any new medications.  She reports that her breathing had improved after leaving the hospital on the 9th. Her shortness of breath returned within a couple of days. She states she is unable to lie flat. She denies fevers, leg swelling, or PND. She also endorses a productive cough that has been intermittent for the last few weeks. She saw a few spots of blood with her cough today, but not otherwise.  ED Course: - BP 146/92, HR 104, RR 18, temp 98, O2 97% on RA - WBC 16.3. Na 128, Cr 1.6. Troponin 0.05 -> 0.08, D-dimer elevated, BNP >4500. Flu negative - CXR suggests possible mild pneumonia, as well as cardiomegaly. CT-A showed no PE. Also showed cardiomegaly with right heart dysfunction and small bilateral pleural effusions.  EKG shows sinus tachycardia (HR 101), LVH, inferolateral T wave inversions. - Started on vanc/cefepime.  Meds:  Current Meds  Medication Sig  . ALPRAZolam (XANAX) 1 MG tablet Take 1 tablet by mouth once daily as needed. You may take 1 tablet twice daily as needed on days you have more anxiety. Pharmacy - please fill 30 days from last dispense date. (Patient taking differently: Take 1 mg by mouth See admin instructions. Take 1 tablet by mouth once daily as needed. You may take 1 tablet twice daily as needed on days you have more anxiety.)  . atorvastatin (LIPITOR) 40 MG tablet Take 1 tablet (40 mg total) by mouth daily.  Marland Kitchen DM-Phenylephrine-Acetaminophen (ALKA-SELTZER PLS SINUS & COUGH) 10-5-325 MG CAPS Take 1 capsule by mouth as needed (for cough and congestion).  Marland Kitchen FLUoxetine (PROZAC) 20 MG capsule Take 1 capsule (20 mg total) by mouth daily.  Marland Kitchen omeprazole (PRILOSEC) 40 MG capsule Take 1 capsule (40 mg total) by mouth daily.  Marland Kitchen triamcinolone (NASACORT) 55 MCG/ACT AERO nasal inhaler Place 2 sprays into the nose daily.   Allergies: Allergies as of 05/25/2017 - Review Complete 05/25/2017  Allergen Reaction Noted  . Codeine sulfate Nausea Only 12/10/2005  . Hydroxyzine hcl  12/10/2005  . Oxycodone-acetaminophen Nausea Only 12/10/2005  . Oxycodone-aspirin Nausea Only 12/10/2005  . Penicillins Itching 12/10/2005   Past Medical History:  Diagnosis  Date  . Anemia    as a young woman  . Anxiety   . Bronchitis   . COPD (chronic obstructive pulmonary disease) ([employer])   . Depression   . Heat exhaustion   . Hyperlipidemia    Family History:  Family History  Problem Relation Age of Onset  . Depression Mother   . Cancer Mother        lung   Social History: She smokes 1/2 pack per week and has been smoking for 37 years, although in the last week, she has abstained from smoking due to the dyspnea. She denies alcohol use or other illicit drugs. She lives at home alone. Her oldest son died  recently from terminal brain cancer and her other son died ~14 years ago.  Review of Systems: A complete ROS was negative except as per HPI.  Physical Exam: Blood pressure (!) 123/95, pulse 99, temperature 98 F (36.7 C), temperature source Oral, resp. rate (!) 24, height 5' 3" (1.6 m), weight 100 lb (45.4 kg), last menstrual period 10/23/1982, SpO2 93 %.  GEN: Thin diaphoretic female lying in bed. Alert and oriented. Mild distress. Towel over her forehead. HENT: [place]/AT. EYES: Sclera non-icteric. Conjunctiva clear. RESP: Tachypneic. Decreased breath sounds in lower bases. No audible wheezes. Appears uncomfortable but satting well on RA CV: Normal rate and regular rhythm. No murmurs, gallops, or rubs. No carotid bruits. Unable to appreciate JVD. No LE edema. ABD: Soft. Non-tender. Non-distended. Normoactive bowel sounds. EXT: No edema. Warm and well perfused. NEURO: Cranial nerves II-XII grossly intact. Able to lift all four extremities against gravity. No apparent audiovisual hallucinations. Speech fluent and appropriate. PSYCH: Patient is anxious. Appropriate affect. Well-groomed; speech is tangential and mildly pressured.  Labs CBC Latest Ref Rng & Units 05/25/2017 05/22/2017 05/21/2017  WBC 4.0 - 10.5 K/uL 16.3(H) 17.6(H) 8.5  Hemoglobin 12.0 - 15.0 g/dL 13.6 12.9 14.0  Hematocrit 36.0 - 46.0 % 39.5 37.1 39.1  Platelets 150 - 400 K/uL 174 218 217   CMP Latest Ref Rng & Units 05/25/2017 05/22/2017 05/21/2017  Glucose 65 - 99 mg/dL 139(H) 133(H) 228(H)  BUN 6 - 20 mg/dL 20 29(H) 16  Creatinine 0.44 - 1.00 mg/dL 1.60(H) 1.73(H) 1.59(H)  Sodium 135 - 145 mmol/L 128(L) 131(L) 131(L)  Potassium 3.5 - 5.1 mmol/L 3.7 4.7 3.2(L)  Chloride 101 - 111 mmol/L 91(L) 94(L) 91(L)  CO2 22 - 32 mmol/L _0 Calcium 8.9 - 10.3 mg/dL 8.7(L) 9.2 9.3  Total Protein 6.5 - 8.1 g/dL - - 7.8  Total Bilirubin 0.3 - 1.2 mg/dL - - 1.1  Alkaline Phos 38 - 126 U/L - - 80  AST 15 - 41 U/L - - 46(H)  ALT 14 - 54  U/L - - 30   Troponin 0.05 -> 0.08 BNP >4500 D-Dimer 8.75 Influenza A/B negative  EKG: personally reviewed my interpretation is borderline sinus tachycardia (HR 101), LVH, no ST elevation, inferolateral T wave inversions  CXR: personally reviewed my interpretation is mild cardiomegaly, bilateral pleural effusions, R>L, no PTX, no convincing consolidation suggesting PNA  CT-A chest 1. No CT evidence of acute or occlusive pulmonary artery embolus. 2. Cardiomegaly with evidence of right heart dysfunction and findings of CHF including small bilateral pleural effusions. Clinical correlation is recommended. 3. Aortic Atherosclerosis (ICD10-I70.0) and Emphysema (ICD10-J43.9).  Assessment & Plan by Problem: Active Problems:   * No active hospital problems. *  Ms. Wienke is a 72yo female with PMH of HFrEF (TTE 05/2017: LVEF  25-30%, global hypokinesis, grade 1 DD), COPD, tobacco use disorder, generalized anxiety disorder, and invasive ductal breast carcinoma T1cN0 s/p right lumpectomy in 2014. She was recently admitted to our service and diuresed for new onset HFrEF but left AMA prior to completing the work-up and receiving her discharge medications. She returns with recurrent shortness of breath that had initially improved after leaving the hospital but returned a couple days ago. BNP >4500 and CT-A confirms right heart dysfunction and bilateral pleural effusions. Symptoms most consistent with heart failure exacerbation. She was also noted to have an elevated D-dimer in the setting of a history of cancer, for which CT-A was performed, which was negative for PE. CXR suggested possible PNA, so she received a dose of vanc/cefepime in the ED.  HFrEF (TTE 05/2017: LVEF 25-30%, global hypokinesis, grade 1 DD) Newly diagnosed during her last admission, although she left AMA prior to completing the work-up and receiving her discharge medications. She was evaluated by Cardiology during that admission, who  recommended cardiac cath in order to evaluate possible ischemic etiology for her new onset heart failure, however patient left prior to getting this done. She stated she felt better after being discharged, but her symptoms returned, likely secondary to getting some IV diuresis while here and then going home without getting any further diuresis. She complains of significant dyspnea on my evaluation, will continue IV diuresis. - Admit to telemetry - Consult Cardiology in AM - IV lasix 20m x1 - BMP in AM - Daily weights - I/Os  ?Pneumonia CXR suggests possible pneumonia, although I feel the hazy opacity is more likely to represent fluid or atelectasis. Flu negative. She is tachycardic and has an elevated WBC at 16.3, but afebrile. She received a dose of IV vanc/cefepime for empiric coverage of HCAP. This should cover her for 24h, will check a procalcitonin and re-assess in AM. - Continue IV vanc/cefepime - CBC in AM - Procalcitonin  AKI Cr 1.60 on admission, baseline in 2014 was 0.8-1. Likely cardiorenal in setting of volume overload in untreated HFrEF. Also just received a contrast load with CT-A. Will still give diuretics despite this due to patient's significant dyspnea. Will need to continue monitoring renal function closely. - BMP in AM - Avoid nephrotoxic agents - Diuresis, as above  Hyponatremia Na 128 on admission, was also low (130-131) during her recent admission. Glucose only 139. Likely 2/2 volume overload in setting of heart failure. - Diuresis, as above  Elevated BP BP slightly elevated to normal thus far during her admission. Not on any home anti-hypertensives. Likely will benefit from continued outpatient monitoring of BP. - Continue to monitor  COPD Not on home inhalers. No PFTs on file. Recent CT-A chest suggests severe emphysema. Patient would benefit from outpatient management of COPD after discharge. - Duonebs PRN  Generalized anxiety disorder - Continue home  fluoxetine 230mdaily - Continue home xanax 62m14mID PRN  HLD - Continue home atorvastatin 35m42mily  Hx of invasive ductal breast carcinoma s/p right lumpectomy in 2014 ER/PR positive, HER-2 negative, Ki 10%. Clear surgical margins. Patient followed with Oncology briefly, but stopped going to visits shortly after her surgery. She was recommended to consider adjuvant chemoradiation therapy, however did not want to do radiation because she did not have transportation to get to the treatments every day. She was also advised to take anastrozole, however patient declined this as well stating that she had heard bad things about it from her friends. She decided that she would  take care of herself by controlling her diet. No recent mammogram since 2014 - she has refused mammogram screenings or oncologist f/u visits multiple times.  Tobacco use disorder Currently smokes ~1/2 pack per week and has been smoking for 37 years - Counseling for smoking cessation  Diet: HH VTE PPx: Lovenox Code Status: Full code Dispo: Admit patient to Inpatient with expected length of stay greater than 2 midnights.  Signed: Colbert Ewing, MD 05/26/2017, 12:37 AM  Pager: Mamie Nick [phone]

## 2017-05-25 NOTE — ED Notes (Signed)
Patient transported to CT 

## 2017-05-25 NOTE — ED Triage Notes (Addendum)
Pt with COPD from home for non radiating central CP, productive cough, nausea, and SOB. Pt seen 3/7 for same and left AMA prior to having cardiac cath performed. A&Ox4. Denies fever or chills. Plan while admitted was to rule out acute vs subacute MI

## 2017-05-25 NOTE — ED Provider Notes (Signed)
[facility] EMERGENCY DEPARTMENT Provider Note   CSN: [id number] Arrival date & time: 05/25/17  1529     History   Chief Complaint Chief Complaint  Patient presents with  . Chest Pain  . Shortness of Breath    HPI Shawna Hawkins is a 72 y.o. female w/ h/o COPD, HLD, tobacco abuse, HTN, cardiomyopathy with EF 25-30% here for worsening shortness of breath with minimal activity at home. States she can't walk to her kitchen to cook herself food or to get her mail due to dyspnea on exertion. Also reports increased sputum production with cough. chest tightness with ambulation, and orthopnea. Was supposed to get a heart cath last week but left AMA because she was scared she may die and not wake up from the procedure. States she is willing to go through procedure now. Meant to come yesterday but was too short of breath to go anywhere.   Denies fevers, sore throat, chills, nausea, vomiting, abdominal pain, urinary symptoms, diarrhea. Has been intermittently constipated. Does not use oxygen at home.   HPI  Past Medical History:  Diagnosis Date  . Anemia    as a young woman  . Anxiety   . Bronchitis   . COPD (chronic obstructive pulmonary disease) ([place])   . Depression   . Heat exhaustion   . Hyperlipidemia     Patient Active Problem List   Diagnosis Date Noted  . CHF (congestive heart failure) ([place]) 05/21/2017  . Pulmonary emphysema ([place])   . AKI (acute kidney injury) ([place])   . Allergic rhinitis 04/22/2017  . Attention deficit hyperactivity disorder (ADHD) 04/11/2016  . PTSD (post-traumatic stress disorder) 04/11/2016  . Panic attacks 04/11/2016  . Tobacco use disorder 04/17/2015  . Elevated blood pressure reading 12/25/2014  . Insomnia 11/18/2013  . Preventative health care 01/25/2013  . History of breast cancer 11/16/2012  . Osteopenia 01/18/2012  . Generalized anxiety disorder 02/26/2006  . Hyperlipidemia 04/29/2004  . Major depressive disorder ([place])  11/08/2003  . GASTROESOPHAGEAL REFLUX DISEASE, MILD 11/08/2003    Past Surgical History:  Procedure Laterality Date  . BREAST LUMPECTOMY WITH NEEDLE LOCALIZATION AND AXILLARY SENTINEL LYMPH NODE BX Right 10/13/2012   Procedure: BREAST LUMPECTOMY WITH NEEDLE LOCALIZATION AND AXILLARY SENTINEL LYMPH NODE BX;  Surgeon: Rolm Bookbinder, MD;  Location: [facility];  Service: General;  Laterality: Right;  . CHOLECYSTECTOMY    . MULTIPLE TOOTH EXTRACTIONS    . POLYPECTOMY  2008  . TONSILLECTOMY      OB History    No data available       Home Medications    Prior to Admission medications   Medication Sig Start Date End Date Taking? Authorizing Provider  ALPRAZolam Duanne Moron) 1 MG tablet Take 1 tablet by mouth once daily as needed. You may take 1 tablet twice daily as needed on days you have more anxiety. Pharmacy - please fill 30 days from last dispense date. Patient taking differently: Take 1 mg by mouth See admin instructions. Take 1 tablet by mouth once daily as needed. You may take 1 tablet twice daily as needed on days you have more anxiety. 04/27/17  Yes Shela Leff, MD  atorvastatin (LIPITOR) 40 MG tablet Take 1 tablet (40 mg total) by mouth daily. 04/22/17  Yes Shela Leff, MD  DM-Phenylephrine-Acetaminophen (ALKA-SELTZER PLS SINUS & COUGH) 10-5-325 MG CAPS Take 1 capsule by mouth as needed (for cough and congestion).   Yes [provider]  FLUoxetine (PROZAC) 20 MG capsule Take 1  capsule (20 mg total) by mouth daily. 04/22/17  Yes Shela Leff, MD  omeprazole (PRILOSEC) 40 MG capsule Take 1 capsule (40 mg total) by mouth daily. 04/22/17  Yes Shela Leff, MD  triamcinolone (NASACORT) 55 MCG/ACT AERO nasal inhaler Place 2 sprays into the nose daily. 04/22/17  Yes Shela Leff, MD    Family History Family History  Problem Relation Age of Onset  . Depression Mother   . Cancer Mother        lung    Social History Social History   Tobacco Use  .  Smoking status: Current Every Day Smoker    Packs/day: 0.50    Years: 30.00    Pack years: 15.00    Types: Cigarettes  . Smokeless tobacco: Never Used  . Tobacco comment: "trying to quit"  Substance Use Topics  . Alcohol use: No    Alcohol/week: 0.0 oz  . Drug use: No     Allergies   Codeine sulfate; Hydroxyzine hcl; Oxycodone-acetaminophen; Oxycodone-aspirin; and Penicillins   Review of Systems Review of Systems  Respiratory: Positive for cough, chest tightness and shortness of breath.   Cardiovascular: Positive for chest pain.  Gastrointestinal: Positive for constipation.  All other systems reviewed and are negative.    Physical Exam Updated Vital Signs BP (!) 123/95   Pulse 99   Temp 98 F (36.7 C) (Oral)   Resp (!) 24   Ht 5\' 3"  (1.6 m)   Wt 45.4 kg (100 lb)   LMP 10/23/1982   SpO2 93%   BMI 17.71 kg/m   Physical Exam  Constitutional: She appears well-developed and well-nourished.  NAD. Non toxic.   HENT:  Head: Normocephalic and atraumatic.  Nose: Nose normal.  Moist mucous membranes. Tonsils and oropharynx normal  Eyes: Conjunctivae, EOM and lids are normal.  Neck: Trachea normal and normal range of motion.  Neck is supple. Trachea midline. No cervical adenopathy  Cardiovascular: Regular rhythm, S1 normal, S2 normal and normal heart sounds. Tachycardia present.  Pulses:      Carotid pulses are 2+ on the right side, and 2+ on the left side.      Radial pulses are 2+ on the right side, and 2+ on the left side.       Dorsalis pedis pulses are 2+ on the right side, and 2+ on the left side.  RRR.  No LE edema or calf tenderness.   Pulmonary/Chest: Effort normal. Tachypnea noted. No respiratory distress. She has wheezes in the right upper field and the left upper field. She has rhonchi (improved after forceful cough) in the right middle field and the left middle field.  Speaking in full sentences. No reproducible chest wall tenderness.   Abdominal: Soft.  Bowel sounds are normal. There is no tenderness.  No epigastric tenderness. No distention.   Neurological: She is alert. GCS eye subscore is 4. GCS verbal subscore is 5. GCS motor subscore is 6.  Skin: Skin is warm and dry. Capillary refill takes less than 2 seconds.  No rash to chest wall  Psychiatric: She has a normal mood and affect. Her speech is normal and behavior is normal. Judgment and thought content normal. Cognition and memory are normal.     ED Treatments / Results  Labs (all labs ordered are listed, but only abnormal results are displayed) Labs Reviewed  BASIC METABOLIC PANEL - Abnormal; Notable for the following components:      Result Value   Sodium 128 (*)    Chloride  91 (*)    Glucose, Bld 139 (*)    Creatinine, Ser 1.60 (*)    Calcium 8.7 (*)    GFR calc non Af Amer 32 (*)    GFR calc Af Amer 37 (*)    All other components within normal limits  CBC - Abnormal; Notable for the following components:   WBC 16.3 (*)    All other components within normal limits  D-DIMER, QUANTITATIVE (NOT AT [facility]) - Abnormal; Notable for the following components:   D-Dimer, Quant 8.75 (*)    All other components within normal limits  BRAIN NATRIURETIC PEPTIDE - Abnormal; Notable for the following components:   B Natriuretic Peptide >4,500.0 (*)    All other components within normal limits  INFLUENZA PANEL BY PCR (TYPE A & B)  I-STAT TROPONIN, ED  I-STAT TROPONIN, ED    EKG  EKG Interpretation  Date/Time:  Tuesday May 25 2017 16:00:11 EDT Ventricular Rate:  101 PR Interval:  130 QRS Duration: 78 QT Interval:  338 QTC Calculation: 438 R Axis:   80 Text Interpretation:  Sinus tachycardia Possible Left atrial enlargement Left ventricular hypertrophy ST & T wave abnormality, consider lateral ischemia Abnormal ECG t wave inversions in lateral leads are more prominent Confirmed by Alfonzo Beers [phone]) on 05/25/2017 7:25:04 PM       Radiology Dg Chest 2 View  Result  Date: 05/25/2017 CLINICAL DATA:  Acute onset of central chest pain, productive cough, nausea and shortness of breath. EXAM: CHEST - 2 VIEW COMPARISON:  Chest radiograph performed 05/20/2017, and CTA of the chest performed 05/21/2017 FINDINGS: The lungs are well-aerated. Mild scarring is noted at the right lung base. Minimal left basilar opacity may reflect atelectasis or possibly mild pneumonia. There is no evidence of pleural effusion or pneumothorax. The heart is mildly enlarged. No acute osseous abnormalities are seen. Clips are noted at the right breast. IMPRESSION: Minimal left basilar airspace opacity may reflect atelectasis or possibly mild pneumonia. Mild cardiomegaly. Electronically Signed   By: Garald Balding M.D.   On: 05/25/2017 16:30    Procedures Procedures (including critical care time)  Medications Ordered in ED Medications  ceFEPIme (MAXIPIME) 1 g in sodium chloride 0.9 % 100 mL IVPB (not administered)  vancomycin (VANCOCIN) 500 mg in sodium chloride 0.9 % 100 mL IVPB (not administered)  ceFEPIme (MAXIPIME) 1 g in sodium chloride 0.9 % 100 mL IVPB (0 g Intravenous Stopped 05/25/17 2157)  vancomycin (VANCOCIN) IVPB 1000 mg/200 mL premix (0 mg Intravenous Stopped 05/25/17 2259)  iopamidol (ISOVUE-370) 76 % injection (100 mLs  Contrast Given 05/25/17 2329)     Initial Impression / Assessment and Plan / ED Course  I have reviewed the triage vital signs and the nursing notes.  Pertinent labs & imaging results that were available during my care of the patient were reviewed by me and considered in my medical decision making (see chart for details).  Clinical Course as of May 26 2339  Tue May 25, 2017  2045 Creatinine: (!) 1.60 [CG]  2301 D-Dimer, Quant: (!) 8.75 [CG]  2301 WBC: (!) 16.3 [CG]  2301 Sodium: (!) 128 [CG]  2301 Creatinine: (!) 1.60 [CG]  2301 GFR, Est Non African American: (!) 32 [CG]  2301 FINDINGS: The lungs are well-aerated. Mild scarring is noted at the right  lung base. Minimal left basilar opacity may reflect atelectasis or possibly mild pneumonia. There is no evidence of pleural effusion or pneumothorax.  The heart is mildly enlarged. No acute  osseous abnormalities are seen. Clips are noted at the right breast. DG Chest 2 View [CG]  2302 Sinus tachycardia Possible Left atrial enlargement Left ventricular hypertrophy ST & T wave abnormality, consider lateral ischemia Abnormal ECG t wave inversions in lateral leads are more prominent Confirmed by Alfonzo Beers [phone]) on 05/25/2017 7:25:04 PM ED EKG within 10 minutes [CG]    Clinical Course User Index [CG] Kinnie Feil, PA-C   72 yo female with worsening dyspnea and sputum production since leaving AMA from hospital 3/9 before LHC. Last echo shows 60-45%, reduced systolic function of RV and elevated PA pressure.   On exam pt has been tachycardic and intermittently tachypnic. No new oxygen requirements on ambulation. Will obtain screening labs, EKG, CXR, d-dimer, BNP, influenza test.   Final Clinical Impressions(s) / ED Diagnoses   Work up remarkable for mild AKI 1.60, elevated d-dimer 8.75, leukocytosis WBC 16.3, hyponatremia 128. CXR with minimal left basilar opacity atelectasis vs pneumonia.  EKG with LVH, non specific ST-T wave lateral abnormalities, and more prominent TWI in lateral leads.   2310: CTA for PE pending. VS have remained stable, tachycardic and tachypnic. Trop 0.05>0.08. Flu negative. Will consult internal med for admission. Patient is now agreeable to go through [facility].  Final diagnoses:  Dyspnea on exertion    ED Discharge Orders    None       Arlean Hopping 05/25/17 2341    Pixie Casino, MD 05/26/17 1610

## 2017-05-25 NOTE — Progress Notes (Signed)
Pharmacy Antibiotic Note  Shawna Hawkins is a 72 y.o. female admitted on 05/25/2017 with pneumonia.  Pharmacy has been consulted for vancomycin dosing.  Given cefepime x 1. SCr 1.6, CrCl ~55ml/min  Plan: Give vancomycin 1g IV x 1, then start vancomycin 500mg  IV Q24h Start cefepime 1g IV Q24h Monitor clinical picture, renal function, VT prn F/U C&S, abx deescalation / LOT   Height: 5\' 3"  (160 cm) Weight: 100 lb (45.4 kg) IBW/kg (Calculated) : 52.4  Temp (24hrs), Avg:98 F (36.7 C), Min:98 F (36.7 C), Max:98 F (36.7 C)  Recent Labs  Lab 05/20/17 2326 05/20/17 2344 05/21/17 0018 05/21/17 0233 05/21/17 0529 05/21/17 0630 05/21/17 0927 05/22/17 0533 05/25/17 1605  WBC 11.6*  --   --   --  8.5  --   --  17.6* 16.3*  CREATININE 1.38* 1.30*  --   --  1.59*  --   --  1.73* 1.60*  LATICACIDVEN  --   --  2.90* 3.54*  --  3.0* 2.2*  --   --     Estimated Creatinine Clearance: 23.8 mL/min (A) (by C-G formula based on SCr of 1.6 mg/dL (H)).    Allergies  Allergen Reactions  . Codeine Sulfate Nausea Only  . Hydroxyzine Hcl   . Oxycodone-Acetaminophen Nausea Only  . Oxycodone-Aspirin Nausea Only  . Penicillins Itching    Has patient had a PCN reaction causing immediate rash, facial/tongue/throat swelling, SOB or lightheadedness with hypotension: unknown Has patient had a PCN reaction causing severe rash involving mucus membranes or skin necrosis: unknown Has patient had a PCN reaction that required hospitalization: no Has patient had a PCN reaction occurring within the last 10 years: no If all of the above answers are "NO", then may proceed with Cephalosporin use.     Thank you for allowing pharmacy to be a part of this patient's care.  Reginia Naas 05/25/2017 8:41 PM

## 2017-05-26 ENCOUNTER — Other Ambulatory Visit: Payer: Self-pay

## 2017-05-26 ENCOUNTER — Encounter (HOSPITAL_COMMUNITY): Payer: Self-pay | Admitting: Oncology

## 2017-05-26 DIAGNOSIS — K5900 Constipation, unspecified: Secondary | ICD-10-CM | POA: Diagnosis present

## 2017-05-26 DIAGNOSIS — I5043 Acute on chronic combined systolic (congestive) and diastolic (congestive) heart failure: Secondary | ICD-10-CM | POA: Diagnosis present

## 2017-05-26 DIAGNOSIS — F411 Generalized anxiety disorder: Secondary | ICD-10-CM

## 2017-05-26 DIAGNOSIS — Z79899 Other long term (current) drug therapy: Secondary | ICD-10-CM

## 2017-05-26 DIAGNOSIS — F329 Major depressive disorder, single episode, unspecified: Secondary | ICD-10-CM | POA: Diagnosis present

## 2017-05-26 DIAGNOSIS — R918 Other nonspecific abnormal finding of lung field: Secondary | ICD-10-CM | POA: Diagnosis not present

## 2017-05-26 DIAGNOSIS — J440 Chronic obstructive pulmonary disease with acute lower respiratory infection: Secondary | ICD-10-CM | POA: Diagnosis present

## 2017-05-26 DIAGNOSIS — I519 Heart disease, unspecified: Secondary | ICD-10-CM | POA: Diagnosis not present

## 2017-05-26 DIAGNOSIS — Z853 Personal history of malignant neoplasm of breast: Secondary | ICD-10-CM

## 2017-05-26 DIAGNOSIS — Z9114 Patient's other noncompliance with medication regimen: Secondary | ICD-10-CM

## 2017-05-26 DIAGNOSIS — J189 Pneumonia, unspecified organism: Secondary | ICD-10-CM | POA: Diagnosis present

## 2017-05-26 DIAGNOSIS — Z9981 Dependence on supplemental oxygen: Secondary | ICD-10-CM | POA: Diagnosis not present

## 2017-05-26 DIAGNOSIS — I5041 Acute combined systolic (congestive) and diastolic (congestive) heart failure: Secondary | ICD-10-CM

## 2017-05-26 DIAGNOSIS — I130 Hypertensive heart and chronic kidney disease with heart failure and stage 1 through stage 4 chronic kidney disease, or unspecified chronic kidney disease: Principal | ICD-10-CM

## 2017-05-26 DIAGNOSIS — N183 Chronic kidney disease, stage 3 (moderate): Secondary | ICD-10-CM

## 2017-05-26 DIAGNOSIS — E222 Syndrome of inappropriate secretion of antidiuretic hormone: Secondary | ICD-10-CM | POA: Diagnosis present

## 2017-05-26 DIAGNOSIS — E876 Hypokalemia: Secondary | ICD-10-CM

## 2017-05-26 DIAGNOSIS — I272 Pulmonary hypertension, unspecified: Secondary | ICD-10-CM | POA: Diagnosis present

## 2017-05-26 DIAGNOSIS — R402413 Glasgow coma scale score 13-15, at hospital admission: Secondary | ICD-10-CM | POA: Diagnosis present

## 2017-05-26 DIAGNOSIS — I10 Essential (primary) hypertension: Secondary | ICD-10-CM | POA: Diagnosis not present

## 2017-05-26 DIAGNOSIS — R791 Abnormal coagulation profile: Secondary | ICD-10-CM | POA: Diagnosis present

## 2017-05-26 DIAGNOSIS — R9431 Abnormal electrocardiogram [ECG] [EKG]: Secondary | ICD-10-CM | POA: Diagnosis present

## 2017-05-26 DIAGNOSIS — R000 Tachycardia, unspecified: Secondary | ICD-10-CM | POA: Diagnosis present

## 2017-05-26 DIAGNOSIS — E871 Hypo-osmolality and hyponatremia: Secondary | ICD-10-CM | POA: Diagnosis not present

## 2017-05-26 DIAGNOSIS — J449 Chronic obstructive pulmonary disease, unspecified: Secondary | ICD-10-CM

## 2017-05-26 DIAGNOSIS — I428 Other cardiomyopathies: Secondary | ICD-10-CM | POA: Diagnosis present

## 2017-05-26 DIAGNOSIS — N179 Acute kidney failure, unspecified: Secondary | ICD-10-CM | POA: Diagnosis not present

## 2017-05-26 DIAGNOSIS — I5023 Acute on chronic systolic (congestive) heart failure: Secondary | ICD-10-CM

## 2017-05-26 DIAGNOSIS — F17210 Nicotine dependence, cigarettes, uncomplicated: Secondary | ICD-10-CM

## 2017-05-26 DIAGNOSIS — Y95 Nosocomial condition: Secondary | ICD-10-CM | POA: Diagnosis present

## 2017-05-26 DIAGNOSIS — K219 Gastro-esophageal reflux disease without esophagitis: Secondary | ICD-10-CM | POA: Diagnosis not present

## 2017-05-26 DIAGNOSIS — R0609 Other forms of dyspnea: Secondary | ICD-10-CM | POA: Diagnosis not present

## 2017-05-26 DIAGNOSIS — I429 Cardiomyopathy, unspecified: Secondary | ICD-10-CM

## 2017-05-26 DIAGNOSIS — Z880 Allergy status to penicillin: Secondary | ICD-10-CM

## 2017-05-26 DIAGNOSIS — Z885 Allergy status to narcotic agent status: Secondary | ICD-10-CM

## 2017-05-26 DIAGNOSIS — I878 Other specified disorders of veins: Secondary | ICD-10-CM | POA: Diagnosis not present

## 2017-05-26 DIAGNOSIS — I5021 Acute systolic (congestive) heart failure: Secondary | ICD-10-CM | POA: Diagnosis not present

## 2017-05-26 DIAGNOSIS — I420 Dilated cardiomyopathy: Secondary | ICD-10-CM | POA: Diagnosis present

## 2017-05-26 DIAGNOSIS — T43225A Adverse effect of selective serotonin reuptake inhibitors, initial encounter: Secondary | ICD-10-CM | POA: Diagnosis present

## 2017-05-26 DIAGNOSIS — E785 Hyperlipidemia, unspecified: Secondary | ICD-10-CM | POA: Diagnosis not present

## 2017-05-26 DIAGNOSIS — I2510 Atherosclerotic heart disease of native coronary artery without angina pectoris: Secondary | ICD-10-CM | POA: Diagnosis not present

## 2017-05-26 DIAGNOSIS — I5020 Unspecified systolic (congestive) heart failure: Secondary | ICD-10-CM | POA: Diagnosis not present

## 2017-05-26 DIAGNOSIS — Z7982 Long term (current) use of aspirin: Secondary | ICD-10-CM | POA: Diagnosis not present

## 2017-05-26 DIAGNOSIS — Z888 Allergy status to other drugs, medicaments and biological substances status: Secondary | ICD-10-CM

## 2017-05-26 LAB — CBC
HCT: 40.8 % (ref 36.0–46.0)
HEMOGLOBIN: 13.9 g/dL (ref 12.0–15.0)
MCH: 32.4 pg (ref 26.0–34.0)
MCHC: 34.1 g/dL (ref 30.0–36.0)
MCV: 95.1 fL (ref 78.0–100.0)
Platelets: 142 10*3/uL — ABNORMAL LOW (ref 150–400)
RBC: 4.29 MIL/uL (ref 3.87–5.11)
RDW: 14.3 % (ref 11.5–15.5)
WBC: 15.8 10*3/uL — ABNORMAL HIGH (ref 4.0–10.5)

## 2017-05-26 LAB — BASIC METABOLIC PANEL
ANION GAP: 14 (ref 5–15)
BUN: 21 mg/dL — ABNORMAL HIGH (ref 6–20)
CALCIUM: 8.7 mg/dL — AB (ref 8.9–10.3)
CO2: 23 mmol/L (ref 22–32)
Chloride: 91 mmol/L — ABNORMAL LOW (ref 101–111)
Creatinine, Ser: 1.52 mg/dL — ABNORMAL HIGH (ref 0.44–1.00)
GFR calc non Af Amer: 34 mL/min — ABNORMAL LOW (ref >60)
GFR, EST AFRICAN AMERICAN: 39 mL/min — AB (ref >60)
Glucose, Bld: 113 mg/dL — ABNORMAL HIGH (ref 65–99)
Potassium: 3.4 mmol/L — ABNORMAL LOW (ref 3.5–5.1)
Sodium: 128 mmol/L — ABNORMAL LOW (ref 135–145)

## 2017-05-26 LAB — CULTURE, BLOOD (ROUTINE X 2)
Culture: NO GROWTH
Culture: NO GROWTH
SPECIAL REQUESTS: ADEQUATE
Special Requests: ADEQUATE

## 2017-05-26 LAB — PROCALCITONIN: Procalcitonin: 0.14 ng/mL

## 2017-05-26 LAB — TROPONIN I
TROPONIN I: 0.09 ng/mL — AB (ref <0.03)
TROPONIN I: 0.09 ng/mL — AB (ref <0.03)

## 2017-05-26 MED ORDER — ONDANSETRON HCL 4 MG PO TABS
4.0000 mg | ORAL_TABLET | Freq: Three times a day (TID) | ORAL | Status: DC | PRN
  Administered 2017-05-26: 4 mg via ORAL
  Filled 2017-05-26: qty 1

## 2017-05-26 MED ORDER — ALPRAZOLAM 0.5 MG PO TABS
1.0000 mg | ORAL_TABLET | Freq: Two times a day (BID) | ORAL | Status: DC | PRN
  Administered 2017-05-26 – 2017-05-29 (×5): 1 mg via ORAL
  Filled 2017-05-26 (×6): qty 2

## 2017-05-26 MED ORDER — SODIUM CHLORIDE 0.9 % IV SOLN: 250.0000 mL | INTRAVENOUS | Status: DC | PRN

## 2017-05-26 MED ORDER — SODIUM CHLORIDE 0.9% FLUSH: 3.0000 mL | INTRAVENOUS | Status: DC | PRN

## 2017-05-26 MED ORDER — FLUOXETINE HCL 20 MG PO CAPS
20.0000 mg | ORAL_CAPSULE | Freq: Every day | ORAL | Status: DC
  Administered 2017-05-26 – 2017-05-29 (×4): 20 mg via ORAL
  Filled 2017-05-26 (×4): qty 1

## 2017-05-26 MED ORDER — IPRATROPIUM-ALBUTEROL 0.5-2.5 (3) MG/3ML IN SOLN: 3.0000 mL | Freq: Four times a day (QID) | RESPIRATORY_TRACT | Status: DC | PRN

## 2017-05-26 MED ORDER — SODIUM CHLORIDE 0.9% FLUSH
3.0000 mL | Freq: Two times a day (BID) | INTRAVENOUS | Status: DC
  Administered 2017-05-26 – 2017-05-27 (×2): 3 mL via INTRAVENOUS

## 2017-05-26 MED ORDER — ASPIRIN 81 MG PO CHEW
81.0000 mg | CHEWABLE_TABLET | ORAL | Status: AC
  Administered 2017-05-27: 81 mg via ORAL
  Filled 2017-05-26: qty 1

## 2017-05-26 MED ORDER — FUROSEMIDE 10 MG/ML IJ SOLN
40.0000 mg | Freq: Every day | INTRAMUSCULAR | Status: DC
  Administered 2017-05-26: 40 mg via INTRAVENOUS
  Filled 2017-05-26 (×2): qty 4

## 2017-05-26 MED ORDER — ONDANSETRON HCL 4 MG/2ML IJ SOLN: 4.0000 mg | Freq: Three times a day (TID) | INTRAMUSCULAR | Status: DC | PRN

## 2017-05-26 MED ORDER — ACETAMINOPHEN 650 MG RE SUPP: 650.0000 mg | Freq: Four times a day (QID) | RECTAL | Status: DC | PRN

## 2017-05-26 MED ORDER — SODIUM CHLORIDE 0.9 % IV SOLN
INTRAVENOUS | Status: DC
  Administered 2017-05-27: 06:00:00 via INTRAVENOUS

## 2017-05-26 MED ORDER — FUROSEMIDE 10 MG/ML IJ SOLN: 40.0000 mg | Freq: Once | INTRAMUSCULAR | Status: DC

## 2017-05-26 MED ORDER — FUROSEMIDE 10 MG/ML IJ SOLN
40.0000 mg | Freq: Once | INTRAMUSCULAR | Status: AC
Start: 2017-05-26 — End: 2017-05-26
  Administered 2017-05-26: 40 mg via INTRAVENOUS
  Filled 2017-05-26: qty 4

## 2017-05-26 MED ORDER — ENOXAPARIN SODIUM 300 MG/3ML IJ SOLN
20.0000 mg | INTRAMUSCULAR | Status: DC
  Administered 2017-05-26: 20 mg via SUBCUTANEOUS
  Filled 2017-05-26 (×2): qty 0.2

## 2017-05-26 MED ORDER — ACETAMINOPHEN 325 MG PO TABS
650.0000 mg | ORAL_TABLET | Freq: Four times a day (QID) | ORAL | Status: DC | PRN
  Administered 2017-05-28: 650 mg via ORAL
  Filled 2017-05-26: qty 2

## 2017-05-26 MED ORDER — SENNOSIDES-DOCUSATE SODIUM 8.6-50 MG PO TABS
1.0000 | ORAL_TABLET | Freq: Every evening | ORAL | Status: DC | PRN
  Administered 2017-05-28: 1 via ORAL
  Filled 2017-05-26: qty 1

## 2017-05-26 MED ORDER — POTASSIUM CHLORIDE CRYS ER 20 MEQ PO TBCR
40.0000 meq | EXTENDED_RELEASE_TABLET | Freq: Once | ORAL | Status: AC
  Administered 2017-05-26: 40 meq via ORAL
  Filled 2017-05-26: qty 2

## 2017-05-26 NOTE — Progress Notes (Signed)
Chaplain came to Pt.'s room by way of [place] to execute Advanced Directive.  Advanced Directive has been completed, witnessed, and notarized. Original and copies are with the patient and a copy has been placed in the chart. Shawna Hawkins and Shawna Hawkins did engage in a pastoral conversation. Patient -Shawna Hawkins- was reflective about her life and shared experience with significant loss (two sons) and her love for music and the significant role music has played in her life.

## 2017-05-26 NOTE — Progress Notes (Signed)
   Subjective:  Patient seen and examined. She was resting comfortably in bed. She states she was feeling very tired. No other acute complaints.   Objective:  Vital signs in last 24 hours: Vitals:   05/26/17 0235 05/26/17 0500 05/26/17 0530 05/26/17 0615  BP: 139/85   107/79  Pulse: 94   91  Resp:   (!) 23 (!) 24  Temp: 97.9 F (36.6 C)   98 F (36.7 C)  TempSrc: Oral   Oral  SpO2: 93%  96% 98%  Weight:  105 lb 2.6 oz (47.7 kg)    Height:       General: Laying in bed comfortably, NAD HEENT: [place]/AT, no scleral icterus Cardiac: RRR, No R/M/G appreciated Pulm: normal effort, left basilar crackles/rales, right base CTAB, no wheezing  Abd: soft, non tender, non distended, BS normal Ext: extremities well perfused, no peripheral edema Neuro: alert and oriented X3, cranial nerves II-XII grossly intact   Assessment/Plan:  Active Problems:   CHF (congestive heart failure) (HCC)   HTN (hypertension), benign   Secondary cardiomyopathy (HCC)  Acute Systolic and Diastolic Heart Failure Patient presented with DOE and orthopnea. CXR with evidence of pulmonary edema and bilateral pleural effusions. ECHO on 3/8 revealed LVEF 25-30%, global hypokinesis, mild LVH, and grade 1 DD. During previous admission the patient was evaluated by cardiology for elevated troponin levels and new onset HF. She was scheduled for LHC at that time but left AMA prior to procedure. Troponin level elevated at 0.09. Received 1x dose of IV lasix in the ED, output of about 850 cc. Patient with JVD and left basilar crackles on exam today, no lower extremity edema.  Will resume with treatment plan for acute HF exacerbation.  -Will continue with IV Lasix 40 mg daily -Cardiology consulted again to reschedule LHC, appreciate their recommendations -Strict I/Os -Daily weights -will monitor creatinine and lytes with daily bmets   ?CAP, Leukocytosis ?COPD Received vanc/cefepime in ED. CXR with minimal left basilar opacity  that may reflect atelectasis or possibly mild pneumonia. Patient is afebrile with stable vital signs. WBC 15.8 on admission.  -will discontinue abx at this time -will continue to monitor vitals and trend elevated WBC  -will continue duonebs q6 PRN for SOB or wheezing   AKI vs CKD III  Cr 1.52 today, improved since discharge on 3/10.  Previous baseline of 0.8-1.0 in 2014, so possible that this is the patient's new baseline. If AKI likely cardiorenal.  -Will monitor with daily BMETs - Avoid nephrotoxic agents  Hyponatremia Hypokalemia  Secondary to hypervolemia. Potassium 3.4.  -Will continue diuresis with IV lasix 40 mg daily to improve volume status -will monitor daily with BMETs -Replace potassium as needed   HLD - Continue home atorvastatin 40mg  daily  Generalized anxiety disorder - Continue home xanax 1mg  daily PRN - Continue home fluoxetine 20mg  daily    Dispo: Anticipated discharge in approximately 2-3 day(s).   Melanee Spry, MD 05/26/2017, 11:54 AM Pager: [phone]

## 2017-05-26 NOTE — Progress Notes (Signed)
CRITICAL VALUE ALERT  Critical Value: troponin 0.09  Date & Time Notied: 05/26/17 0310  Provider Notified: IMTS  Orders Received/Actions taken: repeat troponin ordered for 0700

## 2017-05-26 NOTE — Consult Note (Addendum)
Cardiology Consult    Patient ID: Shawna Hawkins MRN: [id number], DOB/AGE: 05/22/1945   Admit date: 05/25/2017 Date of Consult: 05/26/2017  Primary Physician: Shela Leff, MD Primary Cardiologist: Dr. Marlou Porch Requesting Provider: Dr. Beryle Beams Reason for Consultation: elevated troponin last admission (left AMA)  Shawna Hawkins is a 72 y.o. female who is being seen today for the evaluation of elevated troponin at the request of Dr. Beryle Beams.   Patient Profile    72 yo female with PMH of COPD, anxiety, breast Ca with lumpectomy who was recently admitted and found to elevated troponins and new EF of 25%. Was recommended to have a cath but left AMA and returns with worsening shortness of breath.   Past Medical History   Past Medical History:  Diagnosis Date  . Anemia    as a young woman  . Anxiety   . Bronchitis   . COPD (chronic obstructive pulmonary disease) ([place])   . Depression   . Heat exhaustion   . HTN (hypertension), benign   . Hyperlipidemia     Past Surgical History:  Procedure Laterality Date  . BREAST LUMPECTOMY WITH NEEDLE LOCALIZATION AND AXILLARY SENTINEL LYMPH NODE BX Right 10/13/2012   Procedure: BREAST LUMPECTOMY WITH NEEDLE LOCALIZATION AND AXILLARY SENTINEL LYMPH NODE BX;  Surgeon: Rolm Bookbinder, MD;  Location: [facility];  Service: General;  Laterality: Right;  . CHOLECYSTECTOMY    . MULTIPLE TOOTH EXTRACTIONS    . POLYPECTOMY  2008  . TONSILLECTOMY       Allergies  Allergies  Allergen Reactions  . Codeine Sulfate Nausea Only  . Hydroxyzine Hcl   . Oxycodone-Acetaminophen Nausea Only  . Oxycodone-Aspirin Nausea Only  . Penicillins Itching    Has patient had a PCN reaction causing immediate rash, facial/tongue/throat swelling, SOB or lightheadedness with hypotension: unknown Has patient had a PCN reaction causing severe rash involving mucus membranes or skin necrosis: unknown Has patient had a PCN reaction that required hospitalization:  no Has patient had a PCN reaction occurring within the last 10 years: no If all of the above answers are "NO", then may proceed with Cephalosporin use.     History of Present Illness    Shawna Hawkins is a 72 yo female with PMH of  COPD, anxiety, breast Ca with lumpectomy.  She presented to the hospital on 05/21/17 with 2 weeks of progressive dyspnea on exertion plus body aches and decreased appetite.  CT at that time was negative for PE or pneumonia.  BNP was 4500, troponin peaked at 0.10.  EKG showed sinus tach with nonspecific T wave abnormalities.  She was admitted and underwent an echocardiogram which showed an EF of 25-30% with grade 1 diastolic dysfunction and moderate MR with peak PA pressure of 55 mmHg.  She was diuresed with IV Lasix, and cardiology was consulted regarding this new fall in her EF.  It was recommended that she undergo cardiac catheterization, but she was anxious to discharge from the hospital, and was concerned with staying over the weekend.  She ultimately left AGAINST MEDICAL ADVICE.  She presented back to the ER on 05/25/17 stating her breathing initially got better after leaving the hospital, but then her shortness of breath quickly returned.  Reported orthopnea, and productive cough.  On admission her labs showed sodium 128, creatinine 1.6, BNP greater than 4500, troponin 0.09>> 0.09 hemoglobin 13.6.  Chest x-ray showed concern for atelectasis versus mild pneumonia with cardiomegaly.  She was admitted from the ER and given 40 mg of IV  Lasix.  She was also started on antibiotics with concern for possible pneumonia by her primary team.  Cardiology is again consulted in regards to her acute heart failure. EKG on admission showed SR with diffuse TW abnormalities.   Inpatient Medications    . enoxaparin (LOVENOX) injection  20 mg Subcutaneous Q24H  . FLUoxetine  20 mg Oral Daily  . furosemide  40 mg Intravenous Daily  . potassium chloride  40 mEq Oral Once    Family History     Family History  Problem Relation Age of Onset  . Depression Mother   . Cancer Mother        lung    Social History    Social History   Socioeconomic History  . Marital status: Single    Spouse name: Not on file  . Number of children: Not on file  . Years of education: Not on file  . Highest education level: Not on file  Social Needs  . Financial resource strain: Not on file  . Food insecurity - worry: Not on file  . Food insecurity - inability: Not on file  . Transportation needs - medical: Not on file  . Transportation needs - non-medical: Not on file  Occupational History  . Not on file  Tobacco Use  . Smoking status: Current Every Day Smoker    Packs/day: 0.50    Years: 30.00    Pack years: 15.00    Types: Cigarettes  . Smokeless tobacco: Never Used  . Tobacco comment: "trying to quit"  Substance and Sexual Activity  . Alcohol use: No    Alcohol/week: 0.0 oz  . Drug use: No  . Sexual activity: Not on file  Other Topics Concern  . Not on file  Social History Narrative  . Not on file     Review of Systems    See HPI  All other systems reviewed and are otherwise negative except as noted above.  Physical Exam    Blood pressure 107/79, pulse 91, temperature 98 F (36.7 C), temperature source Oral, resp. rate (!) 24, height 5\' 3"  (1.6 m), weight 105 lb 2.6 oz (47.7 kg), last menstrual period 10/23/1982, SpO2 98 %.  General: Thin, WF appears older than stated age, NAD.  Psych: Normal affect. Neuro: Alert and oriented X 3. Moves all extremities spontaneously. HEENT: Normal  Neck: Supple without bruits or JVD. Lungs:  Resp regular and unlabored, CTA. Heart: RRR no s3, s4, or murmurs. Abdomen: Soft, non-tender, non-distended, BS + x 4.  Extremities: No clubbing, cyanosis or edema. DP/PT/Radials 2+ and equal bilaterally.   Labs    Troponin [facility] of Care Test) Recent Labs    05/25/17 2027  TROPIPOC 0.08   Recent Labs    05/26/17 0127 05/26/17 0945   TROPONINI 0.09* 0.09*   Lab Results  Component Value Date   WBC 15.8 (H) 05/26/2017   HGB 13.9 05/26/2017   HCT 40.8 05/26/2017   MCV 95.1 05/26/2017   PLT 142 (L) 05/26/2017    Recent Labs  Lab 05/21/17 0529  05/26/17 0308  NA 131*   < > 128*  K 3.2*   < > 3.4*  CL 91*   < > 91*  CO2 22   < > 23  BUN 16   < > 21*  CREATININE 1.59*   < > 1.52*  CALCIUM 9.3   < > 8.7*  PROT 7.8  --   --   BILITOT 1.1  --   --  ALKPHOS 80  --   --   ALT 30  --   --   AST 46*  --   --   GLUCOSE 228*   < > 113*   < > = values in this interval not displayed.   Lab Results  Component Value Date   CHOL 318 (H) 04/09/2015   HDL 34 (L) 04/09/2015   LDLCALC 232 (H) 04/09/2015   TRIG 262 (H) 04/09/2015   Lab Results  Component Value Date   DDIMER 8.75 (H) 05/25/2017     Radiology Studies    Dg Chest 2 View  Result Date: 05/25/2017 CLINICAL DATA:  Acute onset of central chest pain, productive cough, nausea and shortness of breath. EXAM: CHEST - 2 VIEW COMPARISON:  Chest radiograph performed 05/20/2017, and CTA of the chest performed 05/21/2017 FINDINGS: The lungs are well-aerated. Mild scarring is noted at the right lung base. Minimal left basilar opacity may reflect atelectasis or possibly mild pneumonia. There is no evidence of pleural effusion or pneumothorax. The heart is mildly enlarged. No acute osseous abnormalities are seen. Clips are noted at the right breast. IMPRESSION: Minimal left basilar airspace opacity may reflect atelectasis or possibly mild pneumonia. Mild cardiomegaly. Electronically Signed   By: Garald Balding M.D.   On: 05/25/2017 16:30   Dg Chest 2 View  Result Date: 05/20/2017 CLINICAL DATA:  Initial evaluation for acute shortness of breath with exertion. History of COPD. EXAM: CHEST - 2 VIEW COMPARISON:  Prior radiograph from 12/26/2009. FINDINGS: Extent to a shin of the cardiac silhouette due to AP technique. Mediastinal silhouette normal. Aortic atherosclerosis. Lungs  are hyperinflated with underlying COPD. Basilar predominant pulmonary interstitial edema. Bilateral pleural effusions. Superimposed bibasilar opacities may reflect edema and/or atelectasis. Infiltrates could be considered in the correct clinical setting. No pneumothorax. No acute osseous abnormality. IMPRESSION: 1. Basilar predominant pulmonary interstitial edema with small bilateral pleural effusions. Associated bibasilar opacities favored to reflect atelectasis and/or edema. Infectious infiltrates could be considered in the correct clinical setting. 2. Underlying COPD. 3. Aortic atherosclerosis. Electronically Signed   By: Jeannine Boga M.D.   On: 05/20/2017 22:52   Ct Angio Chest Pe W And/or Wo Contrast  Result Date: 05/25/2017 CLINICAL DATA:  72 year old female with chest pain and productive cough and shortness of breath. EXAM: CT ANGIOGRAPHY CHEST WITH CONTRAST TECHNIQUE: Multidetector CT imaging of the chest was performed using the standard protocol during bolus administration of intravenous contrast. Multiplanar CT image reconstructions and MIPs were obtained to evaluate the vascular anatomy. CONTRAST:  153mL ISOVUE-370 IOPAMIDOL (ISOVUE-370) INJECTION 76% COMPARISON:  Chest radiograph dated 05/25/2017 and CT dated 05/21/2017 FINDINGS: Cardiovascular: There is cardiomegaly with retrograde flow of contrast from the right atrium into the IVC consistent with right heart dysfunction. No pericardial effusion. There is moderate atherosclerotic calcification of the thoracic aorta. Evaluation of the aorta is very limited due to suboptimal enhancement and timing of the contrast. Thin linear hypodensity extending from the left main pulmonary artery into the left upper lobe pulmonary artery (series 7 images 103-110) likely artifactual and related to mixing artifact. Old nonocclusive thrombus/scarring is less likely. No acute or occlusive pulmonary artery embolus. Mediastinum/Nodes: Top-normal bilateral hilar  lymph nodes measure up to 10 mm. The esophagus and the thyroid gland are grossly unremarkable. No mediastinal fluid collection. Lungs/Pleura: Severe emphysema. There are small bilateral pleural effusions and associated partial compressive atelectasis of the lung bases. Superimposed pneumonia is not entirely excluded. Clinical correlation is recommended. There is mild diffuse interstitial  and interlobular septal thickening likely representing interstitial edema. There is no pneumothorax. Mucus content noted in the trachea. The central airways are otherwise patent. Upper Abdomen: There is a 3 cm aneurysmal dilatation of the upper abdominal aorta. Musculoskeletal: No chest wall abnormality. No acute or significant osseous findings. Review of the MIP images confirms the above findings. IMPRESSION: 1. No CT evidence of acute or occlusive pulmonary artery embolus. 2. Cardiomegaly with evidence of right heart dysfunction and findings of CHF including small bilateral pleural effusions. Clinical correlation is recommended. 3. Aortic Atherosclerosis (ICD10-I70.0) and Emphysema (ICD10-J43.9). Electronically Signed   By: Anner Crete M.D.   On: 05/25/2017 23:49   Ct Angio Chest Pe W/cm &/or Wo Cm  Result Date: 05/21/2017 CLINICAL DATA:  Severe shortness of breath with exertion EXAM: CT ANGIOGRAPHY CHEST WITH CONTRAST TECHNIQUE: Multidetector CT imaging of the chest was performed using the standard protocol during bolus administration of intravenous contrast. Multiplanar CT image reconstructions and MIPs were obtained to evaluate the vascular anatomy. CONTRAST:  58 mL Isovue 370 intravenous COMPARISON:  Chest x-ray 05/20/2017 FINDINGS: Cardiovascular: Satisfactory opacification of the pulmonary arteries to the segmental level. No evidence of pulmonary embolism. Reflux of contrast into the hepatic veins consistent with elevated right heart pressure. Cardiomegaly. No significant pericardial effusion. Moderate aortic  atherosclerosis. No aneurysmal dilatation. Mediastinum/Nodes: Midline trachea. No thyroid mass. No significantly enlarged lymph nodes. Esophagus within normal limits. Lungs/Pleura: Small right greater than left pleural effusions. Severe emphysema. Mild ground-glass density and septal thickening in the lower lungs consistent with edema. Upper Abdomen: Suspected aneurysmal dilatation of the proximal abdominal aorta up to 3.2 cm. Partially visualized mild hydronephrosis upper left kidney. Musculoskeletal: No chest wall abnormality. No acute or significant osseous findings. Review of the MIP images confirms the above findings. IMPRESSION: 1. Negative for acute pulmonary embolus. 2. Cardiomegaly with small right greater than left pleural effusions and hazy density in the bilateral lung bases with septal thickening suggesting pulmonary edema. Reflux of contrast into the hepatic veins suggesting elevated right heart pressure. 3. Severe emphysema 4. Suspected aneurysmal dilatation of the proximal abdominal aorta up to 3.2 cm. Recommend followup by ultrasound in 3 years. This recommendation follows ACR consensus guidelines: White Paper of the ACR Incidental Findings Committee II on Vascular Findings. J Am Coll Radiol 2013; 76:195-093 5. Possible mild hydronephrosis of partially visualized upper left kidney. Could more thoroughly evaluate with ultrasound. Aortic Atherosclerosis (ICD10-I70.0) and Emphysema (ICD10-J43.9). Electronically Signed   By: Donavan Foil M.D.   On: 05/21/2017 02:51    ECG & Cardiac Imaging    EKG:  The EKG was personally reviewed and demonstrates SR with diffuse TW abnormalities   Echo: 05/21/17  Study Conclusions  - Left ventricle: The cavity size was normal. Wall thickness was   increased in a pattern of mild LVH. Systolic function was   severely reduced. The estimated ejection fraction was in the   range of 25% to 30%. Diffuse hypokinesis. Doppler parameters are   consistent with  abnormal left ventricular relaxation (grade 1   diastolic dysfunction). - Aortic valve: Trileaflet. Sclerosis without stenosis. There was   no regurgitation. - Mitral valve: Thickening and reduced leaflet excursion of the   mitral leaflets wtih moderate regurgitation. - Left atrium: The atrium was mildly dilated. - Right ventricle: The cavity size was mildly dilated. Mildly   reduced systolic function. - Atrial septum: No defect or patent foramen ovale was identified. - Tricuspid valve: There was mild regurgitation. - Pulmonary arteries: PA  peak pressure: 55 mm Hg (S). - Inferior vena cava: The vessel was dilated. The respirophasic   diameter changes were blunted (< 50%), consistent with elevated   central venous pressure.  Impressions:  - LVEF 25-30%, global hypokinesis, mild LVH, grade 1 DD, markedly   elevated LV filling pressure, aortic valve sclerosis, moderate MR   with poor leaflet excursion, mild LAE, mild TR, RVSP 55 mmHg,   dilated IVC.  Assessment & Plan    72 yo female with PMH of COPD, anxiety, breast Ca with lumpectomy who was recently admitted and found to elevated troponins and new EF of 25%. Was recommended to have a cath but left AMA and returns with worsening shortness of breath.   1. Acute systolic HF: Noted during her admission last week. Cardiology was consulted last week and recommended that she stay over the weekend for cardiac cath on Monday. She left AMA, and presented back 3/12 with worsening shortness of breath. Diuresing with IV lasix though little UOP recorded. Does have CRFs including long hx of tobacco use and hyperlipidemia but reports she has been on medications intermittently. Discussed need for cardiac cath R/L to rule out/in CAD and she is agreeable at this time.  -- plan for cath in the am pending stable renal function. Of note she's had 2 CTAs within 4 days. Will need to be cautious with contrast for cath.  -- The patient understands that risks  included but are not limited to stroke (1 in 1000), death (1 in 43), kidney failure [usually temporary] (1 in 500), bleeding (1 in 200), allergic reaction [possibly serious] (1 in 200).  -- NPO at midnight  2. AKI vs CKD: unknown baseline but Cr is stable today at 1.52 based off last admission.   3. Hyponatremia: 128>>128. Suspect her volume overload could be playing a role in this.   4. HL: on statin.  -- check lipids in the am  5. ?CAP: was started on antibiotics but stopped this morning. Trending WBC  6. COPD/Tobacco use: Uses inhalers at home. Ongoing tobacco use.   Barnet Pall, NP-C Pager [phone] 05/26/2017, 12:33 PM   Agree with note by Reino Bellis NP-C  Ms. Wisser was readmitted with heart failure. She was recently in but when home prematurely prior to having a heart cath. She saw history of tobacco abuse as well as history of breast cancer but did not receive chemotherapy. EF 20% range. BNP is elevated. Her exam is benign. She's had increasing dyspnea more pronounced over the last several weeks. Plan is for right and left heart cath tomorrow morning.  Lorretta Harp, M.D., [place], [facility], [place] [address] [place], [place]  [id number]  [phone] 05/26/2017 1:45 PM

## 2017-05-26 NOTE — H&P (View-Only) (Signed)
Cardiology Consult    Patient ID: Shawna Hawkins MRN: [id number], DOB/AGE: Apr 08, 1945   Admit date: 05/25/2017 Date of Consult: 05/26/2017  Primary Physician: Shela Leff, MD Primary Cardiologist: Dr. Marlou Porch Requesting Provider: Dr. Beryle Beams Reason for Consultation: elevated troponin last admission (left AMA)  Shawna Hawkins is a 72 y.o. female who is being seen today for the evaluation of elevated troponin at the request of Dr. Beryle Beams.   Patient Profile    72 yo female with PMH of COPD, anxiety, breast Ca with lumpectomy who was recently admitted and found to elevated troponins and new EF of 25%. Was recommended to have a cath but left AMA and returns with worsening shortness of breath.   Past Medical History   Past Medical History:  Diagnosis Date  . Anemia    as a young woman  . Anxiety   . Bronchitis   . COPD (chronic obstructive pulmonary disease) ([place])   . Depression   . Heat exhaustion   . HTN (hypertension), benign   . Hyperlipidemia     Past Surgical History:  Procedure Laterality Date  . BREAST LUMPECTOMY WITH NEEDLE LOCALIZATION AND AXILLARY SENTINEL LYMPH NODE BX Right 10/13/2012   Procedure: BREAST LUMPECTOMY WITH NEEDLE LOCALIZATION AND AXILLARY SENTINEL LYMPH NODE BX;  Surgeon: Rolm Bookbinder, MD;  Location: [facility];  Service: General;  Laterality: Right;  . CHOLECYSTECTOMY    . MULTIPLE TOOTH EXTRACTIONS    . POLYPECTOMY  2008  . TONSILLECTOMY       Allergies  Allergies  Allergen Reactions  . Codeine Sulfate Nausea Only  . Hydroxyzine Hcl   . Oxycodone-Acetaminophen Nausea Only  . Oxycodone-Aspirin Nausea Only  . Penicillins Itching    Has patient had a PCN reaction causing immediate rash, facial/tongue/throat swelling, SOB or lightheadedness with hypotension: unknown Has patient had a PCN reaction causing severe rash involving mucus membranes or skin necrosis: unknown Has patient had a PCN reaction that required hospitalization:  no Has patient had a PCN reaction occurring within the last 10 years: no If all of the above answers are "NO", then may proceed with Cephalosporin use.     History of Present Illness    Shawna Hawkins is a 72 yo female with PMH of  COPD, anxiety, breast Ca with lumpectomy.  She presented to the hospital on 05/21/17 with 2 weeks of progressive dyspnea on exertion plus body aches and decreased appetite.  CT at that time was negative for PE or pneumonia.  BNP was 4500, troponin peaked at 0.10.  EKG showed sinus tach with nonspecific T wave abnormalities.  She was admitted and underwent an echocardiogram which showed an EF of 25-30% with grade 1 diastolic dysfunction and moderate MR with peak PA pressure of 55 mmHg.  She was diuresed with IV Lasix, and cardiology was consulted regarding this new fall in her EF.  It was recommended that she undergo cardiac catheterization, but she was anxious to discharge from the hospital, and was concerned with staying over the weekend.  She ultimately left AGAINST MEDICAL ADVICE.  She presented back to the ER on 05/25/17 stating her breathing initially got better after leaving the hospital, but then her shortness of breath quickly returned.  Reported orthopnea, and productive cough.  On admission her labs showed sodium 128, creatinine 1.6, BNP greater than 4500, troponin 0.09>> 0.09 hemoglobin 13.6.  Chest x-ray showed concern for atelectasis versus mild pneumonia with cardiomegaly.  She was admitted from the ER and given 40 mg of IV  Lasix.  She was also started on antibiotics with concern for possible pneumonia by her primary team.  Cardiology is again consulted in regards to her acute heart failure. EKG on admission showed SR with diffuse TW abnormalities.   Inpatient Medications    . enoxaparin (LOVENOX) injection  20 mg Subcutaneous Q24H  . FLUoxetine  20 mg Oral Daily  . furosemide  40 mg Intravenous Daily  . potassium chloride  40 mEq Oral Once    Family History     Family History  Problem Relation Age of Onset  . Depression Mother   . Cancer Mother        lung    Social History    Social History   Socioeconomic History  . Marital status: Single    Spouse name: Not on file  . Number of children: Not on file  . Years of education: Not on file  . Highest education level: Not on file  Social Needs  . Financial resource strain: Not on file  . Food insecurity - worry: Not on file  . Food insecurity - inability: Not on file  . Transportation needs - medical: Not on file  . Transportation needs - non-medical: Not on file  Occupational History  . Not on file  Tobacco Use  . Smoking status: Current Every Day Smoker    Packs/day: 0.50    Years: 30.00    Pack years: 15.00    Types: Cigarettes  . Smokeless tobacco: Never Used  . Tobacco comment: "trying to quit"  Substance and Sexual Activity  . Alcohol use: No    Alcohol/week: 0.0 oz  . Drug use: No  . Sexual activity: Not on file  Other Topics Concern  . Not on file  Social History Narrative  . Not on file     Review of Systems    See HPI  All other systems reviewed and are otherwise negative except as noted above.  Physical Exam    Blood pressure 107/79, pulse 91, temperature 98 F (36.7 C), temperature source Oral, resp. rate (!) 24, height 5\' 3"  (1.6 m), weight 105 lb 2.6 oz (47.7 kg), last menstrual period 10/23/1982, SpO2 98 %.  General: Thin, WF appears older than stated age, NAD.  Psych: Normal affect. Neuro: Alert and oriented X 3. Moves all extremities spontaneously. HEENT: Normal  Neck: Supple without bruits or JVD. Lungs:  Resp regular and unlabored, CTA. Heart: RRR no s3, s4, or murmurs. Abdomen: Soft, non-tender, non-distended, BS + x 4.  Extremities: No clubbing, cyanosis or edema. DP/PT/Radials 2+ and equal bilaterally.   Labs    Troponin [facility] Test) Recent Labs    05/25/17 2027  TROPIPOC 0.08   Recent Labs    05/26/17 0127 05/26/17 0945   TROPONINI 0.09* 0.09*   Lab Results  Component Value Date   WBC 15.8 (H) 05/26/2017   HGB 13.9 05/26/2017   HCT 40.8 05/26/2017   MCV 95.1 05/26/2017   PLT 142 (L) 05/26/2017    Recent Labs  Lab 05/21/17 0529  05/26/17 0308  NA 131*   < > 128*  K 3.2*   < > 3.4*  CL 91*   < > 91*  CO2 22   < > 23  BUN 16   < > 21*  CREATININE 1.59*   < > 1.52*  CALCIUM 9.3   < > 8.7*  PROT 7.8  --   --   BILITOT 1.1  --   --  ALKPHOS 80  --   --   ALT 30  --   --   AST 46*  --   --   GLUCOSE 228*   < > 113*   < > = values in this interval not displayed.   Lab Results  Component Value Date   CHOL 318 (H) 04/09/2015   HDL 34 (L) 04/09/2015   LDLCALC 232 (H) 04/09/2015   TRIG 262 (H) 04/09/2015   Lab Results  Component Value Date   DDIMER 8.75 (H) 05/25/2017     Radiology Studies    Dg Chest 2 View  Result Date: 05/25/2017 CLINICAL DATA:  Acute onset of central chest pain, productive cough, nausea and shortness of breath. EXAM: CHEST - 2 VIEW COMPARISON:  Chest radiograph performed 05/20/2017, and CTA of the chest performed 05/21/2017 FINDINGS: The lungs are well-aerated. Mild scarring is noted at the right lung base. Minimal left basilar opacity may reflect atelectasis or possibly mild pneumonia. There is no evidence of pleural effusion or pneumothorax. The heart is mildly enlarged. No acute osseous abnormalities are seen. Clips are noted at the right breast. IMPRESSION: Minimal left basilar airspace opacity may reflect atelectasis or possibly mild pneumonia. Mild cardiomegaly. Electronically Signed   By: Garald Balding M.D.   On: 05/25/2017 16:30   Dg Chest 2 View  Result Date: 05/20/2017 CLINICAL DATA:  Initial evaluation for acute shortness of breath with exertion. History of COPD. EXAM: CHEST - 2 VIEW COMPARISON:  Prior radiograph from 12/26/2009. FINDINGS: Extent to a shin of the cardiac silhouette due to AP technique. Mediastinal silhouette normal. Aortic atherosclerosis. Lungs  are hyperinflated with underlying COPD. Basilar predominant pulmonary interstitial edema. Bilateral pleural effusions. Superimposed bibasilar opacities may reflect edema and/or atelectasis. Infiltrates could be considered in the correct clinical setting. No pneumothorax. No acute osseous abnormality. IMPRESSION: 1. Basilar predominant pulmonary interstitial edema with small bilateral pleural effusions. Associated bibasilar opacities favored to reflect atelectasis and/or edema. Infectious infiltrates could be considered in the correct clinical setting. 2. Underlying COPD. 3. Aortic atherosclerosis. Electronically Signed   By: Jeannine Boga M.D.   On: 05/20/2017 22:52   Ct Angio Chest Pe W And/or Wo Contrast  Result Date: 05/25/2017 CLINICAL DATA:  72 year old female with chest pain and productive cough and shortness of breath. EXAM: CT ANGIOGRAPHY CHEST WITH CONTRAST TECHNIQUE: Multidetector CT imaging of the chest was performed using the standard protocol during bolus administration of intravenous contrast. Multiplanar CT image reconstructions and MIPs were obtained to evaluate the vascular anatomy. CONTRAST:  126mL ISOVUE-370 IOPAMIDOL (ISOVUE-370) INJECTION 76% COMPARISON:  Chest radiograph dated 05/25/2017 and CT dated 05/21/2017 FINDINGS: Cardiovascular: There is cardiomegaly with retrograde flow of contrast from the right atrium into the IVC consistent with right heart dysfunction. No pericardial effusion. There is moderate atherosclerotic calcification of the thoracic aorta. Evaluation of the aorta is very limited due to suboptimal enhancement and timing of the contrast. Thin linear hypodensity extending from the left main pulmonary artery into the left upper lobe pulmonary artery (series 7 images 103-110) likely artifactual and related to mixing artifact. Old nonocclusive thrombus/scarring is less likely. No acute or occlusive pulmonary artery embolus. Mediastinum/Nodes: Top-normal bilateral hilar  lymph nodes measure up to 10 mm. The esophagus and the thyroid gland are grossly unremarkable. No mediastinal fluid collection. Lungs/Pleura: Severe emphysema. There are small bilateral pleural effusions and associated partial compressive atelectasis of the lung bases. Superimposed pneumonia is not entirely excluded. Clinical correlation is recommended. There is mild diffuse interstitial  and interlobular septal thickening likely representing interstitial edema. There is no pneumothorax. Mucus content noted in the trachea. The central airways are otherwise patent. Upper Abdomen: There is a 3 cm aneurysmal dilatation of the upper abdominal aorta. Musculoskeletal: No chest wall abnormality. No acute or significant osseous findings. Review of the MIP images confirms the above findings. IMPRESSION: 1. No CT evidence of acute or occlusive pulmonary artery embolus. 2. Cardiomegaly with evidence of right heart dysfunction and findings of CHF including small bilateral pleural effusions. Clinical correlation is recommended. 3. Aortic Atherosclerosis (ICD10-I70.0) and Emphysema (ICD10-J43.9). Electronically Signed   By: Anner Crete M.D.   On: 05/25/2017 23:49   Ct Angio Chest Pe W/cm &/or Wo Cm  Result Date: 05/21/2017 CLINICAL DATA:  Severe shortness of breath with exertion EXAM: CT ANGIOGRAPHY CHEST WITH CONTRAST TECHNIQUE: Multidetector CT imaging of the chest was performed using the standard protocol during bolus administration of intravenous contrast. Multiplanar CT image reconstructions and MIPs were obtained to evaluate the vascular anatomy. CONTRAST:  58 mL Isovue 370 intravenous COMPARISON:  Chest x-ray 05/20/2017 FINDINGS: Cardiovascular: Satisfactory opacification of the pulmonary arteries to the segmental level. No evidence of pulmonary embolism. Reflux of contrast into the hepatic veins consistent with elevated right heart pressure. Cardiomegaly. No significant pericardial effusion. Moderate aortic  atherosclerosis. No aneurysmal dilatation. Mediastinum/Nodes: Midline trachea. No thyroid mass. No significantly enlarged lymph nodes. Esophagus within normal limits. Lungs/Pleura: Small right greater than left pleural effusions. Severe emphysema. Mild ground-glass density and septal thickening in the lower lungs consistent with edema. Upper Abdomen: Suspected aneurysmal dilatation of the proximal abdominal aorta up to 3.2 cm. Partially visualized mild hydronephrosis upper left kidney. Musculoskeletal: No chest wall abnormality. No acute or significant osseous findings. Review of the MIP images confirms the above findings. IMPRESSION: 1. Negative for acute pulmonary embolus. 2. Cardiomegaly with small right greater than left pleural effusions and hazy density in the bilateral lung bases with septal thickening suggesting pulmonary edema. Reflux of contrast into the hepatic veins suggesting elevated right heart pressure. 3. Severe emphysema 4. Suspected aneurysmal dilatation of the proximal abdominal aorta up to 3.2 cm. Recommend followup by ultrasound in 3 years. This recommendation follows ACR consensus guidelines: White Paper of the ACR Incidental Findings Committee II on Vascular Findings. J Am Coll Radiol 2013; 32:440-102 5. Possible mild hydronephrosis of partially visualized upper left kidney. Could more thoroughly evaluate with ultrasound. Aortic Atherosclerosis (ICD10-I70.0) and Emphysema (ICD10-J43.9). Electronically Signed   By: Donavan Foil M.D.   On: 05/21/2017 02:51    ECG & Cardiac Imaging    EKG:  The EKG was personally reviewed and demonstrates SR with diffuse TW abnormalities   Echo: 05/21/17  Study Conclusions  - Left ventricle: The cavity size was normal. Wall thickness was   increased in a pattern of mild LVH. Systolic function was   severely reduced. The estimated ejection fraction was in the   range of 25% to 30%. Diffuse hypokinesis. Doppler parameters are   consistent with  abnormal left ventricular relaxation (grade 1   diastolic dysfunction). - Aortic valve: Trileaflet. Sclerosis without stenosis. There was   no regurgitation. - Mitral valve: Thickening and reduced leaflet excursion of the   mitral leaflets wtih moderate regurgitation. - Left atrium: The atrium was mildly dilated. - Right ventricle: The cavity size was mildly dilated. Mildly   reduced systolic function. - Atrial septum: No defect or patent foramen ovale was identified. - Tricuspid valve: There was mild regurgitation. - Pulmonary arteries: PA  peak pressure: 55 mm Hg (S). - Inferior vena cava: The vessel was dilated. The respirophasic   diameter changes were blunted (< 50%), consistent with elevated   central venous pressure.  Impressions:  - LVEF 25-30%, global hypokinesis, mild LVH, grade 1 DD, markedly   elevated LV filling pressure, aortic valve sclerosis, moderate MR   with poor leaflet excursion, mild LAE, mild TR, RVSP 55 mmHg,   dilated IVC.  Assessment & Plan    72 yo female with PMH of COPD, anxiety, breast Ca with lumpectomy who was recently admitted and found to elevated troponins and new EF of 25%. Was recommended to have a cath but left AMA and returns with worsening shortness of breath.   1. Acute systolic HF: Noted during her admission last week. Cardiology was consulted last week and recommended that she stay over the weekend for cardiac cath on Monday. She left AMA, and presented back 3/12 with worsening shortness of breath. Diuresing with IV lasix though little UOP recorded. Does have CRFs including long hx of tobacco use and hyperlipidemia but reports she has been on medications intermittently. Discussed need for cardiac cath R/L to rule out/in CAD and she is agreeable at this time.  -- plan for cath in the am pending stable renal function. Of note she's had 2 CTAs within 4 days. Will need to be cautious with contrast for cath.  -- The patient understands that risks  included but are not limited to stroke (1 in 1000), death (1 in 56), kidney failure [usually temporary] (1 in 500), bleeding (1 in 200), allergic reaction [possibly serious] (1 in 200).  -- NPO at midnight  2. AKI vs CKD: unknown baseline but Cr is stable today at 1.52 based off last admission.   3. Hyponatremia: 128>>128. Suspect her volume overload could be playing a role in this.   4. HL: on statin.  -- check lipids in the am  5. ?CAP: was started on antibiotics but stopped this morning. Trending WBC  6. COPD/Tobacco use: Uses inhalers at home. Ongoing tobacco use.   Barnet Pall, NP-C Pager [phone] 05/26/2017, 12:33 PM   Agree with note by Reino Bellis NP-C  Shawna Hawkins was readmitted with heart failure. She was recently in but when home prematurely prior to having a heart cath. She saw history of tobacco abuse as well as history of breast cancer but did not receive chemotherapy. EF 20% range. BNP is elevated. Her exam is benign. She's had increasing dyspnea more pronounced over the last several weeks. Plan is for right and left heart cath tomorrow morning.  Lorretta Harp, M.D., [place], [facility], [place] [address] [place], [place]  [id number]  [phone] 05/26/2017 1:45 PM

## 2017-05-27 ENCOUNTER — Inpatient Hospital Stay (HOSPITAL_COMMUNITY)
Admission: EM | Disposition: A | Discharge status: Home Health | Payer: Self-pay | Source: Home / Self Care | Attending: Oncology

## 2017-05-27 ENCOUNTER — Encounter (HOSPITAL_COMMUNITY): Payer: Self-pay | Admitting: Cardiology

## 2017-05-27 DIAGNOSIS — L8990 Pressure ulcer of unspecified site, unspecified stage: Secondary | ICD-10-CM

## 2017-05-27 DIAGNOSIS — I10 Essential (primary) hypertension: Secondary | ICD-10-CM

## 2017-05-27 DIAGNOSIS — I878 Other specified disorders of veins: Secondary | ICD-10-CM

## 2017-05-27 DIAGNOSIS — Z7982 Long term (current) use of aspirin: Secondary | ICD-10-CM

## 2017-05-27 DIAGNOSIS — I2510 Atherosclerotic heart disease of native coronary artery without angina pectoris: Secondary | ICD-10-CM

## 2017-05-27 DIAGNOSIS — I5020 Unspecified systolic (congestive) heart failure: Secondary | ICD-10-CM

## 2017-05-27 DIAGNOSIS — E785 Hyperlipidemia, unspecified: Secondary | ICD-10-CM

## 2017-05-27 DIAGNOSIS — I5021 Acute systolic (congestive) heart failure: Secondary | ICD-10-CM

## 2017-05-27 HISTORY — PX: RIGHT/LEFT HEART CATH AND CORONARY ANGIOGRAPHY: CATH118266

## 2017-05-27 LAB — CBC
HEMATOCRIT: 39.5 % (ref 36.0–46.0)
Hemoglobin: 13.7 g/dL (ref 12.0–15.0)
MCH: 32.4 pg (ref 26.0–34.0)
MCHC: 34.7 g/dL (ref 30.0–36.0)
MCV: 93.4 fL (ref 78.0–100.0)
PLATELETS: 168 10*3/uL (ref 150–400)
RBC: 4.23 MIL/uL (ref 3.87–5.11)
RDW: 14 % (ref 11.5–15.5)
WBC: 16.5 10*3/uL — ABNORMAL HIGH (ref 4.0–10.5)

## 2017-05-27 LAB — POCT I-STAT 3, VENOUS BLOOD GAS (G3P V)
Acid-Base Excess: 6 mmol/L — ABNORMAL HIGH (ref 0.0–2.0)
Bicarbonate: 33 mmol/L — ABNORMAL HIGH (ref 20.0–28.0)
O2 Saturation: 62 %
PO2 VEN: 34 mmHg (ref 32.0–45.0)
TCO2: 35 mmol/L — AB (ref 22–32)
pCO2, Ven: 56 mmHg (ref 44.0–60.0)
pH, Ven: 7.378 (ref 7.250–7.430)

## 2017-05-27 LAB — BASIC METABOLIC PANEL
Anion gap: 14 (ref 5–15)
BUN: 23 mg/dL — AB (ref 6–20)
CHLORIDE: 87 mmol/L — AB (ref 101–111)
CO2: 26 mmol/L (ref 22–32)
CREATININE: 1.74 mg/dL — AB (ref 0.44–1.00)
Calcium: 8.6 mg/dL — ABNORMAL LOW (ref 8.9–10.3)
GFR calc Af Amer: 33 mL/min — ABNORMAL LOW (ref >60)
GFR calc non Af Amer: 29 mL/min — ABNORMAL LOW (ref >60)
Glucose, Bld: 113 mg/dL — ABNORMAL HIGH (ref 65–99)
Potassium: 3.7 mmol/L (ref 3.5–5.1)
Sodium: 127 mmol/L — ABNORMAL LOW (ref 135–145)

## 2017-05-27 LAB — POCT I-STAT 3, ART BLOOD GAS (G3+)
Acid-Base Excess: 4 mmol/L — ABNORMAL HIGH (ref 0.0–2.0)
BICARBONATE: 29.5 mmol/L — AB (ref 20.0–28.0)
O2 Saturation: 96 %
PH ART: 7.393 (ref 7.350–7.450)
PO2 ART: 86 mmHg (ref 83.0–108.0)
TCO2: 31 mmol/L (ref 22–32)
pCO2 arterial: 48.3 mmHg — ABNORMAL HIGH (ref 32.0–48.0)

## 2017-05-27 LAB — SURGICAL PCR SCREEN
MRSA, PCR: NEGATIVE
STAPHYLOCOCCUS AUREUS: NEGATIVE

## 2017-05-27 LAB — PROTIME-INR
INR: 1.13
Prothrombin Time: 14.4 seconds (ref 11.4–15.2)

## 2017-05-27 SURGERY — RIGHT/LEFT HEART CATH AND CORONARY ANGIOGRAPHY
Anesthesia: LOCAL

## 2017-05-27 MED ORDER — IPRATROPIUM-ALBUTEROL 0.5-2.5 (3) MG/3ML IN SOLN: 3.0000 mL | Freq: Four times a day (QID) | RESPIRATORY_TRACT | Status: DC | PRN

## 2017-05-27 MED ORDER — HEPARIN (PORCINE) IN NACL 2-0.9 UNIT/ML-% IJ SOLN
INTRAMUSCULAR | Status: AC
  Filled 2017-05-27: qty 1000

## 2017-05-27 MED ORDER — ASPIRIN 81 MG PO CHEW
81.0000 mg | CHEWABLE_TABLET | Freq: Every day | ORAL | Status: DC
  Administered 2017-05-27 – 2017-05-29 (×3): 81 mg via ORAL
  Filled 2017-05-27 (×4): qty 1

## 2017-05-27 MED ORDER — FENTANYL CITRATE (PF) 100 MCG/2ML IJ SOLN
INTRAMUSCULAR | Status: AC
  Filled 2017-05-27: qty 2

## 2017-05-27 MED ORDER — IPRATROPIUM-ALBUTEROL 0.5-2.5 (3) MG/3ML IN SOLN: 3.0000 mL | Freq: Four times a day (QID) | RESPIRATORY_TRACT | Status: DC

## 2017-05-27 MED ORDER — LIDOCAINE HCL (PF) 1 % IJ SOLN
INTRAMUSCULAR | Status: DC | PRN
  Administered 2017-05-27: 20 mL

## 2017-05-27 MED ORDER — ENOXAPARIN SODIUM 30 MG/0.3ML ~~LOC~~ SOLN
30.0000 mg | SUBCUTANEOUS | Status: DC
  Administered 2017-05-28 – 2017-05-29 (×2): 30 mg via SUBCUTANEOUS
  Filled 2017-05-27 (×2): qty 0.3

## 2017-05-27 MED ORDER — MUPIROCIN 2 % EX OINT
1.0000 "application " | TOPICAL_OINTMENT | Freq: Two times a day (BID) | CUTANEOUS | Status: AC
  Administered 2017-05-27 – 2017-05-29 (×5): 1 via NASAL
  Filled 2017-05-27 (×2): qty 22

## 2017-05-27 MED ORDER — MIDAZOLAM HCL 2 MG/2ML IJ SOLN
INTRAMUSCULAR | Status: AC
  Filled 2017-05-27: qty 2

## 2017-05-27 MED ORDER — FENTANYL CITRATE (PF) 100 MCG/2ML IJ SOLN
INTRAMUSCULAR | Status: DC | PRN
  Administered 2017-05-27 (×2): 25 ug via INTRAVENOUS

## 2017-05-27 MED ORDER — SODIUM CHLORIDE 0.9 % IV SOLN: 250.0000 mL | INTRAVENOUS | Status: DC | PRN

## 2017-05-27 MED ORDER — FUROSEMIDE 10 MG/ML IJ SOLN
40.0000 mg | Freq: Every day | INTRAMUSCULAR | Status: DC
  Administered 2017-05-28 – 2017-05-29 (×2): 40 mg via INTRAVENOUS
  Filled 2017-05-27 (×2): qty 4

## 2017-05-27 MED ORDER — HEPARIN (PORCINE) IN NACL 2-0.9 UNIT/ML-% IJ SOLN
INTRAMUSCULAR | Status: AC | PRN
  Administered 2017-05-27 (×2): 500 mL via INTRA_ARTERIAL

## 2017-05-27 MED ORDER — LIDOCAINE HCL (PF) 1 % IJ SOLN
INTRAMUSCULAR | Status: AC
  Filled 2017-05-27: qty 30

## 2017-05-27 MED ORDER — MIDAZOLAM HCL 2 MG/2ML IJ SOLN
INTRAMUSCULAR | Status: DC | PRN
  Administered 2017-05-27: 1 mg via INTRAVENOUS

## 2017-05-27 MED ORDER — SODIUM CHLORIDE 0.9 % WEIGHT BASED INFUSION
1.0000 mL/kg/h | INTRAVENOUS | Status: DC
  Administered 2017-05-27: 1 mL/kg/h via INTRAVENOUS

## 2017-05-27 MED ORDER — IOPAMIDOL (ISOVUE-370) INJECTION 76%
INTRAVENOUS | Status: AC
  Filled 2017-05-27: qty 100

## 2017-05-27 MED ORDER — ATORVASTATIN CALCIUM 40 MG PO TABS
40.0000 mg | ORAL_TABLET | Freq: Every day | ORAL | Status: DC
  Administered 2017-05-27 – 2017-05-28 (×2): 40 mg via ORAL
  Filled 2017-05-27 (×2): qty 1

## 2017-05-27 MED ORDER — IOPAMIDOL (ISOVUE-370) INJECTION 76%
INTRAVENOUS | Status: DC | PRN
  Administered 2017-05-27: 30 mL via INTRA_ARTERIAL

## 2017-05-27 MED ORDER — SODIUM CHLORIDE 0.9 % WEIGHT BASED INFUSION
1.0000 mL/kg/h | INTRAVENOUS | Status: AC
  Administered 2017-05-27 (×2): 1 mL/kg/h via INTRAVENOUS

## 2017-05-27 MED ORDER — SODIUM CHLORIDE 0.9 % WEIGHT BASED INFUSION
3.0000 mL/kg/h | INTRAVENOUS | Status: DC
  Administered 2017-05-27: 3 mL/kg/h via INTRAVENOUS

## 2017-05-27 MED ORDER — SODIUM CHLORIDE 0.9% FLUSH: 3.0000 mL | INTRAVENOUS | Status: DC | PRN

## 2017-05-27 MED ORDER — SODIUM CHLORIDE 0.9% FLUSH
3.0000 mL | Freq: Two times a day (BID) | INTRAVENOUS | Status: DC
  Administered 2017-05-28 – 2017-05-29 (×3): 3 mL via INTRAVENOUS

## 2017-05-27 SURGICAL SUPPLY — 12 items
CATH INFINITI 5FR MULTPACK ANG (CATHETERS) ×1 IMPLANT
CATH SWAN GANZ 7F STRAIGHT (CATHETERS) ×1 IMPLANT
COVER PRB 48X5XTLSCP FOLD TPE (BAG) IMPLANT
COVER PROBE 5X48 (BAG) ×2
KIT HEART LEFT (KITS) ×2 IMPLANT
PACK CARDIAC CATHETERIZATION (CUSTOM PROCEDURE TRAY) ×2 IMPLANT
SHEATH AVANTI 11CM 5FR (SHEATH) ×1 IMPLANT
SHEATH AVANTI 11CM 7FR (SHEATH) ×1 IMPLANT
TRANSDUCER W/STOPCOCK (MISCELLANEOUS) ×2 IMPLANT
WIRE EMERALD 3MM-J .025X260CM (WIRE) ×1 IMPLANT
WIRE EMERALD 3MM-J .035X150CM (WIRE) ×1 IMPLANT
WIRE HI TORQ VERSACORE-J 145CM (WIRE) ×1 IMPLANT

## 2017-05-27 NOTE — Progress Notes (Addendum)
Progress Note  Patient Name: Kasiya Burck Date of Encounter: 05/27/2017  Primary Cardiologist: Candee Furbish, MD  Subjective   No chest pain, and breathing seems somewhat better today.   Inpatient Medications    Scheduled Meds: . enoxaparin (LOVENOX) injection  20 mg Subcutaneous Q24H  . FLUoxetine  20 mg Oral Daily  . furosemide  40 mg Intravenous Daily  . mupirocin ointment  1 application Nasal BID  . sodium chloride flush  3 mL Intravenous Q12H   Continuous Infusions: . sodium chloride    . sodium chloride     PRN Meds: sodium chloride, acetaminophen **OR** acetaminophen, ALPRAZolam, ipratropium-albuterol, ondansetron (ZOFRAN) IV **OR** ondansetron, senna-docusate, sodium chloride flush   Vital Signs    Vitals:   05/26/17 1523 05/26/17 2100 05/26/17 2111 05/27/17 0500  BP: (!) 131/98 125/65  118/62  Pulse: (!) 101 95  92  Resp: (!) 22 18  16   Temp: 98.7 F (37.1 C) 98.6 F (37 C)  98.2 F (36.8 C)  TempSrc: Oral Oral  Oral  SpO2: 93% 97%    Weight:   98 lb 15.8 oz (44.9 kg) 100 lb 8.5 oz (45.6 kg)  Height:        Intake/Output Summary (Last 24 hours) at 05/27/2017 0907 Last data filed at 05/27/2017 0541 Gross per 24 hour  Intake 538 ml  Output 1550 ml  Net -1012 ml   Filed Weights   05/26/17 0500 05/26/17 2111 05/27/17 0500  Weight: 105 lb 2.6 oz (47.7 kg) 98 lb 15.8 oz (44.9 kg) 100 lb 8.5 oz (45.6 kg)    Telemetry    SR - Personally Reviewed  ECG    N/a - Personally Reviewed  Physical Exam   General: Thin ill appearing older than stated age female appearing in no acute distress. Head: Normocephalic, atraumatic.  Neck: Supple without bruits, JVD. Lungs:  Resp regular and unlabored, CTA. Heart: RRR, S1, S2, no S3, S4, or murmur; no rub. Abdomen: Soft, non-tender, non-distended with normoactive bowel sounds.  Extremities: No clubbing, cyanosis, edema. Distal pedal pulses are 2+ bilaterally. Neuro: Alert and oriented X 3. Moves all extremities  spontaneously. Psych: Normal affect.  Labs    Chemistry Recent Labs  Lab 05/21/17 0529  05/25/17 1605 05/26/17 0308 05/27/17 0159  NA 131*   < > 128* 128* 127*  K 3.2*   < > 3.7 3.4* 3.7  CL 91*   < > 91* 91* 87*  CO2 22   < > 23 23 26   GLUCOSE 228*   < > 139* 113* 113*  BUN 16   < > 20 21* 23*  CREATININE 1.59*   < > 1.60* 1.52* 1.74*  CALCIUM 9.3   < > 8.7* 8.7* 8.6*  PROT 7.8  --   --   --   --   ALBUMIN 4.1  --   --   --   --   AST 46*  --   --   --   --   ALT 30  --   --   --   --   ALKPHOS 80  --   --   --   --   BILITOT 1.1  --   --   --   --   GFRNONAA 32*   < > 32* 34* 29*  GFRAA 37*   < > 37* 39* 33*  ANIONGAP 18*   < > 14 14 14    < > = values in this interval  not displayed.     Hematology Recent Labs  Lab 05/25/17 1605 05/26/17 0308 05/27/17 0159  WBC 16.3* 15.8* 16.5*  RBC 4.18 4.29 4.23  HGB 13.6 13.9 13.7  HCT 39.5 40.8 39.5  MCV 94.5 95.1 93.4  MCH 32.5 32.4 32.4  MCHC 34.4 34.1 34.7  RDW 14.0 14.3 14.0  PLT 174 142* 168    Cardiac Enzymes Recent Labs  Lab 05/21/17 1149 05/21/17 1854 05/26/17 0127 05/26/17 0945  TROPONINI 0.09* 0.10* 0.09* 0.09*    Recent Labs  Lab 05/20/17 2342 05/25/17 1632 05/25/17 2027  TROPIPOC 0.07 0.05 0.08     BNP Recent Labs  Lab 05/20/17 2326 05/25/17 1605  BNP >4,500.0* >4,500.0*     DDimer  Recent Labs  Lab 05/25/17 1605  DDIMER 8.75*      Radiology    Dg Chest 2 View  Result Date: 05/25/2017 CLINICAL DATA:  Acute onset of central chest pain, productive cough, nausea and shortness of breath. EXAM: CHEST - 2 VIEW COMPARISON:  Chest radiograph performed 05/20/2017, and CTA of the chest performed 05/21/2017 FINDINGS: The lungs are well-aerated. Mild scarring is noted at the right lung base. Minimal left basilar opacity may reflect atelectasis or possibly mild pneumonia. There is no evidence of pleural effusion or pneumothorax. The heart is mildly enlarged. No acute osseous abnormalities are  seen. Clips are noted at the right breast. IMPRESSION: Minimal left basilar airspace opacity may reflect atelectasis or possibly mild pneumonia. Mild cardiomegaly. Electronically Signed   By: Garald Balding M.D.   On: 05/25/2017 16:30   Ct Angio Chest Pe W And/or Wo Contrast  Result Date: 05/25/2017 CLINICAL DATA:  72 year old female with chest pain and productive cough and shortness of breath. EXAM: CT ANGIOGRAPHY CHEST WITH CONTRAST TECHNIQUE: Multidetector CT imaging of the chest was performed using the standard protocol during bolus administration of intravenous contrast. Multiplanar CT image reconstructions and MIPs were obtained to evaluate the vascular anatomy. CONTRAST:  117mL ISOVUE-370 IOPAMIDOL (ISOVUE-370) INJECTION 76% COMPARISON:  Chest radiograph dated 05/25/2017 and CT dated 05/21/2017 FINDINGS: Cardiovascular: There is cardiomegaly with retrograde flow of contrast from the right atrium into the IVC consistent with right heart dysfunction. No pericardial effusion. There is moderate atherosclerotic calcification of the thoracic aorta. Evaluation of the aorta is very limited due to suboptimal enhancement and timing of the contrast. Thin linear hypodensity extending from the left main pulmonary artery into the left upper lobe pulmonary artery (series 7 images 103-110) likely artifactual and related to mixing artifact. Old nonocclusive thrombus/scarring is less likely. No acute or occlusive pulmonary artery embolus. Mediastinum/Nodes: Top-normal bilateral hilar lymph nodes measure up to 10 mm. The esophagus and the thyroid gland are grossly unremarkable. No mediastinal fluid collection. Lungs/Pleura: Severe emphysema. There are small bilateral pleural effusions and associated partial compressive atelectasis of the lung bases. Superimposed pneumonia is not entirely excluded. Clinical correlation is recommended. There is mild diffuse interstitial and interlobular septal thickening likely representing  interstitial edema. There is no pneumothorax. Mucus content noted in the trachea. The central airways are otherwise patent. Upper Abdomen: There is a 3 cm aneurysmal dilatation of the upper abdominal aorta. Musculoskeletal: No chest wall abnormality. No acute or significant osseous findings. Review of the MIP images confirms the above findings. IMPRESSION: 1. No CT evidence of acute or occlusive pulmonary artery embolus. 2. Cardiomegaly with evidence of right heart dysfunction and findings of CHF including small bilateral pleural effusions. Clinical correlation is recommended. 3. Aortic Atherosclerosis (ICD10-I70.0) and Emphysema (ICD10-J43.9).  Electronically Signed   By: Anner Crete M.D.   On: 05/25/2017 23:49    Cardiac Studies   N/a   Patient Profile     72 y.o. female COPD, anxiety, breast Ca with lumpectomy who was recently admitted and found to elevated troponins and new EF of 25%. Was recommended to have a cath but left AMA and returns with worsening shortness of breath. Cardiology consulted regarding plans for cath.   Assessment & Plan    1. Acute systolic HF: Noted during her admission last week. Cardiology was consulted last week and recommended that she stay over the weekend for cardiac cath on Monday. She left AMA, and presented back 3/12 with worsening shortness of breath. Does have CRFs including long hx of tobacco use and hyperlipidemia but reports she has been on medications intermittently. -- better UOP overnight with 1.9L out. Lasix held this morning. -- plan for cath today though Cr increased from 1.5>>1.7 today. Of note she's had 2 CTAs within 4 days. Will need to be cautious with contrast for cath.   2. AKI vs CKD: unknown baseline but Cr increased from 1.52>> 1.7 today. Getting 50cc/hr until cath.   3. Hyponatremia: 128>>128. Suspect her volume overload could be playing a role in this.   4. HL: on statin.  -- check lipids in the am  5. ?CAP: was started on  antibiotics but and felt to be 2/2 to COPD excerebration. Afebrile, WBC trending up. Management per primary  6. COPD/Tobacco use: Uses inhalers at home. Ongoing tobacco use.   Signed, Reino Bellis, NP  05/27/2017, 9:07 AM  Pager # [phone]   For questions or updates, please contact [place] Please consult [email or web address] for contact info under Cardiology/STEMI.   Agree with note by Reino Bellis NP-C  Pt for cath this AM. Await results prior to making further recommendations  Lorretta Harp, M.D., [place], [facility], [place]. [place], [place]  [address]  [phone] 05/27/2017 10:22 AM

## 2017-05-27 NOTE — Care Management Note (Addendum)
Case Management Note  Patient Details  Name: Shawna Hawkins MRN: [id number] Date of Birth: Apr 01, 1945  Subjective/Objective:  From home alone, pta indep,  Presents with CHF, she did not use any DME at home.  She has PCP and she has medication coverage.  Medicare ride transport to [place] [facility].  She states she also can use [facility], but she states she will have a ride home at discharge.  She is s/p cath today.  Patient is interested in [facility] services, NCM offered choice from [facility], she states she would like for me to try [facility] if she needs [facility] services at discharge.  Await PT eval.    3/15 1448 Tomi Bamberger RN, BSN - NCM spoke with Orrin Brigham and Tommi Rumps with [facility] about [facility] program for patient, she is a candidate and Tommi Rumps spoke with her will plan for [facility] First Program for patient will need HHRN, PT, Aide, OT and Social Work orders.                  Action/Plan: NCM will follow for transition of care needs.   Expected Discharge Date:                  Expected Discharge Plan:  Home/Self Care  In-House Referral:     Discharge planning Services  CM Consult  Post Acute Care Choice:    Choice offered to:     DME Arranged:    DME Agency:     HH Arranged:    HH Agency:     Status of Service:  In process, will continue to follow  If discussed at Long Length of Stay Meetings, dates discussed:    Additional Comments:  Zenon Mayo, RN 05/27/2017, 12:23 PM

## 2017-05-27 NOTE — Progress Notes (Signed)
PT Cancellation Note  Patient Details Name: Shawna Hawkins MRN: [id number] DOB: 08-Jun-1945   Cancelled Treatment:    Reason Eval/Treat Not Completed: Medical issues which prohibited therapy(Pt being taken to CATH lab on arrival to room. ) Will likely have to be evaluated tomorrow as pt will be on bedrest s/p cath.  Thanks.   Denice Paradise 05/27/2017, 9:38 AM [facility] [phone] (pager)

## 2017-05-27 NOTE — Progress Notes (Signed)
Medicine attending: I examined this patient today following a cardiac catheterization along with resident physician Dr. Rochele Pages and I concur with her evaluation and management plan which we discussed together. She had uncomplicated cardiac cath via a right femoral approach.  Findings as noted by Dr. Annabelle Harman gross.  Noncritical stenosis of 2 vessels.  Medical management recommended. We will hold diuretics today in view of contrast load.  Resume tomorrow. Lungs are clear.  Regular cardiac rhythm.  Persistent JVD.  No peripheral edema. Impression: New-onset pulmonary edema with findings of advanced systolic cardiac dysfunction subacute MI versus hypertensive cardiomyopathy. We will design a medication regimen for her prior to discharge.  She needs optimal blood pressure control which should include a diuretic.

## 2017-05-27 NOTE — Progress Notes (Signed)
Order in chart to place 20 G NSL IV in R AC in preparation for right/left heart cath. Pt with restricted right side r/t history of right lumpectomy. Pt states that they also had two lymph nodes removed from their right side and that they were told to keep their right arm restricted. A 22 G PIV was able to be placed as a secondary IV in the patients left arm. No new IVs will be placed overnight in patients right arm without confirmation from the ordering provider to use restricted limb.

## 2017-05-27 NOTE — Interval H&P Note (Signed)
History and Physical Interval Note:  05/27/2017 9:58 AM  Shawna Hawkins  has presented today for surgery, with the diagnosis of hf  The various methods of treatment have been discussed with the patient and family. After consideration of risks, benefits and other options for treatment, the patient has consented to  Procedure(s): RIGHT/LEFT HEART CATH AND CORONARY ANGIOGRAPHY (N/A) as a surgical intervention .  The patient's history has been reviewed, patient examined, no change in status, stable for surgery.  I have reviewed the patient's chart and labs.  Questions were answered to the patient's satisfaction.     Collier Salina [facility] 05/27/2017 9:58 AM

## 2017-05-27 NOTE — Progress Notes (Signed)
Patient was compliant with keeping her right leg straight following cardiac cath today via right femoral approach. Site remained soft with no s/s of hematoma. Per orders, pressure bandage was replaced with a bandaid this evening. Site looked slightly bruised, but no swelling or other complications noted. Patient denies pain.

## 2017-05-27 NOTE — Progress Notes (Signed)
   Subjective:  Patient seen and examined. She tolerated the LHC well. States she is still short of breath when walking to the bathroom. Says she has just felt generally unwell for quite sometime.   Objective:  Vital signs in last 24 hours: Vitals:   05/27/17 1040 05/27/17 1054 05/27/17 1102 05/27/17 1227  BP: (!) 142/95 (!) 153/92 (!) 159/105 (!) 149/118  Pulse: 81 84 91 94  Resp: 12   (!) 23  Temp:    98.2 F (36.8 C)  TempSrc:    Oral  SpO2: 96% 92%  97%  Weight:      Height:       General: Laying in bed comfortably, NAD HEENT: [place]/AT, no scleral icterus Cardiac: RRR, No R/M/G appreciated Pulm: normal effort, no respiratory distress; lungs CTAB Abd: soft, non tender, non distended, BS normal Ext: extremities well perfused, no peripheral edema; right groin bandage dry and intact Neuro: alert and oriented X3, cranial nerves II-XII grossly intact   Assessment/Plan:  Active Problems:   CHF (congestive heart failure) (HCC)   HTN (hypertension), benign   Secondary cardiomyopathy (HCC)   Pressure injury of skin  Acute Systolic and Diastolic Heart Failure Patient presented with DOE and orthopnea. CXR with evidence of pulmonary edema and bilateral pleural effusions. ECHO on 3/8 revealed LVEF 25-30%, global hypokinesis, mild LVH, and grade 1 DD. Output of about 2 liters in past 24 hours. Euvolemic on examination. LHC revealed nonobstructive CAD, mild pulmonary hypertension and recommended medical management.  -Will continue with IV Lasix 40 mg daily - will resume tomorrow as patient received contrast load and had elevated creatinine today -Strict I/Os -Daily weights -will monitor creatinine and lytes with daily bmets   Nonobstructive CAD Prox LAD lesion is 30% stenosed; Prox Cx lesion is 55% stenosed. Cards recommends medical management.  -ASA 81 mg daily -Continue atorvastatin 40mg  daily  COPD ?CAP CXR with minimal left basilar opacity that may reflect atelectasis or  possibly mild pneumonia. Patient is afebrile with stable vital signs. WBC 15.8>>16.5.  -will continue to monitor vitals and trend elevated WBC  -will continue duonebs q6 for SOB or wheezing   AKI vs CKD III  Cr 1.74 today. Previous baseline of 0.8-1.0 in 2014, so possible that this is the patient's new baseline. If AKI likely cardiorenal. Acutely elevated after receiving contrast from CTA, also receiving contrast for LHC today. -Will monitor with daily BMETs -Avoid nephrotoxic agents -Will hold lasix today  Hyponatremia Secondary to hypervolemia.  -Will continue diuresis with IV lasix 40 mg daily to improve volume status -will monitor daily with BMETs  Generalized anxiety disorder - Continue home xanax 1mg  daily PRN - Continue home fluoxetine 20mg  daily  DVT ppx: lovenox  Dispo: Anticipated discharge in approximately 1-2 day(s).   Melanee Spry, MD 05/27/2017, 2:00 PM Pager: [phone]

## 2017-05-28 DIAGNOSIS — K219 Gastro-esophageal reflux disease without esophagitis: Secondary | ICD-10-CM

## 2017-05-28 LAB — BASIC METABOLIC PANEL
Anion gap: 13 (ref 5–15)
BUN: 23 mg/dL — AB (ref 6–20)
CO2: 25 mmol/L (ref 22–32)
CREATININE: 1.36 mg/dL — AB (ref 0.44–1.00)
Calcium: 8.3 mg/dL — ABNORMAL LOW (ref 8.9–10.3)
Chloride: 89 mmol/L — ABNORMAL LOW (ref 101–111)
GFR, EST AFRICAN AMERICAN: 45 mL/min — AB (ref >60)
GFR, EST NON AFRICAN AMERICAN: 39 mL/min — AB (ref >60)
Glucose, Bld: 110 mg/dL — ABNORMAL HIGH (ref 65–99)
Potassium: 3.6 mmol/L (ref 3.5–5.1)
SODIUM: 127 mmol/L — AB (ref 135–145)

## 2017-05-28 LAB — CBC
HCT: 35.2 % — ABNORMAL LOW (ref 36.0–46.0)
Hemoglobin: 12.1 g/dL (ref 12.0–15.0)
MCH: 31.8 pg (ref 26.0–34.0)
MCHC: 34.4 g/dL (ref 30.0–36.0)
MCV: 92.4 fL (ref 78.0–100.0)
PLATELETS: 183 10*3/uL (ref 150–400)
RBC: 3.81 MIL/uL — AB (ref 3.87–5.11)
RDW: 14 % (ref 11.5–15.5)
WBC: 15.5 10*3/uL — AB (ref 4.0–10.5)

## 2017-05-28 MED ORDER — PANTOPRAZOLE SODIUM 40 MG PO TBEC
40.0000 mg | DELAYED_RELEASE_TABLET | Freq: Every day | ORAL | Status: DC
  Administered 2017-05-28 – 2017-05-29 (×2): 40 mg via ORAL
  Filled 2017-05-28 (×2): qty 1

## 2017-05-28 MED ORDER — CARVEDILOL 3.125 MG PO TABS
3.1250 mg | ORAL_TABLET | Freq: Two times a day (BID) | ORAL | Status: DC
  Administered 2017-05-28 – 2017-05-29 (×3): 3.125 mg via ORAL
  Filled 2017-05-28 (×3): qty 1

## 2017-05-28 MED ORDER — ALUM & MAG HYDROXIDE-SIMETH 200-200-20 MG/5ML PO SUSP
15.0000 mL | Freq: Four times a day (QID) | ORAL | Status: DC | PRN
  Administered 2017-05-29: 15 mL via ORAL
  Filled 2017-05-28: qty 30

## 2017-05-28 MED FILL — Heparin Sodium (Porcine) 2 Unit/ML in Sodium Chloride 0.9%: INTRAMUSCULAR | Qty: 1000 | Status: AC

## 2017-05-28 NOTE — Consult Note (Addendum)
Progress Note  Patient Name: Shawna Hawkins Date of Encounter: 05/28/2017  Primary Cardiologist: Candee Furbish, MD  Subjective   No chest pain, having some GERD symptoms this morning.   Inpatient Medications    Scheduled Meds: . aspirin  81 mg Oral Daily  . atorvastatin  40 mg Oral q1800  . enoxaparin (LOVENOX) injection  30 mg Subcutaneous Q24H  . FLUoxetine  20 mg Oral Daily  . furosemide  40 mg Intravenous Daily  . mupirocin ointment  1 application Nasal BID  . pantoprazole  40 mg Oral Daily  . sodium chloride flush  3 mL Intravenous Q12H   Continuous Infusions: . sodium chloride     PRN Meds: sodium chloride, acetaminophen **OR** acetaminophen, ALPRAZolam, alum & mag hydroxide-simeth, ipratropium-albuterol, ondansetron (ZOFRAN) IV **OR** ondansetron, senna-docusate, sodium chloride flush   Vital Signs    Vitals:   05/27/17 1227 05/27/17 1400 05/27/17 2100 05/28/17 0537  BP: (!) 149/118 138/87 124/86 138/81  Pulse: 94 90 94 89  Resp: (!) 23 20 16 18   Temp: 98.2 F (36.8 C) 98.1 F (36.7 C) 98.2 F (36.8 C) 98 F (36.7 C)  TempSrc: Oral Oral Oral Oral  SpO2: 97% 96% 100% 100%  Weight:    100 lb 15.5 oz (45.8 kg)  Height:        Intake/Output Summary (Last 24 hours) at 05/28/2017 0936 Last data filed at 05/28/2017 0751 Gross per 24 hour  Intake 894.8 ml  Output 600 ml  Net 294.8 ml   Filed Weights   05/26/17 2111 05/27/17 0500 05/28/17 0537  Weight: 98 lb 15.8 oz (44.9 kg) 100 lb 8.5 oz (45.6 kg) 100 lb 15.5 oz (45.8 kg)    Telemetry    SR - Personally Reviewed  ECG    N/a - Personally Reviewed  Physical Exam   General: Thin older W female appearing in no acute distress. Head: Normocephalic, atraumatic.  Neck: Supple without bruits, JVD. Lungs:  Resp regular and unlabored, diminished in bases. Heart: RRR, S1, S2, no S3, S4, or murmur; no rub. Abdomen: Soft, non-tender, non-distended with normoactive bowel sounds.  Extremities: No clubbing,  cyanosis, edema. Distal pedal pulses are 2+ bilaterally. Right femoral cath site stable with bruising.  Neuro: Alert and oriented X 3. Moves all extremities spontaneously. Psych: Normal affect.  Labs    Chemistry Recent Labs  Lab 05/26/17 0308 05/27/17 0159 05/28/17 0307  NA 128* 127* 127*  K 3.4* 3.7 3.6  CL 91* 87* 89*  CO2 23 26 25   GLUCOSE 113* 113* 110*  BUN 21* 23* 23*  CREATININE 1.52* 1.74* 1.36*  CALCIUM 8.7* 8.6* 8.3*  GFRNONAA 34* 29* 39*  GFRAA 39* 33* 45*  ANIONGAP 14 14 13      Hematology Recent Labs  Lab 05/26/17 0308 05/27/17 0159 05/28/17 0307  WBC 15.8* 16.5* 15.5*  RBC 4.29 4.23 3.81*  HGB 13.9 13.7 12.1  HCT 40.8 39.5 35.2*  MCV 95.1 93.4 92.4  MCH 32.4 32.4 31.8  MCHC 34.1 34.7 34.4  RDW 14.3 14.0 14.0  PLT 142* 168 183    Cardiac Enzymes Recent Labs  Lab 05/21/17 1149 05/21/17 1854 05/26/17 0127 05/26/17 0945  TROPONINI 0.09* 0.10* 0.09* 0.09*    Recent Labs  Lab 05/25/17 1632 05/25/17 2027  TROPIPOC 0.05 0.08     BNP Recent Labs  Lab 05/25/17 1605  BNP >4,500.0*     DDimer  Recent Labs  Lab 05/25/17 1605  DDIMER 8.75*    Radiology  No results found.  Cardiac Studies   Cath: 05/27/17  Conclusion     Prox LAD lesion is 30% stenosed.  Prox Cx lesion is 55% stenosed.  Hemodynamic findings consistent with mild pulmonary hypertension.  LV end diastolic pressure is normal.   1. Nonobstructive CAD. Left dominant circulation 2. Mild pulmonary HTN 3. Normal LV filling pressures.   Plan: medical management.   Patient Profile     72 y.o. female COPD, anxiety, breast Ca with lumpectomy who was recently admitted and found to elevated troponins and new EF of 25%. Was recommended to have a cath but left AMA and returns with worsening shortness of breath.Cardiology consulted regarding plans for cath. Underwent cath yesterday noted above.   Assessment & Plan    1. Acute systolic HF: Noted during her  admission last week. Cardiology was consulted last week and recommended that she stay over the weekend for cardiac cath on Monday. She left AMA, and presented back 3/12 with worsening shortness of breath. Does have CRFs including long hx of tobacco use and hyperlipidemia but reports she has been on medications intermittently. Underwent cath yesterday with nonobstructive CAD with mild pulmonary HTN.  -- consider adding coreg, but will need to be cautious given her hx of COPD. May be a good candidate for Entresto in the future if renal function stabilizes. For now would avoid adding ACEi/ARB given renal dysfunction.  -- IV lasix today with plans to transition to oral in the am  2. AKI vs CKD: unknown baseline but Cr increased from 1.52>> 1.7>>1.3 today.   3. Hyponatremia:128>>128. Suspect her volume overload could be playing a role in this.   4. HL: on statin.  -- have ordered lipid panel 2 days in a row, but has been cancelled. Will reorder again for the am.   5. ?CAP:was started on antibiotics but and felt to be 2/2 to COPD excerebration. Afebrile, WBC trending back down. Management per primary  6. COPD/Tobacco GGY:IRSW inhalers at home. Ongoing tobacco use.    Signed, Reino Bellis, NP  05/28/2017, 9:36 AM  Pager # [phone]   For questions or updates, please contact [place] Please consult [email or web address] for contact info under Cardiology/STEMI.   Agree with note by Reino Bellis NP-C  Status post cardiac catheterization yesterday by Dr. [place] revealing noncritical CAD with LV dysfunction. She has a nonischemic cardiomyopathy. Agree with carvedilol starting at low doses especially given her COPD. She may not be a candidate at this point for ACE/ARB given moderate renal insufficiency in addition to [place]. She does not appear to be volume overloaded. Continue current medical therapy. We'll be happy to see back as an outpatient. We'll sign off for now.  Lorretta Harp,  M.D., [place], [facility], [place] [address] [place], [place]  [address]  [phone] 05/28/2017 10:47 AM

## 2017-05-28 NOTE — Consult Note (Addendum)
   [facility] CM Inpatient Consult   05/28/2017  Shawna Hawkins Apr 16, 1945 [id number]    Referral received from inpatient RNCM for [place] Management program.  Spoke with Shawna Hawkins at bedside.  She endorses she lives alone and has very little support. Uses transportation service thru Medicare/Medicaid. Denies having concerns with obtaining medications. States she does not have a scale. Newly diagnosed with CHF. Primary Care Provider is with [facility].  Explained [place] Management program services. [facility] Care Management written consent obtained. [facility] Care Management folder provided as well. However, patient is eligible for [place] program. She may benefit from [facility] First due to limited support, weakness, and lives alone.   Tommi Rumps with [facility] spoke briefly with patient about the [place] program. Patient became fatigued and asked that . Shawna Hawkins with [facility] come back at later time to discuss program in detail.   Will send notification to [place] Management office if patient enrolls in [place].  If patient does not enroll in [place], Probation officer will make referral to [facility] for CHF disease management.   Spoke with [name] with [facility] and Suzi Roots. T, inpatient RNCM about all of the above notes.  Marthenia Rolling, MSN-Ed, RN,BSN [facility] [phone]

## 2017-05-28 NOTE — Progress Notes (Signed)
Medicine attending: I examined this patient today together with resident physician Dr. Rochele Pages and I concur with her evaluation and management plan which we discussed together. Patient is stable 24 hours post cardiac cath.  Nonobstructive lesions 50% circumflex, 30% LAD.  Medical treatment recommended. Renal function improved post precath hydration and ongoing post cath hydration.  Creatinine 1.4.  Peak 1.7. Exam with persistent JVD but lungs are clear, regular cardiac rhythm, no peripheral edema. Impression: New onset systolic congestive failure likely related to long-standing poorly controlled hypertension.  Ejection fraction 25-30%. We will optimize her medical regimen prior to discharge.  She is quite apprehensive about how she will do after she goes home.  We tried to reassure her we will try to support her.  She will continue to follow in our general medicine clinic.

## 2017-05-28 NOTE — Progress Notes (Signed)
   Subjective:  Patient seen and examined. She is doing well this morning. States she has acid refflux and would like something to relieve that. She also states she did not sleep well and was coughing a lot last night. Otherwise no acute complaints. She continues to endorse dyspnea on exertion.   Objective:  Vital signs in last 24 hours: Vitals:   05/27/17 1227 05/27/17 1400 05/27/17 2100 05/28/17 0537  BP: (!) 149/118 138/87 124/86 138/81  Pulse: 94 90 94 89  Resp: (!) 23 20 16 18   Temp: 98.2 F (36.8 C) 98.1 F (36.7 C) 98.2 F (36.8 C) 98 F (36.7 C)  TempSrc: Oral Oral Oral Oral  SpO2: 97% 96% 100% 100%  Weight:    100 lb 15.5 oz (45.8 kg)  Height:       General: Laying in bed comfortably, NAD HEENT: [place]/AT, no scleral icterus; no JVD Cardiac: RRR, No R/M/G appreciated Pulm: normal effort, no respiratory distress; lungs CTAB Abd: soft, non tender, non distended, BS normal Ext: extremities well perfused, no peripheral edema Neuro: alert and oriented X3, cranial nerves II-XII grossly intact   Assessment/Plan:  Active Problems:   CHF (congestive heart failure) (HCC)   HTN (hypertension), benign   Secondary cardiomyopathy (HCC)   Pressure injury of skin   Coronary artery disease involving native coronary artery of native heart without angina pectoris  Acute Systolic and Diastolic Heart Failure Patient presented with DOE and orthopnea. CXR with evidence of pulmonary edema and bilateral pleural effusions. ECHO on 3/8 revealed LVEF 25-30%, global hypokinesis, mild LVH, and grade 1 DD. Euvolemic on examination, Creatinine improved to 1.36. Patient remains net negative -624.2 cc since admission. She received about 540 cc of IVF yesterday prior to cath. Recorded output of 500 cc. Did received IV lasix yesterday due to creatinine. -IV Lasix 40 mg today -plans to transition to PO tomorrow -Strict I/Os -Daily weights -will monitor creatinine and lytes with daily bmets  -PT to  eval  -ambulatory pulse ox -wean oxygen as tolerated  Nonobstructive CAD Prox LAD lesion is 30% stenosed; Prox Cx lesion is 55% stenosed. Cards recommends medical management.  -ASA 81 mg daily -Continue atorvastatin 40mg  daily   AKI vs CKD III  Cr improved today to 1.36. Previous baseline of 0.8-1.0 in 2014, so possible that this is the patient's new baseline. If AKI likely cardiorenal.  -Will monitor with daily BMETs -Avoid nephrotoxic agents  Hyponatremia Secondary to hypervolemia.  -Will continue diuresis with IV lasix 40 mg daily to improve volume status -will monitor daily with BMETs  Generalized anxiety disorder - Continue home xanax 1mg  daily PRN - Continue home fluoxetine 20mg  daily  GERD -protonix 40 mg daily  DVT ppx: lovenox  Dispo: Anticipated discharge in approximately 1-2 day(s).   Melanee Spry, MD 05/28/2017, 8:56 AM Pager: [phone]

## 2017-05-28 NOTE — Evaluation (Signed)
Physical Therapy Evaluation Patient Details Name: Shawna Hawkins MRN: [id number] DOB: 07-Oct-1945 Today's Date: 05/28/2017   History of Present Illness   Pt is a 72 y.o. female with PMH of COPD, anxiety, and breast Ca with lumpectomy. She was recently admitted and found to elevated troponins and new EF of 25%. It was recommended to have a cath but pt left AMA. She returned with worsening shortness of breath and underwent cardiac cath 05-27-17.     Clinical Impression  Pt admitted with above diagnosis. Pt currently with functional limitations due to the deficits listed below (see PT Problem List). PTA, pt lived alone, independent with functional mobility. No AD needed for ambulation. On eval, pt required supervision bed mobility and min guard assist transfers. Pt agitated with c/o headache and acid reflex. Declining ambulation.  Pt on 2 L O2 with SpO2 99%.  It is the opinion of this therapist that she will progress quickly with mobility. Pt will benefit from skilled PT to increase their independence and safety with mobility to allow discharge to the venue listed below.       Follow Up Recommendations Home health PT;Supervision - Intermittent    Equipment Recommendations  None recommended by PT    Recommendations for Other Services       Precautions / Restrictions Precautions Precautions: Fall;Other (comment) Precaution Comments: watch sats      Mobility  Bed Mobility Overal bed mobility: Needs Assistance Bed Mobility: Supine to Sit;Sit to Supine     Supine to sit: Supervision;HOB elevated Sit to supine: HOB elevated;Supervision   General bed mobility comments: supervision for safety. No physical assist. +rail  Transfers Overall transfer level: Needs assistance Equipment used: None Transfers: Sit to/from [facility] Sit to Stand: Min guard Stand pivot transfers: Min guard       General transfer comment: increased time, min guard to stabilize  balance  Ambulation/Gait             General Gait Details: Pt declined.  Stairs            Wheelchair Mobility    Modified Rankin (Stroke Patients Only)       Balance                                             Pertinent Vitals/Pain Pain Assessment: Faces Faces Pain Scale: Hurts a little bit Pain Location: headache Pain Descriptors / Indicators: Headache Pain Intervention(s): Limited activity within patient's tolerance;Monitored during session    Home Living Family/patient expects to be discharged to:: Private residence Living Arrangements: Alone Available Help at Discharge: Family;Available PRN/intermittently;Friend(s) Type of Home: Apartment Home Access: Level entry     Home Layout: One level Home Equipment: Cane - single point      Prior Function Level of Independence: Independent         Comments: family/friends drive her to the grocery store. She uses [employer] bus for doctor appts.      Hand Dominance        Extremity/Trunk Assessment   Upper Extremity Assessment Upper Extremity Assessment: Overall WFL for tasks assessed    Lower Extremity Assessment Lower Extremity Assessment: Overall WFL for tasks assessed    Cervical / Trunk Assessment Cervical / Trunk Assessment: Kyphotic  Communication   Communication: No difficulties  Cognition Arousal/Alertness: Awake/alert Behavior During Therapy: Agitated Overall Cognitive Status: Within Functional Limits for  tasks assessed                                        General Comments      Exercises     Assessment/Plan    PT Assessment Patient needs continued PT services  PT Problem List Decreased mobility;Decreased activity tolerance;Cardiopulmonary status limiting activity;Pain       PT Treatment Interventions Therapeutic activities;Gait training;Therapeutic exercise;Patient/family education;Stair training;Balance training;Functional mobility  training    PT Goals (Current goals can be found in the Care Plan section)  Acute Rehab PT Goals Patient Stated Goal: not stated PT Goal Formulation: With patient Time For Goal Achievement: 06/11/17 Potential to Achieve Goals: Good    Frequency Min 3X/week   Barriers to discharge Decreased caregiver support      Co-evaluation               AM-PAC PT "6 Clicks" Daily Activity  Outcome Measure Difficulty turning over in bed (including adjusting bedclothes, sheets and blankets)?: None Difficulty moving from lying on back to sitting on the side of the bed? : A Little Difficulty sitting down on and standing up from a chair with arms (e.g., wheelchair, bedside commode, etc,.)?: A Little Help needed moving to and from a bed to chair (including a wheelchair)?: A Little Help needed walking in hospital room?: A Little Help needed climbing 3-5 steps with a railing? : A Lot 6 Click Score: 18    End of Session Equipment Utilized During Treatment: Oxygen Activity Tolerance: Treatment limited secondary to agitation Patient left: in bed;with call bell/phone within reach Nurse Communication: Mobility status PT Visit Diagnosis: Difficulty in walking, not elsewhere classified (R26.2)    Time: 1117-1130 PT Time Calculation (min) (ACUTE ONLY): 13 min   Charges:   PT Evaluation $PT Eval Low Complexity: 1 Low     PT G Codes:        Lorrin Goodell, PT  Office # [phone] Pager [phone]   Lorriane Shire 05/28/2017, 12:46 PM

## 2017-05-28 NOTE — Care Management Note (Signed)
Case Management Note  Patient Details  Name: Shawna Hawkins MRN: [id number] Date of Birth: [birth year]  Subjective/Objective:    From home alone, pta indep,  Presents with CHF, she did not use any DME at home.  She has PCP and she has medication coverage.  Medicare ride transport to [place] apts.  She states she also can use [employer], but she states she will have a ride home at discharge.  She is s/p cath today.  Patient is interested in [facility] services, NCM offered choice from [facility], she states she would like for me to try [facility] if she needs [facility] services at discharge.  Await PT eval.    3/15 1448 Tomi Bamberger RN, BSN - NCM spoke with Orrin Brigham and Tommi Rumps with [facility] about [facility] First program for patient, she is a candidate and Tommi Rumps spoke with her will plan for Home First Program for patient will need HHRN, PT, Aide, OT and Social Work orders                   Action/Plan: DC home with Home First Program with [place] when medically ready.  Expected Discharge Date:                  Expected Discharge Plan:  [place]  In-House Referral:     Discharge planning Services  CM Consult  Post Acute Care Choice:  Home Health Choice offered to:  Patient  DME Arranged:    DME Agency:     HH Arranged:  RN, PT, OT, Nurse's Aide, Social Work [employer] Agency:  [place]  Status of Service:  Completed, signed off  If discussed at [employer] of Stay Meetings, dates discussed:    Additional Comments:  Zenon Mayo, RN 05/28/2017, 2:50 PM

## 2017-05-29 DIAGNOSIS — Z9981 Dependence on supplemental oxygen: Secondary | ICD-10-CM

## 2017-05-29 LAB — LIPID PANEL
CHOLESTEROL: 223 mg/dL — AB (ref 0–200)
HDL: 21 mg/dL — AB (ref >40)
LDL Cholesterol: 172 mg/dL — ABNORMAL HIGH (ref 0–99)
Total CHOL/HDL Ratio: 10.6 RATIO
Triglycerides: 148 mg/dL (ref <150)
VLDL: 30 mg/dL (ref 0–40)

## 2017-05-29 LAB — BASIC METABOLIC PANEL
ANION GAP: 12 (ref 5–15)
BUN: 21 mg/dL — ABNORMAL HIGH (ref 6–20)
CALCIUM: 8.5 mg/dL — AB (ref 8.9–10.3)
CO2: 27 mmol/L (ref 22–32)
Chloride: 89 mmol/L — ABNORMAL LOW (ref 101–111)
Creatinine, Ser: 1.4 mg/dL — ABNORMAL HIGH (ref 0.44–1.00)
GFR calc Af Amer: 43 mL/min — ABNORMAL LOW (ref >60)
GFR, EST NON AFRICAN AMERICAN: 37 mL/min — AB (ref >60)
GLUCOSE: 128 mg/dL — AB (ref 65–99)
POTASSIUM: 2.7 mmol/L — AB (ref 3.5–5.1)
SODIUM: 128 mmol/L — AB (ref 135–145)

## 2017-05-29 LAB — CBC
HCT: 33.8 % — ABNORMAL LOW (ref 36.0–46.0)
Hemoglobin: 11.6 g/dL — ABNORMAL LOW (ref 12.0–15.0)
MCH: 31.6 pg (ref 26.0–34.0)
MCHC: 34.3 g/dL (ref 30.0–36.0)
MCV: 92.1 fL (ref 78.0–100.0)
PLATELETS: 181 10*3/uL (ref 150–400)
RBC: 3.67 MIL/uL — AB (ref 3.87–5.11)
RDW: 13.9 % (ref 11.5–15.5)
WBC: 13.6 10*3/uL — AB (ref 4.0–10.5)

## 2017-05-29 LAB — MAGNESIUM: MAGNESIUM: 2 mg/dL (ref 1.7–2.4)

## 2017-05-29 MED ORDER — ALUM & MAG HYDROXIDE-SIMETH 200-200-20 MG/5ML PO SUSP: 15.0000 mL | Freq: Four times a day (QID) | ORAL | 0 refills | Status: AC | PRN

## 2017-05-29 MED ORDER — POTASSIUM CHLORIDE 20 MEQ/15ML (10%) PO SOLN
40.0000 meq | Freq: Three times a day (TID) | ORAL | Status: AC
  Administered 2017-05-29 (×3): 40 meq via ORAL
  Filled 2017-05-29 (×3): qty 30

## 2017-05-29 MED ORDER — CARVEDILOL 3.125 MG PO TABS: 3.1250 mg | ORAL_TABLET | Freq: Two times a day (BID) | ORAL | 0 refills | Status: DC

## 2017-05-29 MED ORDER — ATORVASTATIN CALCIUM 40 MG PO TABS: 80.0000 mg | ORAL_TABLET | Freq: Every day | ORAL | 0 refills | Status: DC

## 2017-05-29 MED ORDER — ENSURE ENLIVE PO LIQD
237.0000 mL | Freq: Two times a day (BID) | ORAL | Status: DC
  Administered 2017-05-29: 237 mL via ORAL

## 2017-05-29 MED ORDER — FUROSEMIDE 20 MG PO TABS: 20.0000 mg | ORAL_TABLET | Freq: Every day | ORAL | 1 refills | Status: DC

## 2017-05-29 MED ORDER — MAGNESIUM SULFATE 2 GM/50ML IV SOLN
2.0000 g | Freq: Once | INTRAVENOUS | Status: AC
  Administered 2017-05-29: 2 g via INTRAVENOUS
  Filled 2017-05-29: qty 50

## 2017-05-29 MED ORDER — FUROSEMIDE 20 MG PO TABS: 20.0000 mg | ORAL_TABLET | Freq: Every day | ORAL | 1 refills | Status: AC

## 2017-05-29 MED ORDER — POTASSIUM CHLORIDE ER 10 MEQ PO TBCR: 10.0000 meq | EXTENDED_RELEASE_TABLET | Freq: Every day | ORAL | 1 refills | Status: DC

## 2017-05-29 MED ORDER — POTASSIUM CHLORIDE CRYS ER 20 MEQ PO TBCR: 40.0000 meq | EXTENDED_RELEASE_TABLET | Freq: Three times a day (TID) | ORAL | Status: DC

## 2017-05-29 MED ORDER — ASPIRIN 81 MG PO CHEW: 81.0000 mg | CHEWABLE_TABLET | Freq: Every day | ORAL | 0 refills | Status: DC

## 2017-05-29 NOTE — Discharge Summary (Signed)
Name: Shawna Hawkins MRN: [id number] DOB: 1945/11/10 72 y.o. PCP: Shela Leff, MD  Date of Admission: 05/25/2017  3:36 PM Date of Discharge: 05/29/17 Attending Physician: Aldine Contes, MD  Discharge Diagnosis: 1. Acute Combined CHF with volume overload 2. Reduced LVEF 25% 3. Non-obstructive CAD  4. HTN 5. Hyponatremia 6. Hypokalemia 7. AKI on CKD 8. GAD 9. Tobacco abuse  Active Problems:   CHF (congestive heart failure) (HCC)   HTN (hypertension), benign   Secondary cardiomyopathy (HCC)   Pressure injury of skin   Coronary artery disease involving native coronary artery of native heart without angina pectoris  Discharge Medications: Allergies as of 05/29/2017      Reactions   Codeine Sulfate Nausea Only   Hydroxyzine Hcl    Oxycodone-acetaminophen Nausea Only   Oxycodone-aspirin Nausea Only   Penicillins Itching   Has patient had a PCN reaction causing immediate rash, facial/tongue/throat swelling, SOB or lightheadedness with hypotension: unknown Has patient had a PCN reaction causing severe rash involving mucus membranes or skin necrosis: unknown Has patient had a PCN reaction that required hospitalization: no Has patient had a PCN reaction occurring within the last 10 years: no If all of the above answers are "NO", then may proceed with Cephalosporin use.      Medication List    TAKE these medications   ALKA-SELTZER PLS SINUS & COUGH 10-5-325 MG Caps Generic drug:  DM-Phenylephrine-Acetaminophen Take 1 capsule by mouth as needed (for cough and congestion).   ALPRAZolam 1 MG tablet Commonly known as:  XANAX Take 1 tablet by mouth once daily as needed. You may take 1 tablet twice daily as needed on days you have more anxiety. Pharmacy - please fill 30 days from last dispense date. What changed:    how much to take  how to take this  when to take this  additional instructions   alum & mag hydroxide-simeth [phone] MG/5ML suspension Commonly  known as:  MAALOX/MYLANTA Take 15 mLs by mouth every 6 (six) hours as needed for indigestion, heartburn or flatulence.   aspirin 81 MG chewable tablet Chew 1 tablet (81 mg total) by mouth daily.   atorvastatin 40 MG tablet Commonly known as:  LIPITOR Take 2 tablets (80 mg total) by mouth daily. What changed:  how much to take   carvedilol 3.125 MG tablet Commonly known as:  COREG Take 1 tablet (3.125 mg total) by mouth 2 (two) times daily with a meal.   FLUoxetine 20 MG capsule Commonly known as:  PROZAC Take 1 capsule (20 mg total) by mouth daily.   furosemide 20 MG tablet Commonly known as:  LASIX Take 1 tablet (20 mg total) by mouth daily.   omeprazole 40 MG capsule Commonly known as:  PRILOSEC Take 1 capsule (40 mg total) by mouth daily.   potassium chloride 10 MEQ tablet Commonly known as:  K-DUR Take 1 tablet (10 mEq total) by mouth daily.   triamcinolone 55 MCG/ACT Aero nasal inhaler Commonly known as:  NASACORT Place 2 sprays into the nose daily.       Disposition and follow-up:   Shawna Hawkins was discharged from [facility] in Stable condition.  At the hospital follow up visit please address:  1.  HF: Please assess volume status and compliance with both lasix and potassium supplementation. Her weight at time of discharge was 98 lbs and was felt to be euvolemic. She does not seem to present with peripheral edema so evaluation of respiratory status is emphasized.  CAD: Ensure compliance with daily ASA, atorvastatin HTN: She was started on Coreg 3.125mg  BID during hospitalization and seemed to tolerate well. Emphasize importance of medication compliance as it relates to heart health.  Hyponatremia, Hypokalemia: Hyponatremia felt to be secondary to volume overload initially however suspect SIADH related to SSRI could be contributing. Outpatient work-up per primary physician. Hypokalemia due to diuresis.  GAD: She has a significant underlying  anxiety disorder for which she takes Xanax and Prozac. She would benefit from referral to counseling.  Tobacco use: Ensure patient continues to abstain from cigarettes.   2.  Labs / imaging needed at time of follow-up: CMET (Na, K, Cr, BUN, LFTs). Weight.  3.  Pending labs/ test needing follow-up: None  Follow-up Appointments: Follow-up Information    [facility] Follow up.   Specialty:  Home Health Services Why:  [facility], PT, OT, Aide and Social Worker Contact information: [address] [place] [address] [phone]        Shela Leff, MD. Schedule an appointment as soon as possible for a visit in 1 week(s).   Specialty:  Internal Medicine Contact information: [place] [id number] [phone]        Jerline Pain, MD .   Specialty:  Cardiology Contact information: [phone] [address] [address] [phone]          Hospital Course by problem list: Active Problems:   CHF (congestive heart failure) (HCC)   HTN (hypertension), benign   Secondary cardiomyopathy ([place])   Pressure injury of skin   Coronary artery disease involving native coronary artery of native heart without angina pectoris   Acute on Chronic Combined Systolic and Diastolic Congestive Heart Failure NICM Ms. Shawna Hawkins is a 72 y/o F with history of uncontrolled HTN, COPD with 2-3L O2 continuously, ongoing tobacco use, medication non-compliance and history of breast cancer who presented 3/12 for re-evaluation of shortness of breath. She was initially admitted 3/8 with several month history of progressive SOB. She was found to have acute pulmonary edema and was diuresed with IV lasix. ECHO showed dilated cardiomyopathy with LVEF 25%. She was seen by cardiology who arranged for [facility] however patient left AMA 3/9 prior to this procedure due to anxiety. She presented again 3/12 with recurrent dyspnea and agreed to stay for further work-up.   She underwent both RHC/LHC which showed non-obstructive CAD with 30% stenosis of proximal LAD and 55% stenosis of proximal Cx. She was found to have mild pulmonary hypertension and medical management was advised. She received several days of diureses and eventually was felt to be euvolemic at weight of 98 lbs. She was started on Coreg 3.125mg , ASA, Atorvastatin and diuretics during her hospitalization and tolerated well. ACE-I was not initiated due to her renal function. She will establish with cardiology out patient.   Non-obstructive CAD Hyperlipidemia LDL 172. Started on ASA and high-intensity statin  AKI vs CKD Creatinine elevated during admission with max elevation 1.7. She had 2 CTAs within 4 days. Juidicious contrast was used during heart catheterizations and at time of DC her Cr and returned to BL. Prior BL about 5 years ago ~1. At time of discharge her creatinine had remained steadily near 1.3, which is likely her new baseline.   Hyponatremia Initially thought to be related to volume overload however sodium 128 while euvolemic. She does take SSRI chronically for her psychiatric conditions, which could be contributing. She was instructed to follow this up  with her PCP, with further work-up at their discretion.   Hypokalemia She required potassium supplementation throughout her hospitalization. Attempted aggressive repletion with IV K however not tolerated by patient. She was discharged home with K-dur 1mEq daily.   Generalized Anxiety Disorder Patient was extremely anxious throughout admission due to her underlying GAD. She was continued on her home Xanax regimen and also Fluoxetine.  Discharge Vitals:   BP 102/71   Pulse 85   Temp 97.9 F (36.6 C) (Oral)   Resp 20   Ht 5\' 3"  (1.6 m)   Wt 98 lb 1.7 oz (44.5 kg)   LMP 10/23/1982   SpO2 96%   BMI 17.38 kg/m   Pertinent Labs, Studies, and Procedures:  05/27/17: RIGHT/LEFT HEART CATH AND CORONARY ANGIOGRAPHY     Prox LAD  lesion is 30% stenosed.  Prox Cx lesion is 55% stenosed.  Hemodynamic findings consistent with mild pulmonary hypertension.  LV end diastolic pressure is normal.   1. Nonobstructive CAD. Left dominant circulation 2. Mild pulmonary HTN 3. Normal LV filling pressures.   Plan: medical management.   ECHO 05/21/17 Impressions: LVEF 25-30%, global hypokinesis, mild LVH, grade 1 DD, markedly   elevated LV filling pressure, aortic valve sclerosis, moderate MR   with poor leaflet excursion, mild LAE, mild TR, RVSP 55 mmHg,   dilated IVC.   Ref. Range 05/29/2017 03:05  Total CHOL/HDL Ratio Latest Units: RATIO 10.6  Cholesterol Latest Ref Range: 0 - 200 mg/dL 223 (H)  HDL Cholesterol Latest Ref Range: >40 mg/dL 21 (L)  LDL (calc) Latest Ref Range: 0 - 99 mg/dL 172 (H)  Triglycerides Latest Ref Range: <150 mg/dL 148  VLDL Latest Ref Range: 0 - 40 mg/dL 30  D-dimer 8.75 CTA 05/25/17:  IMPRESSION: 1. No CT evidence of acute or occlusive pulmonary artery embolus. 2. Cardiomegaly with evidence of right heart dysfunction and findings of CHF including small bilateral pleural effusions. Clinical correlation is recommended. 3. Aortic Atherosclerosis (ICD10-I70.0) and Emphysema (ICD10-J43.9).  SignedEinar Gip, DO 05/29/2017, 1:10 PM   Pager: [phone]

## 2017-05-29 NOTE — Progress Notes (Signed)
CRITICAL VALUE ALERT  Critical Value:  Potassium 2.7 Date & Time Notied:  0520  Provider Notified: IMTS notified  Orders Received/Actions taken:

## 2017-05-29 NOTE — Progress Notes (Addendum)
   Subjective:  Patient seen and examined at bedside. She is doing "meh" today but didn't elaborate much further. Patient and RN not she was able to ambulate yesterday without issue. Complains of GI upset after K-dur.  Objective:  Vital signs in last 24 hours: Vitals:   05/28/17 0537 05/28/17 1550 05/28/17 2000 05/29/17 0553  BP: 138/81 121/69  116/79  Pulse: 89 88 88 85  Resp: 18 (!) 22 20   Temp: 98 F (36.7 C) 98 F (36.7 C) 98.9 F (37.2 C) 97.9 F (36.6 C)  TempSrc: Oral Oral Oral Oral  SpO2: 100% 96% 95% 96%  Weight: 100 lb 15.5 oz (45.8 kg)   98 lb 1.7 oz (44.5 kg)  Height:       General: Chronically-ill female laying in bed, NAD but with depressed affect HEENT: [place]/AT, no scleral icterus; no JVD Cardiac: RRR, No R/M/G appreciated Pulm: normal effort, no respiratory distress; lungs CTAB Abd: soft, non tender, non distended, BS normal Ext: extremities well perfused, no peripheral edema Neuro: alert and oriented X3, cranial nerves II-XII grossly intact  Assessment/Plan:  Active Problems:   CHF (congestive heart failure) (HCC)   HTN (hypertension), benign   Secondary cardiomyopathy (HCC)   Pressure injury of skin   Coronary artery disease involving native coronary artery of native heart without angina pectoris  Acute Systolic and Diastolic Heart Failure Weight today 98 lbs and she appears euvolemic on examination. She was net neg 1.1 L yesterday on IV lasix 40mg . Low-dose coreg initiated yesterday and seems to be tolerating well. Cr near baseline at 1.4 and is improved from admit. I suspect patient can be transitioned to PO regimen for maintenance with close follow-up in [facility].  -Cardiology signed off, outpatient follow-up -Ambulate with pulse ox. She requires 2-3L at baseline.  -DC home with Lasix 40mg  daily   Nonobstructive CAD HLD Prox LAD lesion is 30% stenosed; Prox Cx lesion is 55% stenosed. Cards recommends medical management. Lipid panel returned today with  total cholesterol 223 and LDL 172. She was started on Atorvastatin 40mg  this admission which will be continued. She should have follow-up with her PCP in the next few weeks to gauge her response and possibly increase if needed. -ASA 81 mg daily -Continue atorvastatin 40mg  daily  AKI vs CKD III  Cr improved and seems to be at BL. -Avoid nephrotoxic agents  Hyponatremia Hypokalemia K 2.7 today, replaced with PO 40 mEq x3. Her sodium remains low and was initially thought to be secondary to hypervolemia. I would have expected her sodium to improve now that she is nearing euvolemia. I do see that she is on an SSRI chronically which can be associated with SIADH. Could consider taper off to maybe Buproprion outpatient. -Follow-up lytes at [facility] appointment  Generalized anxiety disorder - Continue home xanax 1mg  daily PRN - Continue home fluoxetine 20mg  daily  GERD Protonix 40 mg daily  DVT ppx: lovenox  Dispo: Anticipated discharge today.  Suzette Flagler, DO 05/29/2017, 6:38 AM Pager: [phone]

## 2017-05-29 NOTE — Progress Notes (Signed)
05/29/2017 Patient D/C today, went over AVS, medications and appointments. Patient was told to to pick medication up today at her pharmacy. [facility] RN.

## 2017-05-29 NOTE — Progress Notes (Addendum)
05/29/2017 patient ambulate in the hallway on 2 Liters of oxygen and saturation was 96%. Patient stated she was having shortness of breath. Rico Sheehan RN

## 2017-05-29 NOTE — Progress Notes (Signed)
Patient at rest on R/A was 93% and ambulate In the all was she was 94%.  Patient continue saying she was having shortness of breath [facility].

## 2017-05-29 NOTE — Progress Notes (Signed)
Internal Medicine Attending:   I saw and examined the patient. I reviewed the resident's note and I agree with the resident's findings and plan as documented in the resident's note.  Patient denies any new complaints today.  She was able to ambulate with RN yesterday. She was initially admitted with an acute combined systolic and diastolic heart failure exacerbation.  Patient appears euvolemic today and denies any shortness of breath.  Her creatinine is near her baseline.  Cardiology signed off.  Patient is stable for discharge home today on oral Lasix.  Continue with medical management for her nonobstructive CAD with aspirin 81 mg and atorvastatin 40 mg.  No further work-up at this time.  Of note, patient appears to have persistent hyponatremia with sodium in the late 120s.  I suspect this is secondary to SIADH from her SSRI.  This can be tapered off as an outpatient and she can be initiated on bupropion if her PCP thinks this is appropriate.

## 2017-05-29 NOTE — Discharge Instructions (Signed)
Shawna Hawkins,   You were admitted with shortness of breath and volume overload. This is due to a condition called Congestive Heart Failure. Its a very common condition which means your heart does not pump as strongly as it used to. You had a heart catheterization which did not show significant blockages.  You were treated with a medicine called Lasix which removes excess fluid by urination. You will be discharged home with a maintenance dose of this medication to help prevent fluid reaccumulation.  Several medicines were added to your regimen to optimize your hearts function. Please take them as directed below: -Lasix 20mg  daily -Potassium 55mEq daily -Carvedilol (Coreg) 3.125mg  twice daily -Aspirin 81 mg  We have increased your Atorvastatin (Lipitor) as your cholesterol was high. Please take Atorvastatin 80mg .   Please follow-up with Dr. Marlowe Sax in the Internal Medicine Clinic next week to follow-up your breathing and electrolytes. You will also need to follow-up with cardiology.

## 2017-05-29 NOTE — Care Management (Signed)
Cory with [facility] notified that patient has d/c order.  [facility] will provide services today as needed.

## 2017-05-30 DIAGNOSIS — I5041 Acute combined systolic (congestive) and diastolic (congestive) heart failure: Secondary | ICD-10-CM | POA: Diagnosis not present

## 2017-05-30 DIAGNOSIS — I110 Hypertensive heart disease with heart failure: Secondary | ICD-10-CM | POA: Diagnosis not present

## 2017-05-31 ENCOUNTER — Other Ambulatory Visit: Payer: Self-pay | Admitting: *Deleted

## 2017-05-31 DIAGNOSIS — I110 Hypertensive heart disease with heart failure: Secondary | ICD-10-CM | POA: Diagnosis not present

## 2017-05-31 DIAGNOSIS — I5041 Acute combined systolic (congestive) and diastolic (congestive) heart failure: Secondary | ICD-10-CM | POA: Diagnosis not present

## 2017-05-31 NOTE — Consult Note (Addendum)
Made aware by Tommi Rumps with [facility] that Ms. Mierzejewski did indeed enroll in the [place] program over the weekend.  Writer previously discussed [place] [employer] program with Ms. Gilberto if transportation or pharmacy needs are identified while on the [place] program. [place] [employer] written consent was obtained at that time.  Asked Tommi Rumps to have [place] refer back to [place] [employer] post [facility] services.  Will make [place] [facility] office aware of [facility] enrollment.   Marthenia Rolling, MSN-Ed, RN,BSN [facility] Liaison [phone]

## 2017-06-01 DIAGNOSIS — I110 Hypertensive heart disease with heart failure: Secondary | ICD-10-CM | POA: Diagnosis not present

## 2017-06-01 DIAGNOSIS — I5041 Acute combined systolic (congestive) and diastolic (congestive) heart failure: Secondary | ICD-10-CM | POA: Diagnosis not present

## 2017-06-02 DIAGNOSIS — I110 Hypertensive heart disease with heart failure: Secondary | ICD-10-CM | POA: Diagnosis not present

## 2017-06-02 DIAGNOSIS — I5041 Acute combined systolic (congestive) and diastolic (congestive) heart failure: Secondary | ICD-10-CM | POA: Diagnosis not present

## 2017-06-03 DIAGNOSIS — I110 Hypertensive heart disease with heart failure: Secondary | ICD-10-CM | POA: Diagnosis not present

## 2017-06-03 DIAGNOSIS — I5041 Acute combined systolic (congestive) and diastolic (congestive) heart failure: Secondary | ICD-10-CM | POA: Diagnosis not present

## 2017-06-04 DIAGNOSIS — I110 Hypertensive heart disease with heart failure: Secondary | ICD-10-CM | POA: Diagnosis not present

## 2017-06-04 DIAGNOSIS — I5041 Acute combined systolic (congestive) and diastolic (congestive) heart failure: Secondary | ICD-10-CM | POA: Diagnosis not present

## 2017-06-08 ENCOUNTER — Other Ambulatory Visit: Payer: Self-pay | Admitting: Internal Medicine

## 2017-06-08 DIAGNOSIS — I5041 Acute combined systolic (congestive) and diastolic (congestive) heart failure: Secondary | ICD-10-CM | POA: Diagnosis not present

## 2017-06-08 DIAGNOSIS — I110 Hypertensive heart disease with heart failure: Secondary | ICD-10-CM | POA: Diagnosis not present

## 2017-06-10 DIAGNOSIS — I5041 Acute combined systolic (congestive) and diastolic (congestive) heart failure: Secondary | ICD-10-CM | POA: Diagnosis not present

## 2017-06-10 DIAGNOSIS — I110 Hypertensive heart disease with heart failure: Secondary | ICD-10-CM | POA: Diagnosis not present

## 2017-06-14 DIAGNOSIS — I110 Hypertensive heart disease with heart failure: Secondary | ICD-10-CM | POA: Diagnosis not present

## 2017-06-14 DIAGNOSIS — I5041 Acute combined systolic (congestive) and diastolic (congestive) heart failure: Secondary | ICD-10-CM | POA: Diagnosis not present

## 2017-06-15 DIAGNOSIS — I5041 Acute combined systolic (congestive) and diastolic (congestive) heart failure: Secondary | ICD-10-CM | POA: Diagnosis not present

## 2017-06-15 DIAGNOSIS — I110 Hypertensive heart disease with heart failure: Secondary | ICD-10-CM | POA: Diagnosis not present

## 2017-06-17 DIAGNOSIS — I110 Hypertensive heart disease with heart failure: Secondary | ICD-10-CM | POA: Diagnosis not present

## 2017-06-17 DIAGNOSIS — I5041 Acute combined systolic (congestive) and diastolic (congestive) heart failure: Secondary | ICD-10-CM | POA: Diagnosis not present

## 2017-06-24 ENCOUNTER — Other Ambulatory Visit: Payer: Self-pay | Admitting: Internal Medicine

## 2017-06-24 DIAGNOSIS — I110 Hypertensive heart disease with heart failure: Secondary | ICD-10-CM | POA: Diagnosis not present

## 2017-06-24 DIAGNOSIS — I5041 Acute combined systolic (congestive) and diastolic (congestive) heart failure: Secondary | ICD-10-CM | POA: Diagnosis not present

## 2017-07-01 ENCOUNTER — Telehealth: Payer: Self-pay | Admitting: Internal Medicine

## 2017-07-01 NOTE — Telephone Encounter (Signed)
Please contact patient, she is sick but wants to come in on Monday was are 115% booked on Monday and 100% Tuesday. Monday is the only day that she will have a ride, please call patient

## 2017-07-01 NOTE — Telephone Encounter (Signed)
RTC TO PT, she states she needs to come in Monday at 1315 to be seen and can only stay a short amt of time due to her friend not having time to spend. She is informed that there are no openings in [facility] Monday and is ask what problems she is having at this time, she states since "these care people came they caused me to get weak and made me do exercises that I was not suppose to do, they put weights on my legs and when I couldn't do it they pulled out also they called medicines in to the drug store not ordered by my doctor and made me take them all in the mornings at 1000. Then when I got worse they left, I am getting short of breath and cannot talk a lot." Triage spoke to her for 20 mins. Informed her she can come in mon at 1300 and wait to see if someone no shows she could take that slot but no guarantee she would be seen and imc may need to take her to ED. At this time she states she cannot do that and mentions she has never been refused an appt in 35 yrs, informed her she is not being refused an appt. Also went over her disch summary page by page, she states she was not to do any exercises at all, read to her that she was to stop anything that caused her discomfort of the chest. She states she will come 07/2017 for her appt or go to ed. We ended the call because someone was bringing her food

## 2017-07-03 NOTE — Telephone Encounter (Signed)
Thanks [employer]. I agree she needs to be seen.

## 2017-07-14 ENCOUNTER — Encounter: Payer: Self-pay | Admitting: *Deleted

## 2017-07-16 ENCOUNTER — Other Ambulatory Visit: Payer: Self-pay | Admitting: Internal Medicine

## 2017-07-17 ENCOUNTER — Other Ambulatory Visit: Payer: Self-pay | Admitting: Internal Medicine

## 2017-07-19 ENCOUNTER — Telehealth: Payer: Self-pay | Admitting: *Deleted

## 2017-07-19 NOTE — Telephone Encounter (Addendum)
Received PA request from pt's pharmacy for her atorvastatin 40mg  take 2 tabs daily.  Dose changed from 40mg  daily to 80mg  daily (see 05/29/17 d/c summary) and insurance inquiring about qty per mth.  PA submitted online via Cover My Meds-info sent for review.Regenia Skeeter, Darlene Cassady5/6/201911:20 AM   Of note, if request is denied, MD can write rx for Lipitor 80mg  tabs daily (no PA required)   Cover My Meds KEY [id number]

## 2017-07-27 ENCOUNTER — Encounter: Payer: Self-pay | Admitting: Internal Medicine

## 2017-07-28 ENCOUNTER — Other Ambulatory Visit: Payer: Self-pay | Admitting: Internal Medicine

## 2017-07-28 NOTE — Telephone Encounter (Signed)
Reviewed chart, patient very overdue for follow up after hospitalization for new onset CHF in march.  Appears appointment was canceled on our end yesterday and rescheduled for June 4th. Fortunately has appointment with cardiology next week.  Will refill this.  Will send note to PCP to follow up patients chart.

## 2017-08-01 ENCOUNTER — Other Ambulatory Visit: Payer: Self-pay | Admitting: Internal Medicine

## 2017-08-03 ENCOUNTER — Other Ambulatory Visit: Payer: Self-pay | Admitting: Internal Medicine

## 2017-08-04 ENCOUNTER — Ambulatory Visit: Payer: Medicare Other | Admitting: Cardiology

## 2017-08-06 NOTE — Telephone Encounter (Signed)
Left message on patient's VM requesting return call. Will need to schedule [facility] visit ASAP for potassium check. Hubbard Hartshorn, RN, BSN

## 2017-08-07 ENCOUNTER — Other Ambulatory Visit: Payer: Self-pay | Admitting: Internal Medicine

## 2017-08-10 NOTE — Telephone Encounter (Signed)
Attempted to contact patient, but no answer.  Left message asking to please call the clinic back to schedule an appointment.  Sending message back to pcp and triage pool.

## 2017-08-11 NOTE — Telephone Encounter (Signed)
Attempted to contact pt at number on chart ([phone])-no rings, went straight to voicemail "mailbox is full" unable to leave message.Despina Hidden Cassady5/29/201910:09 AM   Call made to emergency contact Daphene Jaeger, no answer, message left on recorder to have pt contact office ASAP.Regenia Skeeter, Darlene Cassady5/29/201910:14 AM

## 2017-08-11 NOTE — Telephone Encounter (Signed)
Received return call from emergency contact stating pt is now deceased.  States patient was found dead on 07/08/2022, but because she is not a blood relative, she was not given the details of her death.  I thanked Ms Rosana Hoes for letting me know, phone call complete, no further action needed at this time.Regenia Skeeter, Darlene Cassady5/29/201911:20 AM

## 2017-08-12 ENCOUNTER — Other Ambulatory Visit: Payer: Self-pay | Admitting: Internal Medicine

## 2017-08-12 NOTE — Telephone Encounter (Signed)
Next appt scheduled 6/4 with PCP. 

## 2017-08-13 ENCOUNTER — Ambulatory Visit: Payer: Medicare Other | Admitting: Cardiology

## 2017-08-14 DIAGNOSIS — 419620001 Death: Secondary | SNOMED CT | POA: Diagnosis not present

## 2017-08-14 DEATH — deceased

## 2017-08-17 ENCOUNTER — Encounter: Payer: Self-pay | Admitting: Internal Medicine
